# Patient Record
Sex: Female | Born: 1985 | Race: Black or African American | Hispanic: No | Marital: Married
Health system: Midwestern US, Academic
[De-identification: ages and names within clinical notes are randomized; demographics above are authoritative.]

## PROBLEM LIST (undated history)

## (undated) DIAGNOSIS — E119 Type 2 diabetes mellitus without complications: Secondary | ICD-10-CM

## (undated) DIAGNOSIS — Z9289 Personal history of other medical treatment: Secondary | ICD-10-CM

## (undated) DIAGNOSIS — F329 Major depressive disorder, single episode, unspecified: Secondary | ICD-10-CM

## (undated) DIAGNOSIS — D649 Anemia, unspecified: Secondary | ICD-10-CM

## (undated) DIAGNOSIS — Z8719 Personal history of other diseases of the digestive system: Secondary | ICD-10-CM

## (undated) DIAGNOSIS — J45909 Unspecified asthma, uncomplicated: Secondary | ICD-10-CM

## (undated) DIAGNOSIS — F32A Depression, unspecified: Secondary | ICD-10-CM

## (undated) DIAGNOSIS — F3111 Bipolar disorder, current episode manic without psychotic features, mild: Secondary | ICD-10-CM

## (undated) DIAGNOSIS — K219 Gastro-esophageal reflux disease without esophagitis: Secondary | ICD-10-CM

## (undated) DIAGNOSIS — F419 Anxiety disorder, unspecified: Secondary | ICD-10-CM

## (undated) LAB — HM PAP SMEAR: HM Pap smear: NORMAL

---

## 2010-07-30 HISTORY — PX: TUBAL LIGATION: SHX77

## 2012-02-04 ENCOUNTER — Inpatient Hospital Stay
Admit: 2012-02-04 | Discharge: 2012-02-04 | Disposition: A | Discharge status: Home / Self Care | Payer: PRIVATE HEALTH INSURANCE

## 2012-02-04 DIAGNOSIS — L509 Urticaria, unspecified: Secondary | ICD-10-CM

## 2012-02-04 MED ORDER — hydrOXYzine (ATARAX) 25 MG tablet
25 | ORAL_TABLET | ORAL | Status: AC | PRN
Start: 2012-02-04 — End: 2012-04-24

## 2012-02-04 MED ORDER — methylPREDNISolone (MEDROL, PAK,) 4 mg tablet
4 | PACK | ORAL | Status: AC
Start: 2012-02-04 — End: 2012-04-24

## 2012-02-04 MED ORDER — albuterol (PROVENTIL HFA) 90 mcg/actuation inhaler
90 | RESPIRATORY_TRACT | Status: AC | PRN
Start: 2012-02-04 — End: 2012-06-11

## 2012-02-04 NOTE — Unmapped (Signed)
Plan  Follow up with your PCP for re-evaluation as arranged. Take the medications as prescribed until gone. Read the handout provided. Return for any worsening symptoms.     Hives  (Urticaria)  Hives (urticaria) are itchy, red, swollen patches on the skin. They may change size, shape, and location quickly and repeatedly. Hives that occur deeper in the skin can cause swelling of the hands, feet, and face. Hives may be an allergic reaction to something you or your child ate, touched, or put on the skin. Hives can also be a reaction to cold, heat, viral infections, medication, insect bites, or emotional stress. Often the cause is hard to find. Hives can come and go for several days to several weeks. Hives are not contagious.  HOME CARE INSTRUCTIONS:  ?? If the cause of the hives is known, avoid exposure to that source.   ?? To relieve itching and rash:   ?? Apply cold compresses to the skin or take cool water baths. Do not take or give your child hot baths or showers because the warmth will make the itching worse.   ?? The best medicine for hives is an antihistamine. An antihistamine will not cure hives, but it will reduce their severity. You can use an antihistamine available over the counter, such as Benadryl??. This medicine may make your child sleepy. Teenagers should not drive while using this medicine.   ?? Take or give Benadryl?? every 6 hours until the hives are completely gone for 24 hours or as directed.   ?? Your child may have other medications prescribed for itching. Give these as directed by your child's physician.   ?? You or your child should wear loose fitting clothing, including undergarments. Skin irritations may make hives worse.   ?? Follow-up as provided by your caregiver.   SEEK MEDICAL CARE IF:  ?? You or your child still have considerable itching after taking the medication (prescribed or purchased over the counter).   ?? An oral temperature above 102 develops.   ?? Joint swelling or pain occurs.   SEEK  IMMEDIATE MEDICAL CARE IF:  ?? Swollen lips or tongue are noticed.   ?? There is difficulty with breathing, swallowing, or tightness in the throat or chest.   ?? Abdominal (belly) pain develops.   ?? Your child starts acting very sick.   These may be the first signs of a life-threatening allergic reaction. THIS IS AN EMERGENCY. Call 911 for medical help.  MAKE SURE YOU:   ?? Understand these instructions.   ?? Will watch your condition.   ?? Will get help right away if you are not doing well or get worse.   Document Released: 07/16/2005 Document Re-Released: 06/28/2008  ExitCare?? Patient Information ??2012 [place], [place].

## 2012-02-04 NOTE — Unmapped (Signed)
ED Attending Attestation Note    This patient was seen by the mid-level provider.  I have seen and examined the patient, agree with the workup, evaluation, management and diagnosis.  The care plan has been discussed and I concur.     Assessment reveals  Nodular areas of tenderness and redness, minimal warmth, excoriated.  Clear lungs bilaterally.

## 2012-02-04 NOTE — Unmapped (Signed)
Catherine Dominguez  eMERGENCY dEPARTMENT eNCOUnter        Chief Complaint   Shortness of Breath and Insect Bite      Nursing Notes, Past Medical Hx, Past Surgical Hx, Social Hx, Allergies, and Family Hx were all reviewed and agreed with, or any disagreements were addressed in the HPI.    History of Present Illness       Catherine Dominguez is a 26 y.o. female  She presents presents here today with a chief complaint of spider bite.  Patient stated she was on the floor last evening watching TV when she felt as though she got bit on multiple spots by some sort of insect which she believes was a spider.  Patient stated she woke up this morning and had a significant amount of pruritus on the right triceps, right wrist, and right lower extremity.  She has been scratching the area throughout the day.  She has had significant pruritus as stated above.  She has noticed some spread of one of the areas.  Otherwise she does not endorse run a fever.  She has never had a reaction like this.  She denies any new change in detergents, or environmental exposures.  She also has had a little bit of increase in shortness of breath.  She does have a history of asthma and has recently ran out of her albuterol inhaler.  She stated that minute changes in the environment will trigger asthma spell.  She does have followup with her PCP 1 week from today.    Past Medical History   Diagnosis Date   ??? Anxiety    ??? Asthma        No family history on file.    Past Surgical History   Procedure Date   ??? Tubal ligation        Review of Systems     Review of Systems   Constitutional: Negative for fever and chills.   HENT: Negative for neck pain and neck stiffness.    Eyes: Negative for visual disturbance.   Respiratory: Negative for cough and shortness of breath.    Cardiovascular: Negative for chest pain.   Gastrointestinal: Negative for nausea, vomiting, abdominal pain and diarrhea.   Genitourinary: Negative for flank pain.   Skin: Positive for rash.      Neurological: Negative for dizziness and headaches.   All other systems reviewed and are negative.          Medications     Previous Medications    ALBUTEROL (PROVENTIL,VENTOLIN) 90 MCG/ACTUATION INHALER    Inhale 2 puffs into the lungs every 4 hours as needed.    ESCITALOPRAM (LEXAPRO) 20 MG TABLET    Take 20 mg by mouth daily.       Allergies     She  has no known allergies.    Physical Exam      INITIAL VITALS: BP 111/72   Pulse 80   Temp 97.8 ??F (36.6 ??C) (Oral)   Resp 14   Ht 4' 11 (1.499 m)   Wt 194 lb (87.998 kg)   BMI 39.18 kg/m2   SpO2 96%   LMP 01/24/2012   Physical Exam   Constitutional: She is oriented to person, place, and time. She appears well-developed and well-nourished.   HENT:   Head: Normocephalic and atraumatic.   Eyes: Conjunctivae normal are normal.   Neck: Normal range of motion.   Neurological: She is alert and oriented to person, place, and time.  Skin: Skin is warm and dry.        Patient has an area of urticaria on the right triceps that has an erythema of about 5 cm in an oblong oval-shaped pattern.  There was minimal induration but no fluctuance.  Minimal warmth but I do believe secondary to coordination.  Area of the last-like lesions on the right forearm.  No surrounding erythema.  She also had an area very similar with hive-like: Since on the right lower extremity on the anterior shin.  No surrounding erythema.   Psychiatric: She has a normal mood and affect.         Diagnostic Results     EKG   None.    RADIOLOGY:  None    LABS:   Labs Reviewed - No data to display    RECENT VITALS:  BP: 111/72 mmHg, Temp: 97.8 ??F (36.6 ??C), Heart Rate: 80 , Resp: 14      ED Course       The patient was given the following medications:  No orders of the defined types were placed in this encounter.         Medical Decision Making      Catherine Dominguez is a 26 y.o. female presents today for evaluation of the chief complaint above.  At this time the patient has evidence of urticaria present on  the areas listed above.  There was some surrounding redness but did not appear to be cellulitic.  With the distribution being as stated above, I do not believe the patient has multiple sites of cellulitis.  There is also a pruritic component to this.  I do believe she has been exposed to some sort of specimen that has caused this urticaria.  She has a followup with her PCP next week.  I will highly encourage reevaluation if symptoms to consider she may need to see dermatology.  Otherwise I will treat her with a course of steroids.  If she develops any worsening symptoms she has been instructed to return.  At this time she will be discharged home in good condition.  Her vital signs have remained stable.  There was no involvement of the intraoral cavity and there has been no airway compromise.     The patient was seen and examined by myself and the attending physician who agrees with the assessment and plan.  Patient was given adequate time to ask questions and does verbalize agreement with the treatment plan.  Disposition     CLINICAL IMPRESSION:  1. Urticaria        PATIENT REFERRED TO:            DISCHARGE MEDICATIONS:  New Prescriptions    HYDROXYZINE (ATARAX) 25 MG TABLET    Take 1 tablet (25 mg total) by mouth every 3 hours as needed for Itching.    METHYLPREDNISOLONE (MEDROL, PAK,) 4 MG TABLET    follow package directions       PLAN:  Follow up with your PCP for re-evaluation as arranged. Take the medications as prescribed until gone. Read the handout provided. Return for any worsening symptoms.     DISPOSITION:  Discharged in good condition.      Catherine Dominguez  [facility]      Catherine Dominguez, [place]  02/04/12 [phone]

## 2012-02-04 NOTE — Unmapped (Signed)
PA at bedside.

## 2012-02-04 NOTE — Unmapped (Signed)
Pt with worsen sob x 1 week.  ? Insect bite to right arm and led last night. +redness, +drainage

## 2012-04-24 ENCOUNTER — Ambulatory Visit: Admit: 2012-04-24 | Discharge: 2012-04-24 | Payer: PRIVATE HEALTH INSURANCE | Attending: Adult Health

## 2012-04-24 DIAGNOSIS — R7309 Other abnormal glucose: Secondary | ICD-10-CM

## 2012-04-24 DIAGNOSIS — J45909 Unspecified asthma, uncomplicated: Secondary | ICD-10-CM | POA: Insufficient documentation

## 2012-04-24 LAB — COMPREHENSIVE METABOLIC PANEL, SERUM
ALT: 33 U/L (ref 6–40)
AST (SGOT): 26 U/L (ref 14–36)
Albumin: 4 g/dL (ref 3.6–5.1)
Alkaline Phosphatase: 71 U/L (ref 33–115)
Anion Gap: 11 mmol/L (ref 3–16)
BUN: 11 mg/dL (ref 7–25)
CO2: 27 mmol/L (ref 21–33)
Calcium: 9.3 mg/dL (ref 8.6–10.2)
Chloride: 103 mmol/L (ref 98–110)
Creatinine: 0.78 mg/dL (ref 0.50–1.20)
GFR MDRD Af Amer: 108 See note.
GFR MDRD Non Af Amer: 89 See note.
Glucose: 101 mg/dL (ref 65–99)
Osmolality, Calculated: 292 mOsm/kg (ref 278–305)
Potassium: 4 mmol/L (ref 3.5–5.3)
Sodium: 141 mmol/L (ref 135–146)
Total Bilirubin: 0.2 mg/dL (ref 0.2–1.2)
Total Protein: 7 g/dL (ref 6.2–8.3)

## 2012-04-24 LAB — DIFFERENTIAL
Basophils Absolute: 30 /uL (ref 0–200)
Basophils Relative: 0.3 % (ref 0.0–1.0)
Eosinophils Absolute: 40 /uL (ref 15–500)
Eosinophils Relative: 0.4 % (ref 0.0–8.0)
Lymphocytes Absolute: 1757 /uL (ref 850–3900)
Lymphocytes Relative: 17.4 % (ref 15.0–45.0)
Monocytes Absolute: 323 /uL (ref 200–950)
Monocytes Relative: 3.2 % (ref 0.0–12.0)
Neutrophils Absolute: 7949 /uL (ref 1500–7800)
Neutrophils Relative: 78.7 % (ref 40.0–80.0)

## 2012-04-24 LAB — CBC
Hematocrit: 32.4 % (ref 35.0–45.0)
Hemoglobin: 9.8 g/dL (ref 11.7–15.5)
MCH: 21 pg (ref 27.0–33.0)
MCHC: 30.3 g/dL (ref 32.0–36.0)
MCV: 69.3 fL (ref 80.0–100.0)
MPV: 8.7 fL (ref 7.5–11.5)
Platelets: 530 10*3/uL (ref 140–400)
RBC: 4.68 10*6/uL (ref 3.80–5.10)
RDW: 21 % (ref 11.0–15.0)
WBC: 10.1 10*3/uL (ref 3.8–10.8)

## 2012-04-24 LAB — LIPID PANEL
Cholesterol, Total: 202 mg/dL (ref 0–200)
HDL: 43 mg/dL (ref 30–60)
LDL Cholesterol: 128 mg/dL (ref 0–160)
Triglycerides: 153 mg/dL (ref 10–150)

## 2012-04-24 LAB — VITAMIN D 25 HYDROXY: Vit D, 25-Hydroxy: 7.3 ng/mL — ABNORMAL LOW (ref 30.0–100)

## 2012-04-24 LAB — HEMOGLOBIN A1C: Hemoglobin A1C: 5.6 % (ref 4.8–6.4)

## 2012-04-24 MED ORDER — escitalopram (LEXAPRO) 20 MG tablet
20 | ORAL_TABLET | Freq: Every day | ORAL | 1.00 refills | 30.00000 days | Status: AC
Start: 2012-04-24 — End: 2012-11-17

## 2012-04-24 MED ORDER — parab-cety&stea alc-pro gl-sls (CETAPHIL) Clsr top cleanser
TOPICAL | 1.00 refills | 30.00000 days | Status: AC
Start: 2012-04-24 — End: 2013-05-04

## 2012-04-24 NOTE — Unmapped (Addendum)
Try Cetaphil lotion for dry skin      Continue current medication regime until lab results have been reviewed.  We will call you with the results and any changes in your medications if needed.

## 2012-04-24 NOTE — Unmapped (Signed)
Catherine Dominguez is a 26 y.o. female here for a physical exam and to establish care.  Recently relocated from the [place].  Reports family sleeps on the floor, no beds or other furniture.   CURRENT COMPLAINTS:     Eczema, asthma, anxiety and concern about blood glucose levels  CHRONIC MEDICAL PROBLEMS:  Patient Active Problem List   Diagnosis   ??? Asthma   ??? Eczema     HEALTH MAINTENANCE:  Reports up to date with gyn    Other Providers:   none  Past medical history, social history and family history reviewed and updated.    Current Outpatient Prescriptions   Medication Sig   ??? albuterol Inhale 2 puffs into the lungs every 4 hours as needed for Wheezing or Shortness of Breath.   ??? albuterol Inhale 2 puffs into the lungs every 4 hours as needed.   ??? fluticasone-salmeterol Inhale 1 puff into the lungs every 12 (twelve) hours.   ??? lansoprazole Take 15 mg by mouth every morning before breakfast.   ??? DISCONTD: escitalopram Take 20 mg by mouth daily.   ??? escitalopram Take 1 tablet (20 mg total) by mouth daily.   ??? parab-cety&stea alc-pro gl-sls Apply thin coat to affected areas three times a day   ??? DISCONTD: hydrOXYzine Take 1 tablet (25 mg total) by mouth every 3 hours as needed for Itching.   ??? DISCONTD: methylPREDNISolone follow package directions         History   Substance Use Topics   ??? Smoking status: Never Smoker    ??? Smokeless tobacco: Never Used   ??? Alcohol Use: No       ROS: No unusual headaches, visual disturbances or allergy symptoms.  No lymphadenopathy, fever or chills.  No dysphagia.  No prolonged cough, hemoptysis, wheezing or SOB. Reports asthma control with Adviar and albuterol.   No dyspnea, palpitations .or angina-like chest pain.  No orthopnea or PND.  No abdominal pain, changes in bowel habits, black or bloody stools.  No urinary tract symptoms.  Denies TIA-like symptoms  No new or unusual musculoskeletal  symptoms. Denies mastalgia, nipple discharge, dimpling or masses.  No asymmetry.  Denies  changes in menstrual pattern, flow, skipped periods, vaginal discharge or painful intercourse. Chronic eczema.  No skin rashes, mouth sores or troubling skin lesions.  No suicidal or homicidal ideations.  Current medications are listed above.  She denies any side effects from the medication.      Physical Exam:  Blood pressure 129/85, pulse 77, weight 202 lb (91.627 kg).    Weight: 202 lb (91.627 kg)   There is no height on file to calculate BMI.    Constitutional: Oriented to person, place and time.  Appears well-developed and well-nourished in no acute distress.  HENT:  Head:normocephalic and atraumatic  Ears:  Hearing, tympanic membranes, external ear and ear canal normal and intact.  Nose:  Normal  Mouth/Throat:  Uvula is midline, oropharynx is clear and moist  Mucous membranes normal.  Eyes:  Conjunctivae non-injected and EOMs intact.  PERRL.  No scleral icterus.  Neck: Normal range of motion.  Neck supple.  Normal carotid pulses and no JVD present.  No carotid bruits appreciated.  No tracheal deviation.  No mass and no thyromegaly.  Cardiovascular:  Normal rate, regular rhythm, S1:S2, No MGR.  Intact distal pulses.  No peripheral edema noted.  Pulmonary/Chest:  Effort normal.  No stridor.  No respiratory distress.  No wheezes, rhonchi or crackles.  No chest wall tenderness to light palpation.  Abdominal:  Soft, nontender, negative organomegaly.  No distension, rebound tenderness or guarding.  No masses or abdominal bruits.  No ventral or umbilical hernia appreciated.  Musculoskeletal:  Normal posture and balance.  Full ROM without soft tissue swelling, pain or crepitus.  Lymphadenopathy:  No cervical, axillary, supraclavicular or inguinal lymphadenopathy.  Neurological:  Alert and oriented to person, place and time.  Normal reflexes.  No tremor.  CN II-XII grossly intact.  Normal muscle tone.  Coordination and gait normal. Cerebellar function is intact, finger to nose is bilaterally symmetric.  Romberg is  negative.  Skin:  Skin is warm and dry.  No rash noted.  No cyanosis or pallor.  No worrisome skin lesions. Nails show no clubbing.  Psychiatric:  Normal mood and affect.  Speech is normal and behavior is normal.  Cognition and memory are normal.    Catherine Dominguez was seen today for new patient visit/ consultation.    Diagnoses and associated orders for this visit:    Elevated random blood glucose level  - Hemoglobin A1c  - Comprehensive Metabolic Panel, Serum  - CBC  - Differential    Asthma    Eczema  - parab-cety&stea alc-pro gl-sls (CETAPHIL) Clsr top cleanser; Apply thin coat to affected areas three times a day    Osteomalacia  - Vitamin D 25 hydroxy    Hyperlipidemia  - Lipid Profile    Anxiety  - escitalopram (LEXAPRO) 20 MG tablet; Take 1 tablet (20 mg total) by mouth daily.    Counseling nos  - AMB REFERRAL TO SOCIAL WORK    Other Orders  - fluticasone-salmeterol (ADVAIR) 250-50 mcg/dose diskus inhaler; Inhale 1 puff into the lungs every 12 (twelve) hours.  - lansoprazole (PREVACID) 15 MG capsule; Take 15 mg by mouth every morning before breakfast.      Have placed a social work referral to assist with her needs such as beds and other furniture.  See orders, patient instructions and/or after visit summary.      Age-appropriate preventative issues discussed and handout given.  See orders, patient instructions and/or after visit summary.

## 2012-04-25 MED ORDER — cholecalciferol, vitamin D3, 50,000 unit capsule
1250 | ORAL_CAPSULE | ORAL | Status: AC
Start: 2012-04-25 — End: 2012-11-17

## 2012-04-28 NOTE — Unmapped (Signed)
Patient returning call to Rosann Auerbach

## 2012-06-11 MED ORDER — albuterol (PROVENTIL HFA) 90 mcg/actuation inhaler
90 | RESPIRATORY_TRACT | Status: AC | PRN
Start: 2012-06-11 — End: 2012-12-23

## 2012-06-11 NOTE — Unmapped (Signed)
Pt called and stated that she had run out of the pro air, can this please be refilled?    [employer]  [phone]

## 2012-06-11 NOTE — Unmapped (Signed)
Phoned to pharmacy voicemail.

## 2012-06-17 ENCOUNTER — Encounter: Payer: PRIVATE HEALTH INSURANCE | Attending: Adult Health

## 2012-06-19 ENCOUNTER — Ambulatory Visit: Admit: 2012-06-19 | Discharge: 2012-06-19 | Payer: PRIVATE HEALTH INSURANCE | Attending: Adult Health

## 2012-06-19 DIAGNOSIS — R591 Generalized enlarged lymph nodes: Secondary | ICD-10-CM

## 2012-06-19 MED ORDER — cephALEXin (KEFLEX) 500 MG capsule
500 | ORAL_CAPSULE | Freq: Four times a day (QID) | ORAL | Status: AC
Start: 2012-06-19 — End: 2012-11-17

## 2012-06-19 NOTE — Unmapped (Signed)
We will call you with the results of the testing.

## 2012-06-19 NOTE — Unmapped (Signed)
Subjective  HPI:   Patient ID: Catherine Dominguez is a 26 y.o. female.    Chief Complaint:  HPI       She presents today for her annual gyn exam as well as with a several year history of axillary lymphadenopathy she attributes to using deodorant.  Reports a PAP smear about 18 months ago.  Says she has a PH of abnormal PAPs but the last one was fine    Medications:  Current Outpatient Prescriptions   Medication Sig Dispense Refill   ??? albuterol (PROVENTIL HFA) 90 mcg/actuation inhaler Inhale 2 puffs into the lungs every 4 hours as needed for Wheezing or Shortness of Breath.  1 Inhaler  0   ??? cholecalciferol, vitamin D3, 50,000 unit capsule Take 1 capsule (50,000 Units total) by mouth once a week. Take one capsule a week for 3 months.  12 capsule  0   ??? escitalopram (LEXAPRO) 20 MG tablet Take 1 tablet (20 mg total) by mouth daily.  90 tablet  1   ??? fluticasone-salmeterol (ADVAIR) 250-50 mcg/dose diskus inhaler Inhale 1 puff into the lungs every 12 (twelve) hours.       ??? lansoprazole (PREVACID) 15 MG capsule Take 15 mg by mouth every morning before breakfast.       ??? parab-cety&stea alc-pro gl-sls (CETAPHIL) Clsr top cleanser Apply thin coat to affected areas three times a day  480 mL  5   ??? cephALEXin (KEFLEX) 500 MG capsule Take 1 capsule (500 mg total) by mouth 4 times daily before meals and at bedtime.  40 capsule  0   ??? [DISCONTINUED] albuterol (PROVENTIL,VENTOLIN) 90 mcg/actuation inhaler Inhale 2 puffs into the lungs every 4 hours as needed.         No current facility-administered medications for this visit.        ROS:   Review of Systems   Constitutional: Negative for activity change and appetite change.   Respiratory: Negative for cough, shortness of breath and wheezing.    Cardiovascular: Negative for chest pain, palpitations and leg swelling.   Genitourinary: Negative for vaginal bleeding, vaginal discharge, difficulty urinating, genital sores, vaginal pain and pelvic pain.   Hematological: Positive for  adenopathy.          Objective:   Physical Exam   Nursing note and vitals reviewed.  Constitutional: She appears well-developed and well-nourished.   Neck: No thyromegaly present.   Cardiovascular: Normal rate and regular rhythm.    Pulmonary/Chest: Effort normal and breath sounds normal.   Abdominal: Soft. Bowel sounds are normal. She exhibits no distension and no mass. There is no tenderness. There is no rebound and no guarding.   Genitourinary: Vagina normal and uterus normal. No vaginal discharge found.   Genitourinary:  Vaginal normal and uterus present  There are no labial lesions.  Cervix exhibits no motion tenderness, no discharge or friability.  Rectum without masses in rectal vault.  No hemorrhoids or anal fissures. Normal sphincter tone    Breasts:  No tenderness, skin changes, nipple discharge or dominant mass bilaterally.       Lymphadenopathy:   Two 3 mm easily mobile tender areas in left axillary region.  No other nodules/lymphadenopathy noted.  Reports occur after shaves and uses deodorant.             Filed Vitals:    06/19/12 0939   BP: 110/80   Pulse: 81   Height: 4' 11 (1.499 m)   Weight: 194 lb (87.998 kg)  Body mass index is 39.16 kg/(m^2).  Body surface area is 1.91 meters squared.                Assessment/Plan:     Patient Active Problem List   Diagnosis   ??? Asthma   ??? Eczema        Catherine Dominguez was seen today for gynecologic exam and lymphadenopathy.    Diagnoses and associated orders for this visit:    Lymphadenopathy  - cephALEXin (KEFLEX) 500 MG capsule; Take 1 capsule (500 mg total) by mouth 4 times daily before meals and at bedtime.    Screening for cervical cancer  - Cytology - PAP Test, Routine Scrn        .

## 2012-06-30 NOTE — Unmapped (Signed)
Message copied by Lorene Dy on Mon Jun 30, 2012 11:39 AM  ------       Message from: [place], ANN       Created: Mon Jun 30, 2012 11:05 AM         Normal pap  ------

## 2012-06-30 NOTE — Unmapped (Signed)
Patient notified of normal pap.

## 2012-07-21 NOTE — Unmapped (Signed)
Please send to [facility] on [address], she needs something for a yeast infection and her pro air inhaler.

## 2012-07-22 NOTE — Unmapped (Signed)
Per 07/21/12 phone call - Pt needs a refill for Pro Air - No Med Rec Hx of specifically Pro Air. Advair and Proventil HFA in hx- ASB

## 2012-07-22 NOTE — Unmapped (Signed)
Phoned in to the pharmacy RX x2. - ASB

## 2012-07-22 NOTE — Unmapped (Signed)
Ok to fill ProAir  #1 NR,  Sig:  2 puffs qid prn for wheezing or SOB    Diflucan 150 mg x1  May refill x1 if symptomatic after 2 days.

## 2012-07-22 NOTE — Unmapped (Signed)
Ann:  Please advise.  As well, a separate note was sent regarding the [employer] - ASB

## 2012-09-19 ENCOUNTER — Ambulatory Visit: Payer: PRIVATE HEALTH INSURANCE | Attending: Adult Health

## 2012-09-26 ENCOUNTER — Ambulatory Visit: Admit: 2012-09-26 | Discharge: 2012-09-26 | Payer: PRIVATE HEALTH INSURANCE | Attending: Adult Health

## 2012-09-26 DIAGNOSIS — J45909 Unspecified asthma, uncomplicated: Secondary | ICD-10-CM

## 2012-09-26 MED ORDER — fluticasone-salmeterol (ADVAIR) 250-50 mcg/dose diskus inhaler
250-50 | Freq: Two times a day (BID) | RESPIRATORY_TRACT | 2.00 refills | 30.00000 days | Status: AC
Start: 2012-09-26 — End: 2013-05-04

## 2012-09-26 NOTE — Unmapped (Signed)
Subjective  HPI:   Patient ID: Catherine Dominguez is a 27 y.o. female. Here for follow up of left axillary nodules, asthma and gerd.  Chief Complaint:  HPI       Reports GERD is worse at night despite compliance with Prevacid.  Also notes that her asthma is worse at night.  Needing rescue inhaler 3-4 times a day now.    A course of cephalexin did not improve the painful nodules left axillary region.  Says that the area rarely drains.    Medications:  Current Outpatient Prescriptions   Medication Sig Dispense Refill   ??? albuterol (PROVENTIL HFA) 90 mcg/actuation inhaler Inhale 2 puffs into the lungs every 4 hours as needed for Wheezing or Shortness of Breath.  1 Inhaler  0   ??? cholecalciferol, vitamin D3, 50,000 unit capsule Take 1 capsule (50,000 Units total) by mouth once a week. Take one capsule a week for 3 months.  12 capsule  0   ??? escitalopram (LEXAPRO) 20 MG tablet Take 1 tablet (20 mg total) by mouth daily.  90 tablet  1   ??? fluticasone-salmeterol (ADVAIR) 250-50 mcg/dose diskus inhaler Inhale 1 puff into the lungs 2 times a day.  1 Inhaler  5   ??? lansoprazole (PREVACID) 15 MG capsule Take 2 capsules (30 mg total) by mouth 2 times a day.  60 capsule  5   ??? parab-cety&stea alc-pro gl-sls (CETAPHIL) Clsr top cleanser Apply thin coat to affected areas three times a day  480 mL  5   ??? [DISCONTINUED] fluticasone-salmeterol (ADVAIR) 250-50 mcg/dose diskus inhaler Inhale 1 puff into the lungs every 12 (twelve) hours.       ??? [DISCONTINUED] lansoprazole (PREVACID) 15 MG capsule Take 15 mg by mouth every morning before breakfast.       ??? cephALEXin (KEFLEX) 500 MG capsule Take 1 capsule (500 mg total) by mouth 4 times daily before meals and at bedtime.  40 capsule  0     No current facility-administered medications for this visit.        ROS:   Review of Systems   Constitutional: Negative for fever, chills, activity change and appetite change.   Respiratory: Positive for cough, shortness of breath and wheezing.       Cardiovascular: Negative for chest pain, palpitations and leg swelling.   Gastrointestinal: Negative for nausea and vomiting.   Hematological: Positive for adenopathy.     See HPI       Objective:   Physical Exam   Nursing note and vitals reviewed.  Constitutional: She appears well-developed and well-nourished.   HENT:   Head: Normocephalic and atraumatic.   Cardiovascular: Normal rate, regular rhythm, normal heart sounds and intact distal pulses.    Pulmonary/Chest: Effort normal and breath sounds normal. She has no wheezes. She has no rales.   Abdominal: Soft. Bowel sounds are normal.   Skin: Skin is warm and dry. No rash noted.   Two freely movable nodules left axillary region.  Tender to light palpation.              Filed Vitals:    09/26/12 0841   BP: 118/76   Pulse: 80   Height: 4' 11 (1.499 m)   Weight: 196 lb 3.2 oz (88.996 kg)     Body mass index is 39.61 kg/(m^2).  Body surface area is 1.93 meters squared.                Assessment/Plan:  Patient Active Problem List   Diagnosis   ??? Asthma   ??? Eczema   ??? GERD (gastroesophageal reflux disease)        Catherine Dominguez was seen today for follow-up.    Diagnoses and associated orders for this visit:    Asthma  - fluticasone-salmeterol (ADVAIR) 250-50 mcg/dose diskus inhaler; Inhale 1 puff into the lungs 2 times a day.    Mass  - Amb referral to Dermatology    GERD (gastroesophageal reflux disease)    Other Orders  - lansoprazole (PREVACID) 15 MG capsule; Take 2 capsules (30 mg total) by mouth 2 times a day.      Have increased her Prevacid to bid. GERD may be aggravating her asthma.  Added Advair to her current regime.  She will FU in 1 month to discuss weight loss

## 2012-09-26 NOTE — Unmapped (Addendum)
We will schedule with dermatology    After you use the Advair inhaler, rinse mouth with warm water and spit    Have increased Prevacid to one in the morning and one at bedtime

## 2012-10-09 MED ORDER — fluconazole (DIFLUCAN) 150 MG tablet
150 | ORAL_TABLET | ORAL | Status: AC
Start: 2012-10-09 — End: 2012-11-17

## 2012-10-09 NOTE — Unmapped (Signed)
Patient is wanting to know if she should to to urgent care or could the office call something in for a yeast infection. Itching, discharge is white like cottage. No fever. Started itching today, couple days with discharge. [facility] [phone].

## 2012-10-09 NOTE — Unmapped (Signed)
Diflucan ordered

## 2012-10-27 ENCOUNTER — Ambulatory Visit: Admit: 2012-10-27 | Discharge: 2012-10-27 | Payer: PRIVATE HEALTH INSURANCE | Attending: Family

## 2012-10-27 DIAGNOSIS — L209 Atopic dermatitis, unspecified: Secondary | ICD-10-CM

## 2012-10-27 MED ORDER — triamcinolone (KENALOG) 0.1 % ointment
0.1 | Freq: Two times a day (BID) | TOPICAL | 0.00 refills | 1.00000 days | Status: AC
Start: 2012-10-27 — End: 2013-10-22

## 2012-10-27 NOTE — Unmapped (Signed)
Chief Complaint   Patient presents with   ??? Rash     rash on arms,chest and shoulders, PCP treated as eczema   ??? Skin Lesion     lesions lt axilla x 4mos       HPI: Pt is 27 y.o.AA female here for:    1. Lifelong history of waxing and waning moderately pruritic eczema. Has used cetaphil in the past with some improvement. Flares more in winter months. Has rash on arms today.     2. Lt axilla with waxing and waning tender draining lesions x 4 months. Was treated with Keflex in the past with minimal improvement. No known triggers.     ROS: Denies fever and chills. Denies seasonal allergies.     Derm History: NPV (referral from Rogene Houston CNP- letter sent)  (- ) Family history of melanoma  (- ) Personal history of NMSC or MM  (- ) sunburns easily  (- ) uses sunscreen  (- ) history of tanning bed use    Physical Examination: Limited     Examination was performed of the following were unremarkable: head/face, conjunctivae/eyelids, gums/teeth/lips and neck    Abnormalities noted include: breast/axilla/chest, RUE and LUE    1. Lt AC and rt dorsal shoulder with ill defined brown scaly patches  2. Lt axilla with skin colored tunnelled plaques, no erythema and no drainage    A & P:    1. Atopic dermatitis  Plan;  -educated on diagnosis  -gentle skin care education provided  -pamphlet on diagnosis provided  -TAC 01.% oint bid prn. Limit use to 2-3 weeks at a time to avoid lightening/thinning of skin, atrophy, tachyphylaxis.     2. 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

## 2012-11-17 ENCOUNTER — Inpatient Hospital Stay: Admit: 2012-11-17 | Payer: PRIVATE HEALTH INSURANCE | Attending: Adult Health

## 2012-11-17 ENCOUNTER — Ambulatory Visit: Admit: 2012-11-17 | Discharge: 2012-11-17 | Payer: PRIVATE HEALTH INSURANCE | Attending: Adult Health

## 2012-11-17 DIAGNOSIS — D649 Anemia, unspecified: Secondary | ICD-10-CM

## 2012-11-17 LAB — CBC
Hematocrit: 33 % — ABNORMAL LOW (ref 35.0–45.0)
Hemoglobin: 9.8 g/dL — ABNORMAL LOW (ref 11.7–15.5)
MCH: 20.2 pg — ABNORMAL LOW (ref 27.0–33.0)
MCHC: 29.8 g/dL — ABNORMAL LOW (ref 32.0–36.0)
MCV: 67.8 fL — ABNORMAL LOW (ref 80.0–100.0)
MPV: 8.2 fL (ref 7.5–11.5)
Platelets: 610 10E3/uL — ABNORMAL HIGH (ref 140–400)
RBC: 4.86 10E6/uL (ref 3.80–5.10)
RDW: 20 % — ABNORMAL HIGH (ref 11.0–15.0)
WBC: 7.6 10E3/uL (ref 3.8–10.8)

## 2012-11-17 LAB — FERRITIN: Ferritin: 5 ng/mL — ABNORMAL LOW (ref 6.0–137.0)

## 2012-11-17 LAB — IRON STUDIES
% Iron Saturation: 8 % — ABNORMAL LOW (ref 15.0–55.0)
Iron: 34 ug/dL — ABNORMAL LOW (ref 40–175)
TIBC: 425 ug/dL (ref 265–497)

## 2012-11-17 LAB — VITAMIN D 25 HYDROXY: Vit D, 25-Hydroxy: 21.2 ng/mL (ref 30.0–100)

## 2012-11-17 MED ORDER — escitalopram oxalate (LEXAPRO) 20 MG tablet
20 | ORAL_TABLET | Freq: Every day | ORAL | 1.00 refills | 30.00000 days | Status: AC
Start: 2012-11-17 — End: 2013-04-30

## 2012-11-17 MED ORDER — topiramate (TOPAMAX) 25 MG tablet
25 | ORAL_TABLET | Freq: Two times a day (BID) | ORAL | Status: AC
Start: 2012-11-17 — End: 2013-10-22

## 2012-11-17 NOTE — Unmapped (Signed)
Begin taking the Topamax at 1 tablet at bedtime for one week then increase to one twice a day.      Continue current medication regime until lab results have been reviewed.  We will call you with the results and any changes in your medications if needed.

## 2012-11-17 NOTE — Unmapped (Signed)
Subjective  HPI:   Patient ID: Catherine Dominguez is a 27 y.o. female here for followup of her multiple medical problems.    Chief Complaint:  HPI       Anemia:  She has not had her additional labs done yet.  Continues to complain of fatigue  Headache:  Experiences migraines about twice a month.  Difficulty dealing with her children when she has a headache.  Anxiety-Lexapro helpful.  Requesting a refill  Vitamin D deficiency-finished the 3 month course of Vitamin D 50,000 IU weekly.  Due follow up blood work    Medications:  Current Outpatient Prescriptions   Medication Sig Dispense Refill   ??? albuterol (PROVENTIL HFA) 90 mcg/actuation inhaler Inhale 2 puffs into the lungs every 4 hours as needed for Wheezing or Shortness of Breath.  1 Inhaler  0   ??? escitalopram (LEXAPRO) 20 MG tablet Take 1 tablet (20 mg total) by mouth daily.  90 tablet  1   ??? fluticasone-salmeterol (ADVAIR) 250-50 mcg/dose diskus inhaler Inhale 1 puff into the lungs 2 times a day.  1 Inhaler  5   ??? lansoprazole (PREVACID) 15 MG capsule Take 2 capsules (30 mg total) by mouth 2 times a day.  60 capsule  5   ??? parab-cety&stea alc-pro gl-sls (CETAPHIL) Clsr top cleanser Apply thin coat to affected areas three times a day  480 mL  5   ??? triamcinolone (KENALOG) 0.1 % ointment Apply topically 2 times a day.  80 g  0   ??? [DISCONTINUED] cephALEXin (KEFLEX) 500 MG capsule Take 1 capsule (500 mg total) by mouth 4 times daily before meals and at bedtime.  40 capsule  0   ??? [DISCONTINUED] cholecalciferol, vitamin D3, 50,000 unit capsule Take 1 capsule (50,000 Units total) by mouth once a week. Take one capsule a week for 3 months.  12 capsule  0   ??? [DISCONTINUED] fluconazole (DIFLUCAN) 150 MG tablet Take one tab by mouth today and repeat dose in 3 days for total of 2 doses.  2 tablet  0   ??? [DISCONTINUED] metoclopramide HCl (REGLAN) 5 MG tablet          No current facility-administered medications for this visit.        ROS:   Review of Systems      Constitutional: Positive for fatigue. Negative for activity change and appetite change.   Respiratory: Negative for shortness of breath.         Asthma stable at this time   Cardiovascular: Negative for chest pain, palpitations and leg swelling.   Gastrointestinal: Negative for vomiting, constipation and abdominal distention.   Genitourinary: Negative for difficulty urinating.   Neurological: Positive for headaches.     See HPI       Objective:   Physical Exam   Nursing note and vitals reviewed.  Constitutional: She appears well-developed and well-nourished.   HENT:   Head: Normocephalic and atraumatic.   Eyes: Conjunctivae are normal. Pupils are equal, round, and reactive to light.   Neck: No thyromegaly present.   Cardiovascular: Normal rate, regular rhythm, normal heart sounds and intact distal pulses.    Pulmonary/Chest: Effort normal and breath sounds normal.   Abdominal: Soft. Bowel sounds are normal.   Neurological: She is alert. She has normal reflexes.   Skin: Skin is warm and dry.                         Wt  Readings from Last 3 Encounters:   11/17/12 196 lb (88.905 kg)   09/26/12 196 lb 3.2 oz (88.996 kg)   06/19/12 194 lb (87.998 kg)       Filed Vitals:    11/17/12 0744   BP: 112/74   Pulse: 90   Weight: 196 lb (88.905 kg)     Body mass index is 39.57 kg/(m^2).  There is no height on file to calculate BSA.          Assessment/Plan:     Patient Active Problem List   Diagnosis   ??? Asthma   ??? Eczema   ??? GERD (gastroesophageal reflux disease)   ??? Atopic dermatitis   ??? Scar of skin        Catherine Dominguez was seen today for follow-up.    Diagnoses and associated orders for this visit:    Anemia  - CBC  - Fe+TIBC+Fer; Future  - Fe+TIBC+Fer    Vitamin D insufficiency  - Vitamin D 25 hydroxy    Headache  - topiramate (TOPAMAX) 25 MG tablet; Take 1 tablet (25 mg total) by mouth 2 times a day.    Anxiety  - escitalopram oxalate (LEXAPRO) 20 MG tablet; Take 1 tablet (20 mg total) by mouth daily.

## 2012-11-18 NOTE — Unmapped (Signed)
Called both phone numbers and they are non working numbers. Mailed letter

## 2012-11-18 NOTE — Unmapped (Signed)
Message copied by Britta Mccreedy on Tue Nov 18, 2012 10:50 AM  ------       Message from: Serena, [place]       Created: Tue Nov 18, 2012 10:35 AM         Let her know that she has an iron deficiency anemia and that is why she is tired.  Recommend iron supplements 3 times a day with meals.  ------

## 2012-11-24 ENCOUNTER — Encounter: Payer: PRIVATE HEALTH INSURANCE | Attending: Adult Health

## 2012-11-24 NOTE — Unmapped (Signed)
Call pt back

## 2012-11-24 NOTE — Unmapped (Signed)
Left general message for pt to call

## 2012-11-24 NOTE — Unmapped (Signed)
Per pt she needs a refill on her  and the VITAMIN D, she also need iron pills. Please advise [facility] on [address] [phone]

## 2012-11-24 NOTE — Unmapped (Signed)
Let her know that both the Vitamin D and iron recommended are over the counter.

## 2012-11-25 NOTE — Unmapped (Signed)
Pt calling back concerning the previous message.

## 2012-11-25 NOTE — Unmapped (Signed)
Pt was wanting to know if there is any way she could get the Vitamin D and Iron by prescription so that it would be less expensive for her. She states that she has had the Vitamin D prescribed before and Iron prescribe by a different provider at one point. Please let her know if this can be done.

## 2012-11-25 NOTE — Unmapped (Signed)
Spoke to the patient   Expressed verbal understanding

## 2012-11-25 NOTE — Unmapped (Signed)
Spoke to the pt   Nothing further needed at this time

## 2012-12-15 ENCOUNTER — Ambulatory Visit: Admit: 2012-12-15 | Discharge: 2012-12-15 | Payer: PRIVATE HEALTH INSURANCE | Attending: Adult Health

## 2012-12-15 DIAGNOSIS — K219 Gastro-esophageal reflux disease without esophagitis: Secondary | ICD-10-CM

## 2012-12-15 MED ORDER — esomeprazole (NEXIUM) 40 MG capsule
40 | ORAL_CAPSULE | Freq: Every morning | ORAL | Status: AC
Start: 2012-12-15 — End: 2012-12-23

## 2012-12-15 NOTE — Unmapped (Signed)
Subjective  HPI:   Patient ID: Catherine Dominguez is a 27 y.o. female.here for routine follow up of her multiple medical problems.    Chief Complaint:  HPI       Reports feeling better since starting iron supplements, Vitamin D3 and Topamax.  Denies associated symptoms from the medications.  Her asthma has been stable on current regime.  Prevacid no longer controls her GERD.  Says she has failed Omeprazole as well.  Medications:  Current Outpatient Prescriptions   Medication Sig Dispense Refill   ??? albuterol (PROVENTIL HFA) 90 mcg/actuation inhaler Inhale 2 puffs into the lungs every 4 hours as needed for Wheezing or Shortness of Breath.  1 Inhaler  0   ??? escitalopram oxalate (LEXAPRO) 20 MG tablet Take 1 tablet (20 mg total) by mouth daily.  90 tablet  1   ??? esomeprazole (NEXIUM) 40 MG capsule Take 1 capsule (40 mg total) by mouth every morning before breakfast.  90 capsule  3   ??? fluticasone-salmeterol (ADVAIR) 250-50 mcg/dose diskus inhaler Inhale 1 puff into the lungs 2 times a day.  1 Inhaler  5   ??? parab-cety&stea alc-pro gl-sls (CETAPHIL) Clsr top cleanser Apply thin coat to affected areas three times a day  480 mL  5   ??? topiramate (TOPAMAX) 25 MG tablet Take 1 tablet (25 mg total) by mouth 2 times a day.  60 tablet  3   ??? triamcinolone (KENALOG) 0.1 % ointment Apply topically 2 times a day.  80 g  0   ??? [DISCONTINUED] lansoprazole (PREVACID) 15 MG capsule Take 2 capsules (30 mg total) by mouth 2 times a day.  60 capsule  5     No current facility-administered medications for this visit.        ROS:   Review of Systems   Constitutional: Negative for activity change and appetite change.   Respiratory: Negative for chest tightness, shortness of breath and wheezing.    Cardiovascular: Negative for chest pain, palpitations and leg swelling.   Gastrointestinal:        GERD symptoms   Neurological:        Headaches less frequent since started on Topamax          Objective:   Physical Exam   Nursing note and  vitals reviewed.  Constitutional: She is oriented to person, place, and time. She appears well-developed.   HENT:   Head: Normocephalic and atraumatic.   Neck: Normal range of motion. Neck supple.   Cardiovascular: Normal rate and regular rhythm.    Pulmonary/Chest: Effort normal and breath sounds normal.   Abdominal: Soft. Bowel sounds are normal.   Musculoskeletal: Normal range of motion.   Neurological: She is alert and oriented to person, place, and time. She has normal reflexes.   Skin: Skin is dry.             Filed Vitals:    12/15/12 0758   BP: 112/64   Pulse: 95   Weight: 196 lb (88.905 kg)     Body mass index is 39.57 kg/(m^2).  There is no height on file to calculate BSA.        Assessment/Plan:     Patient Active Problem List   Diagnosis   ??? Asthma   ??? Eczema   ??? GERD (gastroesophageal reflux disease)   ??? Atopic dermatitis   ??? Scar of skin   ??? Anemia        1.  GERD-stop Prevacid.  Add Nexium 40 mg qam  2.  Headaches-continue Topamax at current dose  3.  Anemia-continue current iron supplementation  At next visit will repeat CBC and Vitamin D levels

## 2012-12-15 NOTE — Unmapped (Signed)
Stop Prevacid and start Nexium.    Continue other medications at current doses.    FU in 3 months.

## 2012-12-23 DIAGNOSIS — J45909 Unspecified asthma, uncomplicated: Secondary | ICD-10-CM

## 2012-12-23 MED ORDER — albuterol-ipratropium (DUONEB) (TI) 3 ml inhalation
0.02 | Freq: Once | RESPIRATORY_TRACT | Status: AC
Start: 2012-12-23 — End: 2012-12-23
  Administered 2012-12-24: 04:00:00 via RESPIRATORY_TRACT

## 2012-12-23 MED ORDER — albuterol (PROAIR HFA) 90 mcg/actuation inhaler
90 | Freq: Four times a day (QID) | RESPIRATORY_TRACT | Status: AC | PRN
Start: 2012-12-23 — End: 2013-01-08

## 2012-12-23 MED ORDER — esomeprazole (NEXIUM) 40 MG capsule
40 | ORAL_CAPSULE | Freq: Every morning | ORAL | Status: AC
Start: 2012-12-23 — End: 2012-12-23

## 2012-12-23 MED FILL — ALBUTEROL SULFATE CONCENTRATE 2.5 MG/0.5 ML SOLUTION FOR NEBULIZATION: 2.5 2.5 mg/0.5 mL | RESPIRATORY_TRACT | Qty: 0.5

## 2012-12-23 NOTE — Unmapped (Signed)
Asthma Attack Prevention  HOW CAN ASTHMA BE PREVENTED?  Currently, there is no way to prevent asthma from starting. However, you can take steps to control the disease and prevent its symptoms after you have been diagnosed. Learn about your asthma and how to control it. Take an active role to control your asthma by working with your caregiver to create and follow an asthma action plan. An asthma action plan guides you in taking your medicines properly, avoiding factors that make your asthma worse, tracking your level of asthma control, responding to worsening asthma, and seeking emergency care when needed. To track your asthma, keep records of your symptoms, check your peak flow number using a peak flow meter (handheld device that shows how well air moves out of your lungs), and get regular asthma checkups.   Other ways to prevent asthma attacks include:  ?? Use medicines as your caregiver directs.  ?? Identify and avoid things that make your asthma worse (as much as you can).  ?? Keep track of your asthma symptoms and level of control.  ?? Get regular checkups for your asthma.  ?? With your caregiver, write a detailed plan for taking medicines and managing an asthma attack. Then be sure to follow your action plan. Asthma is an ongoing condition that needs regular monitoring and treatment.  ?? Identify and avoid asthma triggers. A number of outdoor allergens and irritants (pollen, mold, cold air, air pollution) can trigger asthma attacks. Find out what causes or makes your asthma worse, and take steps to avoid those triggers.  ?? Monitor your breathing. Learn to recognize warning signs of an attack, such as slight coughing, wheezing or shortness of breath. However, your lung function may already decrease before you notice any signs or symptoms, so regularly measure and record your peak airflow with a home peak flow meter.  ?? Identify and treat attacks early. If you act quickly, you're less likely to have a severe attack.  You will also need less medicine to control your symptoms. When your peak flow measurements decrease and alert you to an upcoming attack, take your medicine as instructed, and immediately stop any activity that may have triggered the attack. If your symptoms do not improve, get medical help.  ?? Pay attention to increasing quick-relief inhaler use. If you find yourself relying on your quick-relief inhaler (such as albuterol), your asthma is not under control. See your caregiver about adjusting your treatment.  IDENTIFY AND CONTROL FACTORS THAT MAKE YOUR ASTHMA WORSE  A number of common things can set off or make your asthma symptoms worse (asthma triggers). Keep track of your asthma symptoms for several weeks, detailing all the environmental and emotional factors that are linked with your asthma. When you have an asthma attack, go back to your asthma diary to see which factor, or combination of factors, might have contributed to it. Once you know what these factors are, you can take steps to control many of them.   Allergies:  If you have allergies and asthma, it is important to take asthma prevention steps at home. Asthma attacks (worsening of asthma symptoms) can be triggered by allergies, which can cause temporary increased inflammation of your airways. Minimizing contact with the substance to which you are allergic will help prevent an asthma attack.  Animal Dander:   ?? Some people are allergic to the flakes of skin or dried saliva from animals with fur or feathers. Keep these pets out of your home.  ?? If you   can't keep a pet outdoors, keep the pet out of your bedroom and other sleeping areas at all times, and keep the door closed.  ?? Remove carpets and furniture covered with cloth from your home. If that is not possible, keep the pet away from fabric-covered furniture and carpets.  Dust Mites:  ?? Many people with asthma are allergic to dust mites. Dust mites are tiny bugs that are found in every home, in  mattresses, pillows, carpets, fabric-covered furniture, bedcovers, clothes, stuffed toys, fabric, and other fabric-covered items.  ?? Cover your mattress in a special dust-proof cover.  ?? Cover your pillow in a special dust-proof cover, or wash the pillow each week in hot water. Water must be hotter than 130?? F to kill dust mites. Cold or warm water used with detergent and bleach can also be effective.  ?? Wash the sheets and blankets on your bed each week in hot water.  ?? Try not to sleep or lie on cloth-covered cushions.  ?? Call ahead when traveling and ask for a smoke-free hotel room. Bring your own bedding and pillows, in case the hotel only supplies feather pillows and down comforters, which may contain dust mites and cause asthma symptoms.  ?? Remove carpets from your bedroom and those laid on concrete, if you can.  ?? Keep stuffed toys out of the bed, or wash the toys weekly in hot water or cooler water with detergent and bleach.  Cockroaches:  ?? Many people with asthma are allergic to the droppings and remains of cockroaches.  ?? Keep food and garbage in closed containers. Never leave food out.  ?? Use poison baits, traps, powders, gels, or paste (for example, boric acid).  ?? If a spray is used to kill cockroaches, stay out of the room until the odor goes away.  Indoor Mold:  ?? Fix leaky faucets, pipes, or other sources of water that have mold around them.  ?? Clean floors and moldy surfaces with a fungicide or diluted bleach.  ?? Avoid using humidifiers, vaporizers, or swamp coolers. These can spread molds through the air.  Pollen and Outdoor Mold:  ?? When pollen or mold spore counts are high, try to keep your windows closed.  ?? Stay indoors with windows closed from late morning to afternoon, if you can. Pollen and some mold spore counts are highest at that time.  ?? Ask your caregiver whether you need to take or increase anti-inflammatory medicine before your allergy season starts.  Irritants:   ?? Tobacco smoke is  an irritant. If you smoke, ask your caregiver how you can quit. Ask family members to quit smoking, too. Do not allow smoking in your home or car.  ?? If possible, do not use a wood-burning stove, kerosene heater, or fireplace. Minimize exposure to all sources of smoke, including incense, candles, fires, and fireworks.  ?? Try to stay away from strong odors and sprays, such as perfume, talcum powder, hair spray, and paints.  ?? Decrease humidity in your home and use an indoor air cleaning device. Reduce indoor humidity to below 60 percent. Dehumidifiers or central air conditioners can do this.  ?? Decrease house dust exposure by changing furnace and air cooler filters frequently.  ?? Try to have someone else vacuum for you once or twice a week, if you can. Stay out of rooms while they are being vacuumed and for a short while afterward.  ?? If you vacuum, use a dust mask from a hardware store, a   double-layered or microfilter vacuum cleaner bag, or a vacuum cleaner with a HEPA filter.  ?? Sulfites in foods and beverages can be irritants. Do not drink beer or wine, or eat dried fruit, processed potatoes, or shrimp if they cause asthma symptoms.  ?? Cold air can trigger an asthma attack. Cover your nose and mouth with a scarf on cold or windy days.  ?? Several health conditions can make asthma more difficult to manage, including runny nose, sinus infections, reflux disease, psychological stress, and sleep apnea. Your caregiver will treat these conditions, as well.  ?? Avoid close contact with people who have a cold or the flu, since your asthma symptoms may get worse if you catch the infection from them. Wash your hands thoroughly after touching items that may have been handled by people with a respiratory infection.  ?? Get a flu shot every year to protect against the flu virus, which often makes asthma worse for days or weeks. Also get a pneumonia shot once every 5 10 years.  Medicines:  ?? Aspirin and other pain relievers can  cause asthma attacks. Ten percent to 20% of people with asthma have sensitivity to aspirin or a group of pain relievers called non-steroidal anti-inflammatory medicines (NSAIDS), such as ibuprofen and naproxen. These medicines are used to treat pain and reduce fevers. Asthma attacks caused by any of these medicines can be severe and even fatal. These medicines must be avoided in people who have known aspirin sensitive asthma. Products with acetaminophen are considered safe for people who have asthma. It is important that people with aspirin sensitivity read labels of all over-the-counter medicines used to treat pain, colds, coughs, and fever.  ?? Beta blockers and ACE inhibitors are other medicines which you should discuss with your caregiver, in relation to your asthma.  ALLERGY SKIN TESTING   Ask your asthma caregiver about allergy skin testing or blood testing (RAST test) to identify the allergens to which you are sensitive. If you are found to have allergies, allergy shots (immunotherapy) for asthma may help prevent future allergies and asthma. With allergy shots, small doses of allergens (substances to which you are allergic) are injected under your skin on a regular schedule. Over a period of time, your body may become used to the allergen and less responsive with asthma symptoms. You can also take measures to minimize your exposure to those allergens.  EXERCISE   If you have exercise-induced asthma, or are planning vigorous exercise, or exercise in cold, humid, or dry environments, prevent exercise-induced asthma by following your caregiver's advice regarding asthma treatment before exercising.  Document Released: 07/04/2009 Document Revised: 07/02/2012 Document Reviewed: 07/04/2009  Exit[employer]?? Patient Information ??2014 [employer].

## 2012-12-23 NOTE — Unmapped (Signed)
Patient updated on plan of care and informed of any delays.  Call light is in reach and bed rails up for safety.  No concerns voiced at this time. The patient was advised to use call light if any concerns or questions.

## 2012-12-23 NOTE — Unmapped (Signed)
[facility] contacting office  States that pt's insurance will not cover Nexium  Would like to know if it can be changed to generic medication   Prevacid has been tried in the past but did not help the pt's sx    Please advise  Pharmacy verified

## 2012-12-23 NOTE — Unmapped (Signed)
Pt states that she got hot over the last three days due to her a/c unit went out.Catherine Kitchenand now she is here for asthma. Breath sounds are clear by lat. Pt states that her breath is better now but would like a breathing treatment.

## 2012-12-23 NOTE — Unmapped (Signed)
RT with patient for breathing treatment.  No distress, no concerns

## 2012-12-23 NOTE — Unmapped (Signed)
Catherine Dominguez  I changed the script and sent to [facility].

## 2012-12-23 NOTE — Unmapped (Signed)
Bed:B13W<BR> Expected date:12/23/12<BR> Expected time:10:39 PM<BR> Means of arrival:[place]BR> Comments:<BR> Asthma

## 2012-12-23 NOTE — Unmapped (Signed)
[place] ED Note    12/23/2012    Patient History     HPI: Catherine Dominguez is a 27 y.o. female who presents with No chief complaint on file.  .  Patient presents by EMS.  She describes feeling her asthma and anxiety coming on due to it being very hot in her apartment.  She said the air conditioning has been off.  Patient denies shortness of breath on arrival by EMS.  She denies current wheezing or anxiety on arrival.  Says she is significantly improved after oxygen supplied by EMS.  She did not receive an albuterol in route by EMS.  They say that she has been clear since arrival no hypoxia.  Patient does have a history of asthma without recent severe exacerbation hospitalization.  No current or recent by mouth steroid use.       Past Medical History   Diagnosis Date   ??? Anxiety    ??? Asthma    ??? Eczema        Past Surgical History   Procedure Laterality Date   ??? Tubal ligation     ??? Childbirth     ??? Tubal ligation          reports that she has never smoked. She has never used smokeless tobacco. She reports that she does not drink alcohol or use illicit drugs.    Previous Medications    ESCITALOPRAM OXALATE (LEXAPRO) 20 MG TABLET    Take 1 tablet (20 mg total) by mouth daily.    FLUTICASONE-SALMETEROL (ADVAIR) 250-50 MCG/DOSE DISKUS INHALER    Inhale 1 puff into the lungs 2 times a day.    PARAB-CETY&STEA ALC-PRO GL-SLS (CETAPHIL) CLSR TOP CLEANSER    Apply thin coat to affected areas three times a day    TOPIRAMATE (TOPAMAX) 25 MG TABLET    Take 1 tablet (25 mg total) by mouth 2 times a day.    TRIAMCINOLONE (KENALOG) 0.1 % OINTMENT    Apply topically 2 times a day.       Allergies:   Allergies as of 12/23/2012 - Fully Reviewed 12/23/2012   Allergen Reaction Noted   ??? Mucus relief d (phenylephrine) (phenylephrine-guaifenesin)  06/19/2012       Review of Systems     ROS: See HPI for pertinent positives  All other ROS were negative.    Physical Exam     ED Triage  Vitals   Vital Signs Group      Temp 12/23/12 2249 98.2 ??F (36.8 ??C)      Temp Source 12/23/12 2249 Oral      Heart Rate 12/23/12 2249 70      Heart Rate Source 12/23/12 2249 Automatic      Resp 12/23/12 2249 16      BP 12/23/12 2249 121/81 mmHg      BP Location 12/23/12 2249 Right arm      BP Method 12/23/12 2249 Automatic      Patient Position 12/23/12 2249 Sitting   SpO2 12/23/12 2249 99 %   O2 Device 12/23/12 2249 None (Room air)     Filed Vitals:    12/23/12 2249   BP: 121/81   Pulse: 70   Temp: 98.2 ??F (36.8 ??C)   TempSrc: Oral   Resp: 16   Height: 4' 11 (1.499 m)   Weight: 191 lb 12.8 oz (87 kg)   SpO2: 99%      Temp Readings from Last 2 Encounters:   12/23/12  98.2 ??F (36.8 ??C) Oral   02/04/12 97.8 ??F (36.6 ??C) Oral     BP Readings from Last 2 Encounters:   12/23/12 121/81   12/15/12 112/64     Pulse Readings from Last 2 Encounters:   12/23/12 70   12/15/12 95        Constitutional: Well-appearing, no acute distress, non-toxic appearance   Eyes:  Sclera anicteric, conjunctiva normal.  HEENT:  Atraumatic, external ears normal, oropharynx moist.   Neck: normal range of motion, supple.  Respiratory:  No respiratory distress, No excessory muscle use, clear bilaterally without wheeze  Cardiovascular:  Regular rate, 2+ pulse = BL radial.  GI:  Soft, nondistended, no rebound, no guarding.   Musculoskeletal:  No edema, no deformities.   Skin:  No rash or nodules noted.   Neurologic:  Awake and alert.  Moves all four extremities.  Light touch intact.    Psychiatric:  Normal mood.  Behavior appropriate.      Diagnostic Studies     Labs:      Labs Reviewed - No data to display    Radiology:  None      EKG interpretation: None performed    Emergency Department Procedures         ED Course and MDM     Catherine Dominguez is a 27 y.o. female who presented to the emergency department with likely anxiety reaction improved after removal from the home environment.  Single nebulizer treatment given in the emergency Department.   Given the absence of wheezing or findings concerning for acute asthma exacerbation risks of by mouth steroid administration likely outweigh the benefits.  Patient will be discharged home, followup with primary care in the next week as needed    Meds given in ED or prescribed for discharge:  Medications   albuterol-ipratropium (DUONEB) (TI) 3 ml inhalation (not administered)       Clinical Impression:  1. Asthma        Patient Referred to:    Rogene Houston, CNP  [address]  [place] [address]  [phone]    Call  As needed      Discharge Medications:  New Prescriptions    ALBUTEROL (PROAIR HFA) 90 MCG/ACTUATION INHALER    Inhale 2 puffs into the lungs every 6 hours as needed for Wheezing.             Critical Care Time (Attendings)         Melia Hopes, MD  12/23/12 2317

## 2012-12-24 ENCOUNTER — Inpatient Hospital Stay
Admit: 2012-12-24 | Discharge: 2012-12-24 | Disposition: A | Discharge status: Home / Self Care | Payer: PRIVATE HEALTH INSURANCE

## 2012-12-30 NOTE — Unmapped (Signed)
Submitted PA to [employer] for approval on Nexium

## 2012-12-31 NOTE — Unmapped (Signed)
Called [facility] and addressed their questions  They will submit request and notify the office once approved/denied

## 2012-12-31 NOTE — Unmapped (Signed)
[facility] calling to follow up on the dates that the patient tried the 2 other medications before being written Nexium. Please advise at [phone]. Pharmacy option

## 2013-01-01 ENCOUNTER — Encounter: Payer: PRIVATE HEALTH INSURANCE | Attending: Adult Health

## 2013-01-05 ENCOUNTER — Encounter: Payer: PRIVATE HEALTH INSURANCE | Attending: Adult Health

## 2013-01-07 NOTE — Unmapped (Signed)
Received reply from [facility]  PA was denied   Appeal has been denied     Pt will need to try different RX or OTC   Please advise

## 2013-01-08 ENCOUNTER — Encounter: Payer: PRIVATE HEALTH INSURANCE | Attending: Adult Health

## 2013-01-08 MED ORDER — albuterol (PROAIR HFA) 90 mcg/actuation inhaler
90 | Freq: Four times a day (QID) | RESPIRATORY_TRACT | Status: AC | PRN
Start: 2013-01-08 — End: 2013-05-04

## 2013-01-08 NOTE — Unmapped (Signed)
Please send in inhaler and new rx for protonix if approved  Thank you

## 2013-01-08 NOTE — Unmapped (Signed)
Called in RX

## 2013-01-08 NOTE — Unmapped (Signed)
Refill request for albuterol (PROAIR HFA) 90 mcg/actuation inhaler. Call to [facility] on file [address]    Also the insurance will cover Protonix not the Nexium.

## 2013-01-08 NOTE — Unmapped (Signed)
What medication are we talking about?

## 2013-01-09 MED ORDER — omeprazole (PRILOSEC) 40 MG capsule
40 | ORAL_CAPSULE | Freq: Every morning | ORAL | Status: AC
Start: 2013-01-09 — End: 2013-07-15

## 2013-01-09 NOTE — Unmapped (Signed)
Medication is Nexium

## 2013-01-09 NOTE — Unmapped (Signed)
Message addressed   Thank you

## 2013-01-09 NOTE — Unmapped (Signed)
Let her know I switched her to the prescription strength Prilosec which is basically the same as Nexium.  Sent order to her pharmacy on file

## 2013-02-06 ENCOUNTER — Encounter: Payer: PRIVATE HEALTH INSURANCE | Attending: Adult Health

## 2013-02-09 ENCOUNTER — Ambulatory Visit: Admit: 2013-02-09 | Discharge: 2013-02-09 | Payer: PRIVATE HEALTH INSURANCE | Attending: Adult Health

## 2013-02-09 ENCOUNTER — Inpatient Hospital Stay: Admit: 2013-02-09 | Payer: PRIVATE HEALTH INSURANCE | Attending: Adult Health

## 2013-02-09 DIAGNOSIS — M25561 Pain in right knee: Secondary | ICD-10-CM

## 2013-02-09 DIAGNOSIS — M25569 Pain in unspecified knee: Secondary | ICD-10-CM
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
## 2013-02-09 NOTE — Unmapped (Signed)
Try tylenol arthritis or Ibuprofen as directed on package for knee pain

## 2013-02-09 NOTE — Unmapped (Signed)
Informed pt that Xray is normal  Pt asked if office knew of any organizations to help with furnishings, MA replied no but will make further inquiries.   Gregary Cromer, RMA

## 2013-02-09 NOTE — Unmapped (Signed)
Message copied by Gregary Cromer on Mon Feb 09, 2013  3:50 PM  ------       Message from: [place], [place]       Created: Mon Feb 09, 2013  3:00 PM         Normal rt. Knee xray  ------

## 2013-02-24 NOTE — Unmapped (Signed)
Make appointment to see Catherine Dominguez ASAP

## 2013-02-24 NOTE — Unmapped (Signed)
Per pt she was told to call when the knots appeared under her arms again. They are painful, she has 6 of them. Please advise

## 2013-02-25 NOTE — Unmapped (Signed)
I most definitely offered her a appointment on yesterday , she talked as if she couldn't come in. She even mentioned going to urgent care and I told her that we can accommodate her here just as a urgent care could.  She just wanted me to convey she had the knots under her arm.

## 2013-02-25 NOTE — Unmapped (Signed)
Noted   Will route this to PCP for review when she is back in the office   Thank you

## 2013-02-26 NOTE — Unmapped (Signed)
Attempted to call patient. Phone just rang, no machine. Will call a bit later.

## 2013-02-26 NOTE — Unmapped (Signed)
She needs to schedule

## 2013-03-05 ENCOUNTER — Ambulatory Visit: Admit: 2013-03-05 | Discharge: 2013-03-05 | Payer: PRIVATE HEALTH INSURANCE | Attending: Adult Health

## 2013-03-05 DIAGNOSIS — G8929 Other chronic pain: Secondary | ICD-10-CM

## 2013-03-05 MED ORDER — cyclobenzaprine (FLEXERIL) 10 MG tablet
10 | ORAL_TABLET | Freq: Every evening | ORAL | Status: AC
Start: 2013-03-05 — End: 2013-10-22

## 2013-03-05 NOTE — Unmapped (Signed)
Subjective  HPI:   Patient ID: Catherine Dominguez is a 27 y.o. female here for followup    Chief Complaint:  HPI       Complains of upper back and shoulder pain, chronic, as a result of large breasts.  Also reports frequent yeast rashes under her breasts.  Says bra straps dig into her shoulders.     Medications:  Current Outpatient Prescriptions   Medication Sig Dispense Refill   ??? albuterol (PROAIR HFA) 90 mcg/actuation inhaler Inhale 2 puffs into the lungs every 6 hours as needed for Wheezing.  1 Inhaler  0   ??? escitalopram oxalate (LEXAPRO) 20 MG tablet Take 1 tablet (20 mg total) by mouth daily.  90 tablet  1   ??? fluticasone-salmeterol (ADVAIR) 250-50 mcg/dose diskus inhaler Inhale 1 puff into the lungs 2 times a day.  1 Inhaler  5   ??? omeprazole (PRILOSEC) 40 MG capsule Take 1 capsule (40 mg total) by mouth every morning before breakfast.  90 capsule  1   ??? parab-cety&stea alc-pro gl-sls (CETAPHIL) Clsr top cleanser Apply thin coat to affected areas three times a day  480 mL  5   ??? topiramate (TOPAMAX) 25 MG tablet Take 1 tablet (25 mg total) by mouth 2 times a day.  60 tablet  3   ??? triamcinolone (KENALOG) 0.1 % ointment Apply topically 2 times a day.  80 g  0   ??? cyclobenzaprine (FLEXERIL) 10 MG tablet Take 1 tablet (10 mg total) by mouth At bedtime.  30 tablet  1     No current facility-administered medications for this visit.        ROS:   Review of Systems  See HPI       Objective:   Physical Exam   Nursing note and vitals reviewed.  Constitutional: She is oriented to person, place, and time. She appears well-developed and well-nourished.   Cardiovascular: Normal rate and regular rhythm.    Pulmonary/Chest: Effort normal and breath sounds normal.   Macromastia   Musculoskeletal:   Tenderness to light palpation along the supraspinatus muscles bilaterally.  Deep shoulder groves secondary to large breasts.   Neurological: She is alert and oriented to person, place, and time.   Skin: Skin is warm and dry.          Filed Vitals:    03/05/13 0755   BP: 126/84   Pulse: 98   Height: 5' (1.524 m)   Weight: 189 lb (85.73 kg)     Body mass index is 36.91 kg/(m^2).  Body surface area is 1.90 meters squared.                Assessment/Plan:     Patient Active Problem List   Diagnosis   ??? Asthma   ??? GERD (gastroesophageal reflux disease)   ??? Atopic dermatitis   ??? Anemia        Catherine Dominguez was seen today for follow-up.    Diagnoses and associated orders for this visit:    Chronic back pain greater than 3 months duration  - cyclobenzaprine (FLEXERIL) 10 MG tablet; Take 1 tablet (10 mg total) by mouth At bedtime.    Macromastia      She will check her [facility] provider book to determine breast/plastic surgeons on her plan.  FU in 2 months

## 2013-03-05 NOTE — Unmapped (Signed)
Check provider book for name of breast or plastic surgeon and let me know.  I will refer you    Continue your dieting.  It is working

## 2013-03-09 MED ORDER — aluminum chloride (HYPERCARE) 20 % external solution
20 | Freq: Every evening | TOPICAL | Status: AC
Start: 2013-03-09 — End: 2013-05-04

## 2013-03-16 ENCOUNTER — Ambulatory Visit: Payer: PRIVATE HEALTH INSURANCE | Attending: Adult Health

## 2013-03-19 MED ORDER — loratadine (CLARITIN) 10 mg tablet
10 | ORAL_TABLET | Freq: Every day | ORAL | 1.00 refills | 30.00000 days | Status: AC
Start: 2013-03-19 — End: 2013-10-23

## 2013-03-27 MED ORDER — fluconazole (DIFLUCAN) 150 MG tablet
150 | ORAL_TABLET | Freq: Once | ORAL | Status: AC
Start: 2013-03-27 — End: 2013-03-27

## 2013-04-02 ENCOUNTER — Encounter: Payer: PRIVATE HEALTH INSURANCE | Attending: Adult Health

## 2013-04-03 ENCOUNTER — Ambulatory Visit: Admit: 2013-04-03 | Discharge: 2013-04-03 | Payer: PRIVATE HEALTH INSURANCE | Attending: Adult Health

## 2013-04-03 DIAGNOSIS — N946 Dysmenorrhea, unspecified: Secondary | ICD-10-CM

## 2013-04-03 MED ORDER — ibuprofen (MOTRIN) 600 MG tablet
600 | ORAL_TABLET | ORAL | Status: AC
Start: 2013-04-03 — End: 2013-10-22

## 2013-04-03 MED ORDER — ferrous sulfate (IRON, FERROUS SULFATE,) 325 (65 FE) MG tablet
325 | ORAL_TABLET | Freq: Three times a day (TID) | ORAL | Status: AC
Start: 2013-04-03 — End: 2013-10-22

## 2013-04-03 NOTE — Unmapped (Signed)
Pharmacy calling re RX for     ibuprofen (MOTRIN) 600 MG tablet 04/03/13 -- Rogene Houston, CNP Begin taking the ibuprofen about 48 hours prior to your period.    DIRECTIONS are incomplete. Pls call pharmacy.

## 2013-04-03 NOTE — Unmapped (Signed)
1 q6 hrs prn for cramps

## 2013-04-03 NOTE — Unmapped (Signed)
Subjective  HPI:   Patient ID: Catherine Dominguez is a 27 y.o. female here for an acute visit.    Chief Complaint:  HPI       1.  Complains of dysmenorrhea not relieved by Midol.  Periods are heavy but regular.  2.  Anemia-requesting a script for Ferrous sulfate.  Unable to afford it OTC  3.  Weight loss-voluntary through exercise and decrease in caloric containing beverages  Medications:  Current Outpatient Prescriptions   Medication Sig Dispense Refill   ??? albuterol (PROAIR HFA) 90 mcg/actuation inhaler Inhale 2 puffs into the lungs every 6 hours as needed for Wheezing.  1 Inhaler  0   ??? aluminum chloride (HYPERCARE) 20 % external solution Apply topically at bedtime. Wash off in the morning  35 mL  0   ??? cyclobenzaprine (FLEXERIL) 10 MG tablet Take 1 tablet (10 mg total) by mouth At bedtime.  30 tablet  1   ??? escitalopram oxalate (LEXAPRO) 20 MG tablet Take 1 tablet (20 mg total) by mouth daily.  90 tablet  1   ??? fluticasone-salmeterol (ADVAIR) 250-50 mcg/dose diskus inhaler Inhale 1 puff into the lungs 2 times a day.  1 Inhaler  5   ??? loratadine (CLARITIN) 10 mg tablet Take 1 tablet (10 mg total) by mouth daily.  30 tablet  3   ??? omeprazole (PRILOSEC) 40 MG capsule Take 1 capsule (40 mg total) by mouth every morning before breakfast.  90 capsule  1   ??? parab-cety&stea alc-pro gl-sls (CETAPHIL) Clsr top cleanser Apply thin coat to affected areas three times a day  480 mL  5   ??? topiramate (TOPAMAX) 25 MG tablet Take 1 tablet (25 mg total) by mouth 2 times a day.  60 tablet  3   ??? triamcinolone (KENALOG) 0.1 % ointment Apply topically 2 times a day.  80 g  0     No current facility-administered medications for this visit.        ROS:   Review of Systems  See HPI       Objective:   Physical Exam   Nursing note and vitals reviewed.  Constitutional: She appears well-developed and well-nourished.   HENT:   Head: Normocephalic and atraumatic.   Neck: Normal range of motion. Neck supple. No thyromegaly present.      Cardiovascular: Normal rate and regular rhythm.    Pulmonary/Chest: Effort normal and breath sounds normal.   Abdominal: Soft. Bowel sounds are normal. She exhibits no distension and no mass. There is no tenderness. There is no rebound and no guarding.   Skin: No pallor.             Filed Vitals:    04/03/13 1021   BP: 102/80   Pulse: 71   Resp: 20   Weight: 179 lb 9.6 oz (81.466 kg)   SpO2: 99%     Body mass index is 35.08 kg/(m^2).  Wt Readings from Last 3 Encounters:   04/03/13 179 lb 9.6 oz (81.466 kg)   03/05/13 189 lb (85.73 kg)   02/09/13 190 lb (86.183 kg)                       Assessment/Plan:     Patient Active Problem List   Diagnosis   ??? Asthma   ??? GERD (gastroesophageal reflux disease)   ??? Atopic dermatitis   ??? Anemia        Catherine Dominguez was seen today for  abdominal pain.    Diagnoses and associated orders for this visit:    Dysmenorrhea  - ibuprofen (MOTRIN) 600 MG tablet; Begin taking the ibuprofen about 48 hours prior to your period.    Anemia  - ferrous sulfate (IRON, FERROUS SULFATE,) 325 (65 FE) MG tablet; Take 1 tablet (325 mg total) by mouth 3 times a day with meals.      See orders, patient instructions and/or after visit summary.

## 2013-04-03 NOTE — Unmapped (Signed)
While on the prescription NSAID do not take other over the counter pain medications with the exception of Tylenol.

## 2013-04-03 NOTE — Unmapped (Signed)
Notified the pharmacy.

## 2013-04-08 ENCOUNTER — Ambulatory Visit: Admit: 2013-04-08 | Payer: PRIVATE HEALTH INSURANCE

## 2013-04-08 DIAGNOSIS — N62 Hypertrophy of breast: Secondary | ICD-10-CM

## 2013-04-08 NOTE — Unmapped (Signed)
Chaperone provided for breast exam.  Photos taken per Dr. Renold Don.  Breast reduction worksheet fill out and given to [place].  I have asked patient to fax me medical document from PCP as it relates to macromastia symptoms.  Patient verbalizes understanding of discharge instructions.  RTC after insurance approval.

## 2013-04-08 NOTE — Unmapped (Signed)
Per pt she needs a letter stating how long she has back pain. Send to clinical service coordinator at [facility].Concerning her breast reduction. [phone]

## 2013-04-08 NOTE — Unmapped (Signed)
Pain Note    Current Pain: 7 /10  Description: sharp/stabbing  Injury Date: chronic  Surgery Date: n/a    Pt received Acute Pain Management education and instruction page.    Pain Plan: OTCs

## 2013-04-08 NOTE — Unmapped (Signed)
[facility]  Division of Plastic, Reconstructive, and Hand Surgery    CONSULT/H&P NOTE      ASSESSMENT: Catherine Dominguez is a 27 y.o. 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  Anticipate approximately 800 gram reduction on the left and 500 gram on the right.  - Pictures taken and will be submitted to [facility]  - Will be in discussion with the pt re: the approval process and future follow up needed if approved for surgery  The risks of bleeding, infection, hematoma, seroma, changes in nipple sensation, death of the nipple, scarring, asymmetry, wound dehiscence requiring dressing changes, inability to lactate, and need for further procedures was reviewed.  Photos were obtained.         -------------------------------------------------------------------------------------------------------------------    HISTORY OF PRESENT ILLNESS: Catherine Dominguez is a 27 y.o. female who was consulted regarding her large bilateral breasts.  Pt states that it is causing her significant amount of pain. Has tried losing weight and lost 11# as well as using NSAIDs but symptoms have not improved.  Endorses back/neck pain, shoulder grooving, rashes for 10+ years.  Referred by ob/gyn for evaluation.  Has 5 children; not planning to have any more children.    History of breast masses - none  History of breast cancer - none  Family history of breast cancer - none    REVIEW OF SYSTEMS:  Constitutional: Negative for fevers/chills, fatigue and unexpected weight change.   HENT: Negative for drooling and mouth sores.    Eyes: Negative for photophobia and discharge.   Respiratory: Negative for cough, shortness of breath and wheezing.    Cardiovascular: Negative for chest pain and leg swelling.   Gastrointestinal: Negative for nausea,  vomiting, abdominal pain, diarrhea, constipation and blood in stool.   Genitourinary: Negative for dysuria and flank pain.   Musculoskeletal: Negative for back pain, joint swelling and gait problem.   Skin: Negative for rash and wound.   Neurological: Negative for dizziness, syncope, light-headedness and headaches.   Hematological: Negative for adenopathy. Does not bruise/bleed easily.   Psychiatric/Behavioral: Negative for hallucinations and self-injury.     PAST MEDICAL HISTORY:  Past Medical History   Diagnosis Date   ??? Anxiety    ??? Asthma    ??? Eczema         PAST SURGICAL HISTORY:  Past Surgical History   Procedure Laterality Date   ??? Tubal ligation     ??? Childbirth     ??? Tubal ligation         PERTINENT FAMILY MEDICAL HISTORY:  Non-contibutory    SOCIAL HISTORY:   History     Social History   ??? Marital Status: Married     Spouse Name: N/A     Number of Children: N/A   ??? Years of Education: N/A     Occupational History   ??? Not on file.     Social History Main Topics   ??? Smoking status: Never Smoker    ??? Smokeless tobacco: Never Used   ??? Alcohol Use: No   ??? Drug Use: No   ??? Sexually Active: Yes -- Female partner(s)     Birth Control/ Protection: Surgical     Other Topics Concern   ??? Caffeine Use Yes   ??? Occupational Exposure No   ???  Exercise Yes   ??? Seat Belt Yes     Social History Narrative   ??? No narrative on file       DRUG/FOOD ALLERGIES:  Mucus relief d (phenylephrine) and Percocet    PRIOR TO ADMISSION MEDICATIONS:  Current Outpatient Prescriptions on File Prior to Visit   Medication Sig Dispense Refill   ??? albuterol (PROAIR HFA) 90 mcg/actuation inhaler Inhale 2 puffs into the lungs every 6 hours as needed for Wheezing.  1 Inhaler  0   ??? aluminum chloride (HYPERCARE) 20 % external solution Apply topically at bedtime. Wash off in the morning  35 mL  0   ??? cyclobenzaprine (FLEXERIL) 10 MG tablet Take 1 tablet (10 mg total) by mouth At bedtime.  30 tablet  1   ??? escitalopram oxalate (LEXAPRO) 20 MG tablet Take 1  tablet (20 mg total) by mouth daily.  90 tablet  1   ??? ferrous sulfate (IRON, FERROUS SULFATE,) 325 (65 FE) MG tablet Take 1 tablet (325 mg total) by mouth 3 times a day with meals.  90 tablet  5   ??? fluticasone-salmeterol (ADVAIR) 250-50 mcg/dose diskus inhaler Inhale 1 puff into the lungs 2 times a day.  1 Inhaler  5   ??? ibuprofen (MOTRIN) 600 MG tablet Begin taking the ibuprofen about 48 hours prior to your period.  90 tablet  0   ??? loratadine (CLARITIN) 10 mg tablet Take 1 tablet (10 mg total) by mouth daily.  30 tablet  3   ??? parab-cety&stea alc-pro gl-sls (CETAPHIL) Clsr top cleanser Apply thin coat to affected areas three times a day  480 mL  5   ??? omeprazole (PRILOSEC) 40 MG capsule Take 1 capsule (40 mg total) by mouth every morning before breakfast.  90 capsule  1   ??? topiramate (TOPAMAX) 25 MG tablet Take 1 tablet (25 mg total) by mouth 2 times a day.  60 tablet  3   ??? triamcinolone (KENALOG) 0.1 % ointment Apply topically 2 times a day.  80 g  0     No current facility-administered medications on file prior to visit.       PHYSICAL EXAM:  Filed Vitals:    04/08/13 1345   BP: 128/84   Pulse: 84   Temp: 98.1 ??F (36.7 ??C)   Resp: 16   SpO2: 94%       General: A&O, NAD  HEENT: NCAT, PERRL, sclerae anicteric, nares patent, oropharynx clear, mucous membranes moist, neck supple, no tracheal deviation  Respiratory: Non-labored, non-distressed  CV: RRR  Abdomen: nontender, nondistended  Neuro: CN II-XII grossly intact  Ext: MAES, good cap refill     Breast Exam:  Approximately DDD size; L>R  Ptosis - Grade 3 bilaterally, large pendulous breasts  +Shoulder grooving  No inframammary rashes but IMF's moist

## 2013-04-08 NOTE — Unmapped (Signed)
Please review and advise  Thank you

## 2013-04-09 NOTE — Unmapped (Signed)
She needs to help me out here.  I do not know how long

## 2013-04-09 NOTE — Unmapped (Signed)
Addressing concern through my chart

## 2013-04-10 NOTE — Unmapped (Signed)
Pt was f/u on if the information was sent about her breast reduction. She also wanted to make sure [name] had all that she needed. Please advise.

## 2013-04-13 NOTE — Unmapped (Signed)
Patient called in wondering if her medical records was sent to [place] per her CNP and if we received them.  Request was made per RN Corrie Dandy K per patient to have the medical records sent to her directly.  I was not aware of this request as this is documentation I normally request from patients myself to have them send directly to me for pre auth purposes.  I apologized and explained to her I do not need for her to gather this information as I have access to it.   Explained insurance process and length of wait it could take for [employer] to respond back with an approval or denial.  Patient verbalized understanding and stated zero questions.

## 2013-04-13 NOTE — Unmapped (Signed)
Pt is f/u on breast reduction referral. She says that Dr. Venetia Night office received the notes from [place] on the referral, but pt thought there was also going to be a letter to help the reduction to be approved by [employer]. Pt was not sure if this was the progress that was already faxed. Please advise.    Fax: [phone] Attn: [place]

## 2013-04-13 NOTE — Unmapped (Signed)
Being addressed via mychart

## 2013-05-01 MED ORDER — escitalopram oxalate (LEXAPRO) 20 MG tablet
20 | ORAL_TABLET | ORAL | 1.00 refills | 30.00000 days | Status: AC
Start: 2013-05-01 — End: 2013-05-04

## 2013-05-04 MED ORDER — parab-cety&stea alc-pro gl-sls (CETAPHIL) Clsr top cleanser
TOPICAL | 1.00 refills | 30.00000 days | Status: AC
Start: 2013-05-04 — End: 2013-10-22

## 2013-05-04 MED ORDER — escitalopram oxalate (LEXAPRO) 20 MG tablet
20 | ORAL_TABLET | Freq: Every day | ORAL | 1.00 refills | 30.00000 days | Status: AC
Start: 2013-05-04 — End: 2013-07-17

## 2013-05-04 MED ORDER — aluminum chloride (HYPERCARE) 20 % external solution
20 | Freq: Every evening | TOPICAL | Status: AC
Start: 2013-05-04 — End: 2014-01-25

## 2013-05-04 MED ORDER — albuterol (PROAIR HFA) 90 mcg/actuation inhaler
90 | Freq: Four times a day (QID) | RESPIRATORY_TRACT | Status: AC | PRN
Start: 2013-05-04 — End: 2013-07-17

## 2013-05-04 MED ORDER — fluticasone-salmeterol (ADVAIR) 250-50 mcg/dose diskus inhaler
250-50 | Freq: Two times a day (BID) | RESPIRATORY_TRACT | 2.00 refills | 30.00000 days | Status: AC
Start: 2013-05-04 — End: 2014-08-17

## 2013-05-04 NOTE — Unmapped (Signed)
Refill request per patient

## 2013-05-04 NOTE — Unmapped (Signed)
Rx phoned to pharmacy.

## 2013-05-05 ENCOUNTER — Encounter: Payer: PRIVATE HEALTH INSURANCE | Attending: Adult Health

## 2013-05-07 ENCOUNTER — Ambulatory Visit: Admit: 2013-05-07 | Discharge: 2013-05-07 | Payer: PRIVATE HEALTH INSURANCE | Attending: Adult Health

## 2013-05-07 ENCOUNTER — Inpatient Hospital Stay: Admit: 2013-05-07 | Payer: PRIVATE HEALTH INSURANCE | Attending: Adult Health

## 2013-05-07 DIAGNOSIS — N946 Dysmenorrhea, unspecified: Secondary | ICD-10-CM

## 2013-05-07 DIAGNOSIS — Z0000 Encounter for general adult medical examination without abnormal findings: Secondary | ICD-10-CM

## 2013-05-07 LAB — COMPREHENSIVE METABOLIC PANEL, SERUM
ALT: 32 U/L (ref 13–69)
AST (SGOT): 25 U/L (ref 11–53)
Albumin: 4.3 g/dL (ref 3.6–5.1)
Alkaline Phosphatase: 75 U/L (ref 33–115)
Anion Gap: 11 mmol/L (ref 3–16)
BUN: 5 mg/dL (ref 7–25)
CO2: 28 mmol/L (ref 21–33)
Calcium: 9.3 mg/dL (ref 8.6–10.2)
Chloride: 101 mmol/L (ref 98–110)
Creatinine: 0.78 mg/dL (ref 0.50–1.20)
GFR MDRD Af Amer: 107 See note.
GFR MDRD Non Af Amer: 89 See note.
Glucose: 80 mg/dL (ref 65–99)
Osmolality, Calculated: 286 mOsm/kg (ref 278–305)
Potassium: 3.7 mmol/L (ref 3.5–5.3)
Sodium: 140 mmol/L (ref 135–146)
Total Bilirubin: 0.2 mg/dL (ref 0.2–1.2)
Total Protein: 7.2 g/dL (ref 6.2–8.3)

## 2013-05-07 LAB — LIPID PANEL
Cholesterol, Total: 215 mg/dL (ref 0–200)
HDL: 34 mg/dL (ref 30–60)
LDL Cholesterol: 109 mg/dL (ref 0–160)
Triglycerides: 361 mg/dL (ref 10–150)

## 2013-05-07 NOTE — Unmapped (Signed)
We are concerned about how your weight is affecting your health.  Please consider an appointment to discuss this with our weight loss experts at:     [facility]   [phone] (Office)   [email or web address]

## 2013-05-07 NOTE — Unmapped (Signed)
Subjective  HPI:   Patient ID: Catherine Dominguez is a 27 y.o. female here with multiple issues.    Chief Complaint:  HPI    1.  Dysmenorrhea-Ibuprofen works but she no longer wants to have periods.  Reports cramping and heavy menses.  Has had a tubal ligation.  Reports tried BCP for period control but believes she bled heavier and longer while taking them.  2.  Obesity-discussed the health risks with the patient.  3.  Health maintenance-she is due her annual labs and a flu shot.         Medications:  Current Outpatient Prescriptions   Medication Sig Dispense Refill   ??? albuterol (PROAIR HFA) 90 mcg/actuation inhaler Inhale 2 puffs into the lungs every 6 hours as needed for Wheezing.  1 Inhaler  0   ??? aluminum chloride (HYPERCARE) 20 % external solution Apply topically at bedtime. Wash off in the morning  35 mL  0   ??? cyclobenzaprine (FLEXERIL) 10 MG tablet Take 1 tablet (10 mg total) by mouth At bedtime.  30 tablet  1   ??? escitalopram oxalate (LEXAPRO) 20 MG tablet Take 1 tablet (20 mg total) by mouth daily.  90 tablet  0   ??? ferrous sulfate (IRON, FERROUS SULFATE,) 325 (65 FE) MG tablet Take 1 tablet (325 mg total) by mouth 3 times a day with meals.  90 tablet  5   ??? fluticasone-salmeterol (ADVAIR) 250-50 mcg/dose diskus inhaler Inhale 1 puff into the lungs 2 times a day.  1 Inhaler  5   ??? ibuprofen (MOTRIN) 600 MG tablet Begin taking the ibuprofen about 48 hours prior to your period.  90 tablet  0   ??? loratadine (CLARITIN) 10 mg tablet Take 1 tablet (10 mg total) by mouth daily.  30 tablet  3   ??? omeprazole (PRILOSEC) 40 MG capsule Take 1 capsule (40 mg total) by mouth every morning before breakfast.  90 capsule  1   ??? parab-cety&stea alc-pro gl-sls (CETAPHIL) Clsr top cleanser Apply thin coat to affected areas three times a day  480 mL  5   ??? topiramate (TOPAMAX) 25 MG tablet Take 1 tablet (25 mg total) by mouth 2 times a day.  60 tablet  3   ??? triamcinolone (KENALOG) 0.1 % ointment Apply topically 2 times a  day.  80 g  0     No current facility-administered medications for this visit.        ROS:   Review of Systems  See HPI       Objective:   Physical Exam   Vitals reviewed.  Constitutional: She is oriented to person, place, and time. She appears well-developed and well-nourished.   Neck: Normal range of motion. Neck supple. No thyromegaly present.   Cardiovascular: Normal rate and regular rhythm.    Pulmonary/Chest: Effort normal and breath sounds normal.   Abdominal: Soft. Bowel sounds are normal.   Neurological: She is alert and oriented to person, place, and time.   Skin: Skin is warm and dry.             Filed Vitals:    05/07/13 1319   BP: 110/70   Pulse: 79   Resp: 16   Weight: 182 lb 9.6 oz (82.827 kg)   SpO2: 99%     Body mass index is 35.66 kg/(m^2).  There is no height on file to calculate BSA.  Wt Readings from Last 3 Encounters:   05/07/13 182 lb 9.6  oz (82.827 kg)   04/08/13 183 lb (83.008 kg)   04/03/13 179 lb 9.6 oz (81.466 kg)                   Assessment/Plan:     Patient Active Problem List   Diagnosis   ??? Asthma   ??? GERD (gastroesophageal reflux disease)   ??? Atopic dermatitis   ??? Anemia   ??? Macromastia        Catherine Dominguez was seen today for abdominal cramping.    Diagnoses and associated orders for this visit:    Dysmenorrhea  - Amb referral to Obstetrics / Gynecology    Obesity    Laboratory examination ordered as part of a routine general medical examination  - Lipid Profile; Future  - Comprehensive Metabolic Panel, Serum; Future    Need for prophylactic vaccination and inoculation against influenza  - FLUVIRIN Influenza Vaccine 9 Years of Age and Older WITH Preservative IM (0.5 mL dose)        Patient Instructions   We are concerned about how your weight is affecting your health.  Please consider an appointment to discuss this with our weight loss experts at:     [facility]   [phone] (Office)   [email or web address]

## 2013-05-26 NOTE — Unmapped (Signed)
RN note -  Received and reviewed referral to GYN from A. Stone CNP at [facility] for dysmenorrhea.  Sent to scheduler to make next available NGY with [facility].

## 2013-05-27 ENCOUNTER — Ambulatory Visit: Payer: PRIVATE HEALTH INSURANCE

## 2013-05-27 NOTE — Unmapped (Signed)
Pt here for photos for breast reduction.  Pt/husband declined photos.    F/U if she elects to proceed with precert.

## 2013-06-17 NOTE — Unmapped (Signed)
The ins will only cover Ventolin not the Pro air. Please advise if this can be switched over.

## 2013-06-18 NOTE — Unmapped (Signed)
Please switch it over-they are same drug just a different manufactured

## 2013-06-18 NOTE — Unmapped (Signed)
Called and let the pharmacy know.

## 2013-07-03 ENCOUNTER — Encounter: Payer: PRIVATE HEALTH INSURANCE | Attending: Adult Health

## 2013-07-09 ENCOUNTER — Ambulatory Visit: Admit: 2013-07-09 | Discharge: 2013-07-09 | Payer: PRIVATE HEALTH INSURANCE | Attending: Adult Health

## 2013-07-09 DIAGNOSIS — F32A Depression, unspecified: Secondary | ICD-10-CM

## 2013-07-09 NOTE — Unmapped (Signed)
Subjective  HPI:   Patient ID: Catherine Dominguez is a 27 y.o. female here for an acute visit    Chief Complaint:  HPI       1.  Macromastia-urged to return to surgeon to have more pictures taken.    2.  Anxiety and depression-feeling overwhelmed with school, children and pending holidays.  Difficulty sleeping.  Reports quick to anger.  Denies suicidal or homicidal ideations  Medications:  Current Outpatient Prescriptions   Medication Sig Dispense Refill   ??? albuterol (PROAIR HFA) 90 mcg/actuation inhaler Inhale 2 puffs into the lungs every 6 hours as needed for Wheezing.  1 Inhaler  0   ??? aluminum chloride (HYPERCARE) 20 % external solution Apply topically at bedtime. Wash off in the morning  35 mL  0   ??? cyclobenzaprine (FLEXERIL) 10 MG tablet Take 1 tablet (10 mg total) by mouth At bedtime.  30 tablet  1   ??? escitalopram oxalate (LEXAPRO) 20 MG tablet Take 1 tablet (20 mg total) by mouth daily.  90 tablet  0   ??? ferrous sulfate (IRON, FERROUS SULFATE,) 325 (65 FE) MG tablet Take 1 tablet (325 mg total) by mouth 3 times a day with meals.  90 tablet  5   ??? fluticasone-salmeterol (ADVAIR) 250-50 mcg/dose diskus inhaler Inhale 1 puff into the lungs 2 times a day.  1 Inhaler  5   ??? ibuprofen (MOTRIN) 600 MG tablet Begin taking the ibuprofen about 48 hours prior to your period.  90 tablet  0   ??? loratadine (CLARITIN) 10 mg tablet Take 1 tablet (10 mg total) by mouth daily.  30 tablet  3   ??? omeprazole (PRILOSEC) 40 MG capsule Take 1 capsule (40 mg total) by mouth every morning before breakfast.  90 capsule  1   ??? parab-cety&stea alc-pro gl-sls (CETAPHIL) Clsr top cleanser Apply thin coat to affected areas three times a day  480 mL  5   ??? topiramate (TOPAMAX) 25 MG tablet Take 1 tablet (25 mg total) by mouth 2 times a day.  60 tablet  3   ??? triamcinolone (KENALOG) 0.1 % ointment Apply topically 2 times a day.  80 g  0     No current facility-administered medications for this visit.        ROS:   Review of Systems  See  HPI       Objective:   Physical Exam   Nursing note and vitals reviewed.  Constitutional: She appears well-developed and well-nourished.   Neck: Normal range of motion. Neck supple. No thyromegaly present.   Cardiovascular: Normal rate, regular rhythm, normal heart sounds and intact distal pulses.    Pulmonary/Chest: Effort normal and breath sounds normal.   Macromastia   Psychiatric:   Very chaotic visit as youngest child quite active during the visit.               Filed Vitals:    07/09/13 1044   BP: 110/84   Pulse: 72   Resp: 16   Height: 5' (1.524 m)   Weight: 186 lb 9.6 oz (84.641 kg)   SpO2: 99%     Body mass index is 36.44 kg/(m^2).  Body surface area is 1.89 meters squared.                Assessment/Plan:     Catherine Dominguez was seen today for follow-up.    Diagnoses and associated orders for this visit:    Depression  Macromastia      Continue daily Lexapro.  Patient Instructions   Reschedule your appointment for photos    Notify office at [phone] ([email or web address]) if symptoms worsen or linger.

## 2013-07-09 NOTE — Unmapped (Signed)
Reschedule your appointment for photos

## 2013-07-16 MED ORDER — omeprazole (PRILOSEC) 40 MG capsule
40 | ORAL_CAPSULE | ORAL | Status: AC
Start: 2013-07-16 — End: 2013-07-17

## 2013-07-16 NOTE — Unmapped (Signed)
Refill request   Last filled 01/09/13

## 2013-07-17 MED ORDER — escitalopram oxalate (LEXAPRO) 20 MG tablet
20 | ORAL_TABLET | Freq: Every day | ORAL | 1.00 refills | 30.00000 days | Status: AC
Start: 2013-07-17 — End: 2013-10-22

## 2013-07-17 MED ORDER — omeprazole (PRILOSEC) 40 MG capsule
40 | ORAL_CAPSULE | Freq: Every day | ORAL | Status: AC
Start: 2013-07-17 — End: 2013-12-11

## 2013-07-17 MED ORDER — albuterol (PROAIR HFA) 90 mcg/actuation inhaler
90 | Freq: Four times a day (QID) | RESPIRATORY_TRACT | Status: AC | PRN
Start: 2013-07-17 — End: 2013-10-23

## 2013-07-17 NOTE — Unmapped (Signed)
Rx for proair phoned to pharmacy.

## 2013-07-31 ENCOUNTER — Inpatient Hospital Stay
Admit: 2013-07-31 | Discharge: 2013-07-31 | Disposition: A | Discharge status: Home / Self Care | Payer: PRIVATE HEALTH INSURANCE

## 2013-07-31 DIAGNOSIS — R55 Syncope and collapse: Secondary | ICD-10-CM

## 2013-07-31 MED ORDER — sodium chloride 0.9 % 500 mL bolus
Freq: Once | INTRAVENOUS | Status: AC
Start: 2013-07-31 — End: 2013-07-31
  Administered 2013-07-31: 23:00:00 via INTRAVENOUS

## 2013-07-31 MED FILL — SODIUM CHLORIDE 0.9 % INTRAVENOUS SOLUTION: INTRAVENOUS | Qty: 500

## 2013-07-31 NOTE — Unmapped (Signed)
[place] ED Note    07/31/2013    Patient History     HPI: Catherine Dominguez is a 28 y.o. female who presents with No chief complaint on file.  .  Patient transferred from outside [facility].  Patient apparently collapsed while watching her son had a seizure.  She was caught by staff did not use consciousness.  Blood sugar was performed, 109, EKG was performed showing early repolarization changes.  Patient arrives has no complaints initially wants to go home she had no preceding chest pain palpitations shortness of breath she says she is in her normal state of health lately she was just very upset by seeing her son have a seizure.  She denies hitting her head has no headache       Past Medical History   Diagnosis Date   ??? Anxiety    ??? Asthma    ??? Eczema        Past Surgical History   Procedure Laterality Date   ??? Tubal ligation     ??? Childbirth     ??? Tubal ligation          reports that she has never smoked. She has never used smokeless tobacco. She reports that she does not drink alcohol or use illicit drugs.    Previous Medications    ALBUTEROL (PROAIR HFA) 90 MCG/ACTUATION INHALER    Inhale 2 puffs into the lungs every 6 hours as needed for Wheezing.    ALUMINUM CHLORIDE (HYPERCARE) 20 % EXTERNAL SOLUTION    Apply topically at bedtime. Wash off in the morning    CYCLOBENZAPRINE (FLEXERIL) 10 MG TABLET    Take 1 tablet (10 mg total) by mouth At bedtime.    ESCITALOPRAM OXALATE (LEXAPRO) 20 MG TABLET    Take 1 tablet (20 mg total) by mouth daily.    FERROUS SULFATE (IRON, FERROUS SULFATE,) 325 (65 FE) MG TABLET    Take 1 tablet (325 mg total) by mouth 3 times a day with meals.    FLUTICASONE-SALMETEROL (ADVAIR) 250-50 MCG/DOSE DISKUS INHALER    Inhale 1 puff into the lungs 2 times a day.    IBUPROFEN (MOTRIN) 600 MG TABLET    Begin taking the ibuprofen about 48 hours prior to your period.    LORATADINE (CLARITIN) 10 MG TABLET    Take 1 tablet (10 mg  total) by mouth daily.    OMEPRAZOLE (PRILOSEC) 40 MG CAPSULE    Take 1 capsule (40 mg total) by mouth daily.    PARAB-CETY&STEA ALC-PRO GL-SLS (CETAPHIL) CLSR TOP CLEANSER    Apply thin coat to affected areas three times a day    TOPIRAMATE (TOPAMAX) 25 MG TABLET    Take 1 tablet (25 mg total) by mouth 2 times a day.    TRIAMCINOLONE (KENALOG) 0.1 % OINTMENT    Apply topically 2 times a day.       Allergies:   Allergies as of 07/31/2013 - Fully Reviewed 07/31/2013   Allergen Reaction Noted   ??? Mucus relief d (phenylephrine) [phenylephrine-guaifenesin]  06/19/2012   ??? Percocet [oxycodone-acetaminophen] Swelling 04/08/2013       Review of Systems     ROS: See HPI for pertinent positives  All other ROS were negative.    Physical Exam     ED Triage Vitals   Vital Signs Group      Temp --       Temp src --       Pulse --  Heart Rate Source --       Resp --       BP --       BP Location --       BP Method --       Patient Position --    SpO2 --    O2 Device --      There were no vitals filed for this visit.   Temp Readings from Last 2 Encounters:   04/08/13 98.1 ??F (36.7 ??C) Oral   02/09/13 97.5 ??F (36.4 ??C) Oral     BP Readings from Last 2 Encounters:   07/09/13 110/84   05/07/13 110/70     Pulse Readings from Last 2 Encounters:   07/09/13 72   05/07/13 79        Constitutional: Well-appearing, no acute distress, non-toxic appearance   Eyes:  Sclera anicteric, conjunctiva normal.  No pale tongue, lips or conjunctiva  HEENT:  Atraumatic, external ears normal, oropharynx moist.   Neck: normal range of motion, supple.  Respiratory:  No respiratory distress, No excessory muscle use, clear bilateral  Cardiovascular:  Regular rate, 2+ pulse = BL radial.  GI:  Soft, nondistended, no rebound, no guarding.   Musculoskeletal:  No edema, no deformities.   Skin:  No rash or nodules noted.   Neurologic:  Awake and alert.  Moves all four extremities.  Light touch intact.    Psychiatric:  Normal mood.  Behavior  appropriate.      Diagnostic Studies     Labs:      Labs Reviewed - No data to display    Radiology:  None      EKG interpretation: Outside EKG reviewed, rate 71, normal sinus rhythm, no findings concerning for WPW, long QT or brugaddas.      Emergency Department Procedures         ED Course and MDM     [name] is a 28 y.o. female who presented to the emergency department with syncope related to watching her son have a seizure.  Unremarkable EKG.  No heavy bleeding or clinical findings concerning for anemia.  Patient is in normal state of health.  Discharge    Meds given in ED or prescribed for discharge:  Medications   sodium chloride 0.9 % 500 mL bolus ( Intravenous New Bag 07/31/13 1744)       Clinical Impression:  1. Syncope        Patient Referred to:    Rogene Houston, CNP  [address]  [address]  [place] [address]  [phone]    Call in 1 day        Discharge Medications:  New Prescriptions    No medications on file             Critical Care Time (Attendings)         Shakela Donati, MD  07/31/13 1745

## 2013-07-31 NOTE — Unmapped (Signed)
Vasovagal Syncope, Adult  Syncope, commonly known as fainting, is a temporary loss of consciousness. It occurs when the blood flow to the brain is reduced. Vasovagal syncope (also called neurocardiogenic syncope) is a fainting spell in which the blood flow to the brain is reduced because of a sudden drop in heart rate and blood pressure. Vasovagal syncope occurs when the brain and the cardiovascular system (blood vessels) do not adequately communicate and respond to each other. This is the most common cause of fainting. It often occurs in response to fear or some other type of emotional or physical stress. The body has a reaction in which the heart starts beating too slowly or the blood vessels expand, reducing blood pressure. This type of fainting spell is generally considered harmless. However, injuries can occur if a person takes a sudden fall during a fainting spell.   CAUSES   Vasovagal syncope occurs when a person's blood pressure and heart rate decrease suddenly, usually in response to a trigger. Many things and situations can trigger an episode. Some of these include:   ?? Pain. ??  ?? Fear. ??  ?? The sight of blood or medical procedures, such as blood being drawn from a vein. ??  ?? Common activities, such as coughing, swallowing, stretching, or going to the bathroom. ??  ?? Emotional stress. ??  ?? Prolonged standing, especially in a warm environment. ??  ?? Lack of sleep or rest. ??  ?? Prolonged lack of food. ??  ?? Prolonged lack of fluids. ??  ?? Recent illness.  ?? The use of certain drugs that affect blood pressure, such as cocaine, alcohol, marijuana, inhalants, and opiates. ??  SYMPTOMS   Before the fainting episode, you may:   ?? Feel dizzy or light headed. ??  ?? Become pale.  ?? Sense that you are going to faint. ??  ?? Feel like the room is spinning. ??  ?? Have tunnel vision, only seeing directly in front of you. ??  ?? Feel sick to your stomach (nauseous). ??  ?? See spots or slowly lose vision. ??  ?? Hear ringing in your  ears. ??  ?? Have a headache. ??  ?? Feel warm and sweaty. ??  ?? Feel a sensation of pins and needles.  During the fainting spell, you will generally be unconscious for no longer than a couple minutes before waking up and returning to normal. If you get up too quickly before your body can recover, you may faint again. Some twitching or jerky movements may occur during the fainting spell.   DIAGNOSIS   Your caregiver will ask about your symptoms, take a medical history, and perform a physical exam. Various tests may be done to rule out other causes of fainting. These may include blood tests and tests to check the heart, such as electrocardiography, echocardiography, and possibly an electrophysiology study. When other causes have been ruled out, a test may be done to check the body's response to changes in position (tilt table test).  TREATMENT   Most cases of vasovagal syncope do not require treatment. Your caregiver may recommend ways to avoid fainting triggers and may provide home strategies for preventing fainting. If you must be exposed to a possible trigger, you can drink additional fluids to help reduce your chances of having an episode of vasovagal syncope. If you have warning signs of an oncoming episode, you can respond by positioning yourself favorably (lying down).  If your fainting spells continue, you may be   given medicines to prevent fainting. Some medicines may help make you more resistant to repeated episodes of vasovagal syncope. Special exercises or compression stockings may be recommended. In rare cases, the surgical placement of a pacemaker is considered.  HOME CARE INSTRUCTIONS   ?? Learn to identify the warning signs of vasovagal syncope. ??  ?? Sit or lie down at the first warning sign of a fainting spell. If sitting, put your head down between your legs. If you lie down, swing your legs up in the air to increase blood flow to the brain. ??  ?? Avoid hot tubs and saunas.  ?? Avoid prolonged  standing.  ?? Drink enough fluids to keep your urine clear or pale yellow. Avoid caffeine.  ?? Increase salt in your diet as directed by your caregiver. ??  ?? If you have to stand for a long time, perform movements such as: ??  ?? Crossing your legs. ??  ?? Flexing and stretching your leg muscles. ??  ?? Squatting. ??  ?? Moving your legs. ??  ?? Bending over. ??  ?? Only take over-the-counter or prescription medicines as directed by your caregiver. Do not suddenly stop any medicines without asking your caregiver first.??  SEEK MEDICAL CARE IF:   ?? Your fainting spells continue or happen more frequently in spite of treatment. ??  ?? You lose consciousness for more than a couple minutes.  ?? You have fainting spells during or after exercising or after being startled. ??  ?? You have new symptoms that occur with the fainting spells, such as: ??  ?? Shortness of breath.  ?? Chest pain. ??  ?? Irregular heartbeat. ??  ?? You have episodes of twitching or jerky movements that last longer than a few seconds.  ?? You have episodes of twitching or jerky movements without obvious fainting.  SEEK IMMEDIATE MEDICAL CARE IF:   ?? You have injuries or bleeding after a fainting spell. ??  ?? You have episodes of twitching or jerky movements that last longer than 5 minutes. ??  ?? You have more than one spell of twitching or jerky movements before returning to consciousness after fainting.  MAKE SURE YOU:   ?? Understand these instructions.  ?? Will watch your condition.  ?? Will get help right away if you are not doing well or get worse.  Document Released: 07/02/2012 Document Reviewed: 06/13/2012  [employer]?? Patient Information ??2014 [employer].

## 2013-07-31 NOTE — Unmapped (Addendum)
12 Lead EKG reading done at [facility], copy given to Dr. Richardo Priest

## 2013-07-31 NOTE — Unmapped (Signed)
Bed: C24W  Expected date:   Expected time:   Means of arrival:   Comments:  childrens/syncope

## 2013-08-07 ENCOUNTER — Ambulatory Visit: Admit: 2013-08-07 | Payer: PRIVATE HEALTH INSURANCE

## 2013-08-07 DIAGNOSIS — N62 Hypertrophy of breast: Secondary | ICD-10-CM

## 2013-08-07 NOTE — Unmapped (Signed)
Pt was here today for pictures for a BBR.  Pt tolerated pictures fine and Laverda Sorenson, RN was in the room with me also.  Pictures will be submitted to insurance for approval

## 2013-10-09 ENCOUNTER — Inpatient Hospital Stay
Admit: 2013-10-09 | Discharge: 2013-10-09 | Disposition: A | Discharge status: Home / Self Care | Payer: PRIVATE HEALTH INSURANCE

## 2013-10-09 ENCOUNTER — Emergency Department: Admit: 2013-10-09 | Payer: PRIVATE HEALTH INSURANCE

## 2013-10-09 DIAGNOSIS — R1031 Right lower quadrant pain: Secondary | ICD-10-CM

## 2013-10-09 LAB — URINALYSIS W/RFL TO MICROSCOPIC
Bilirubin, UA: NEGATIVE
Blood, UA: NEGATIVE
Glucose, UA: NEGATIVE mg/dL
Ketones, UA: NEGATIVE mg/dL
Nitrite, UA: NEGATIVE
Protein, UA: NEGATIVE mg/dL
RBC, UA: 3 /HPF (ref 0–3)
Specific Gravity, UA: 1.026 (ref 1.005–1.035)
Squam Epithel, UA: 31 /HPF — ABNORMAL HIGH (ref 0–5)
Urobilinogen, UA: 2 mg/dL — ABNORMAL HIGH (ref 0.2–1.9)
WBC, UA: 4 /HPF (ref 0–5)
pH, UA: 5 (ref 5.0–8.0)

## 2013-10-09 LAB — BASIC METABOLIC PANEL
Anion Gap: 9 mmol/L (ref 3–16)
BUN: 7 mg/dL (ref 7–25)
CO2: 27 mmol/L (ref 21–31)
Calcium: 8.7 mg/dL (ref 8.6–10.3)
Chloride: 101 mmol/L (ref 98–110)
Creatinine: 0.77 mg/dL (ref 0.60–1.30)
GFR MDRD Af Amer: 109 See note.
GFR MDRD Non Af Amer: 90 See note.
Glucose: 149 mg/dL (ref 70–100)
Osmolality, Calculated: 285 mOsm/kg (ref 278–305)
Potassium: 3.7 mmol/L (ref 3.5–5.3)
Sodium: 137 mmol/L (ref 133–146)

## 2013-10-09 LAB — DIFFERENTIAL
Basophils Absolute: 10 /uL (ref 0–200)
Basophils Relative: 0.1 % (ref 0.0–1.0)
Eosinophils Absolute: 398 /uL (ref 15–500)
Eosinophils Relative: 4.1 % (ref 0.0–8.0)
Lymphocytes Absolute: 2474 /uL (ref 850–3900)
Lymphocytes Relative: 25.5 % (ref 15.0–45.0)
Monocytes Absolute: 369 /uL (ref 200–950)
Monocytes Relative: 3.8 % (ref 0.0–12.0)
Neutrophils Absolute: 6451 /uL (ref 1500–7800)
Neutrophils Relative: 66.5 % (ref 40.0–80.0)

## 2013-10-09 LAB — CBC
Hematocrit: 39.1 % (ref 35.0–45.0)
Hemoglobin: 13.2 g/dL (ref 11.7–15.5)
MCH: 28.7 pg (ref 27.0–33.0)
MCHC: 33.8 g/dL (ref 32.0–36.0)
MCV: 84.9 fL (ref 80.0–100.0)
MPV: 8.3 fL (ref 7.5–11.5)
Platelets: 313 10E3/uL (ref 140–400)
RBC: 4.61 10E6/uL (ref 3.80–5.10)
RDW: 14.6 % (ref 11.0–15.0)
WBC: 9.7 10E3/uL (ref 3.8–10.8)

## 2013-10-09 LAB — HCG URINE, QUALITATIVE: Preg Test, Ur: NEGATIVE

## 2013-10-09 MED ORDER — OMNIPAQUE (iohexol) 350 mg iodine/mL 100 mL
350 | Freq: Once | INTRAVENOUS | Status: AC | PRN
Start: 2013-10-09 — End: 2013-10-09
  Administered 2013-10-09: 07:00:00 100 mL via INTRAVENOUS

## 2013-10-09 MED ORDER — morphine injection 4 mg
4 | Freq: Once | INTRAMUSCULAR | Status: AC
Start: 2013-10-09 — End: 2013-10-09
  Administered 2013-10-09: 07:00:00 4 mg via INTRAVENOUS

## 2013-10-09 MED ORDER — ibuprofen (ADVIL,MOTRIN) 800 MG tablet
800 | ORAL_TABLET | Freq: Three times a day (TID) | ORAL | Status: AC | PRN
Start: 2013-10-09 — End: 2013-10-22

## 2013-10-09 MED ORDER — polyethylene glycol (MIRALAX) 17 gram/dose powder
17 | Freq: Every day | ORAL | 0.00 refills | 14.00000 days | Status: AC
Start: 2013-10-09 — End: 2013-10-22

## 2013-10-09 MED ORDER — ondansetron (ZOFRAN) 4 mg/2 mL injection 8 mg
4 | Freq: Once | INTRAMUSCULAR | Status: AC
Start: 2013-10-09 — End: 2013-10-09
  Administered 2013-10-09: 07:00:00 8 mg via INTRAVENOUS

## 2013-10-09 MED FILL — MORPHINE 4 MG/ML INJECTION SYRINGE: 4 4 mg/mL | INTRAMUSCULAR | Qty: 1

## 2013-10-09 MED FILL — ONDANSETRON HCL (PF) 4 MG/2 ML INJECTION SOLUTION: 4 4 mg/2 mL | INTRAMUSCULAR | Qty: 4

## 2013-10-09 NOTE — Unmapped (Signed)
[facility] Note    Date of Service:  10/09/2013  Reason for Visit per Triage: Abdominal Pain    Patient History     HPI:  Catherine Dominguez is a 28 y.o. female who presents with a chief complaint of right lower quadrant abdominal pain.  Patient reports her pain started today and is very severe in her right lower quadrant.  The patient reports that she has had pain like this previously with her menstrual cycles, but she says this is a lot worse and she does not think she is currently on her menses.  It is sharp pain that radiates slightly into her right side and nothing seems to make it better or worse.  The patient has had multiple episodes of nausea and vomiting today because of this pain.  She denies any vaginal discharge, vaginal bleeding, dysuria, hematuria, chest pain or shortness of breath, fever.    There were no other associated signs or symptoms or modifying factors.    Past Medical History   Diagnosis Date   ??? Anxiety    ??? Asthma    ??? Eczema      Past Surgical History   Procedure Laterality Date   ??? Tubal ligation     ??? Childbirth     ??? Tubal ligation       Social History:  [name]  reports that she has never smoked. She has never used smokeless tobacco. She reports that she does not drink alcohol or use illicit drugs.    Family History:  The patient was not aware of any other relevant diseases that run in the family other than what is stated above.    Previous Medications    ALBUTEROL (PROAIR HFA) 90 MCG/ACTUATION INHALER    Inhale 2 puffs into the lungs every 6 hours as needed for Wheezing.    ALUMINUM CHLORIDE (HYPERCARE) 20 % EXTERNAL SOLUTION    Apply topically at bedtime. Wash off in the morning    CYCLOBENZAPRINE (FLEXERIL) 10 MG TABLET    Take 1 tablet (10 mg total) by mouth At bedtime.    ESCITALOPRAM OXALATE (LEXAPRO) 20 MG TABLET    Take 1 tablet (20 mg total) by mouth daily.    FERROUS SULFATE (IRON, FERROUS SULFATE,) 325 (65  FE) MG TABLET    Take 1 tablet (325 mg total) by mouth 3 times a day with meals.    FLUTICASONE-SALMETEROL (ADVAIR) 250-50 MCG/DOSE DISKUS INHALER    Inhale 1 puff into the lungs 2 times a day.    IBUPROFEN (MOTRIN) 600 MG TABLET    Begin taking the ibuprofen about 48 hours prior to your period.    LORATADINE (CLARITIN) 10 MG TABLET    Take 1 tablet (10 mg total) by mouth daily.    OMEPRAZOLE (PRILOSEC) 40 MG CAPSULE    Take 1 capsule (40 mg total) by mouth daily.    PARAB-CETY&STEA ALC-PRO GL-SLS (CETAPHIL) CLSR TOP CLEANSER    Apply thin coat to affected areas three times a day    TOPIRAMATE (TOPAMAX) 25 MG TABLET    Take 1 tablet (25 mg total) by mouth 2 times a day.    TRIAMCINOLONE (KENALOG) 0.1 % OINTMENT    Apply topically 2 times a day.     Allergies:  Allergies as of 10/09/2013 - Fully Reviewed 10/09/2013   Allergen Reaction Noted   ??? Mucus relief d (phenylephrine) [phenylephrine-guaifenesin]  06/19/2012   ??? Percocet [oxycodone-acetaminophen] Swelling 04/08/2013  Review of Systems     ROS:  Please see HPI.  All other ROS were reviewed with the patient and were negative.      Physical Exam     ED Triage Vitals   Vital Signs Group      Temp 10/09/13 0202 98.2 ??F (36.8 ??C)      Temp Source 10/09/13 0202 Oral      Heart Rate 10/09/13 0202 77      Heart Rate Source --       Resp 10/09/13 0202 18      BP 10/09/13 0202 120/76 mmHg      BP Location 10/09/13 0202 Right arm      BP Method 10/09/13 0202 Automatic      Patient Position 10/09/13 0202 Lying   SpO2 10/09/13 0202 99 %   O2 Device 10/09/13 0202 None (Room air)     General: Moderate acute distress, appears stated age, good hygiene, normal development  HEENT: Normocephalic/Atraumatic, PERRL, EOMI, no conjunctival injection/icterus/discharge, nasal cavity clear and without edema/erythema/lesions, oropharynx clear without lesions/exudate/erythema,  mucous membranes moist  Neck: No lymphadenopathy, no thyromegaly, trachea midline, no JVD  CV: RRR, clear  S1/S2, no murmurs  Lungs: Clear to ausculation bilaterally, good breath sounds, no rales/rhonchi/wheeze/stridor  Abdomen: Tender to palpation in RLQ, nondistended, soft, no organomegaly, +BS  GU: No suprapubic pain, No CVA tenderness  Musculoskeletal: Normal muscle bulk, full ROM, no joint swelling/erythema/pain  Neuro: AAO, no gross neurological deficits  Skin: No rash, dry  Extremities: No cyanosis, good capillary refill, good pulses, no edema, no calf tenderness  Psych: Pleasant, conversant, answers appropriately      Diagnostic Studies     Labs:  Please see electronic medical record for any tests performed in the ED     Radiology:  Please see electronic medical record for any tests performed in the ED      Emergency Department Procedures       Emergency Department Course and Medical Decision Making     Medications Administered:  Medications   morphine injection 4 mg (4 mg Intravenous Given 10/09/13 0245)   ondansetron (ZOFRAN) 4 mg/2 mL injection 8 mg (8 mg Intravenous Given 10/09/13 0245)   OMNIPAQUE (iohexol) 350 mg iodine/mL 100 mL (100 mLs Intravenous Given 10/09/13 0323)       The patient was admitted to the emergency department and evaluated by myself.  Nursing notes and old charts were reviewed including PMH/PSH/Meds/Allergies/FH/SH.      Given the above we did this abdominal laboratory studies and a CT scan of the abdomen and pelvis to evaluate for appendicitis or kidney stones.  All of these studies were completely negative.  There is no signs of ovarian cysts, so I think an ovarian torsion is very unlikely.  The patient felt much better after the single dose of pain medication here.  The patient has been having crampy pain while here, so I think this is either menstrual cramps, or perhaps she has a gastroenteritis that is causing some bowel pain.  Given her workup is negative, and she is not currently having significant pain, we will discharge her home    The patient and/or family were informed of the  results of any tests, time was given to answer all questions, and a plan was proposed and all agreed with this plan.    Impression:  1. Abdominal pain      Disposition:  Discharged from the ED. See AVS for prescriptions, followup, and discharge  instructions.     Plan:  The overall plan is stated above.  My standard return precautions and/or follow-up instructions were given.    Discharge Medications:  New Prescriptions    IBUPROFEN (ADVIL,MOTRIN) 800 MG TABLET    Take 1 tablet (800 mg total) by mouth every 8 hours as needed for Pain.    POLYETHYLENE GLYCOL (MIRALAX) 17 GRAM/DOSE POWDER    Take 17 g by mouth daily.     Follow-up Information:  Rogene Houston, CNP  [address]  [address]  [place] [address]  [phone]    Call in 2 days        Critical Care Time           Vito Backers, MD  10/09/13 [phone]

## 2013-10-09 NOTE — Unmapped (Signed)
Take all of your regular medications and the medications prescribed today as directed.  Return to the Emergency Department for any worsening of your symptoms or for any new symptoms that concern you.

## 2013-10-09 NOTE — Unmapped (Signed)
Pt with right sided abdominal pain/ flank pain. Cramping type pain- states feels as her normal menstrual cycle, but worse.

## 2013-10-09 NOTE — Unmapped (Signed)
PT to ED from CT . States pain is improved . Await reading and dispo , continue to monitor

## 2013-10-22 ENCOUNTER — Ambulatory Visit: Admit: 2013-10-22 | Discharge: 2013-10-22 | Payer: PRIVATE HEALTH INSURANCE | Attending: Adult Health

## 2013-10-22 ENCOUNTER — Inpatient Hospital Stay: Admit: 2013-10-22 | Payer: PRIVATE HEALTH INSURANCE | Attending: Adult Health

## 2013-10-22 DIAGNOSIS — Z0000 Encounter for general adult medical examination without abnormal findings: Secondary | ICD-10-CM

## 2013-10-22 DIAGNOSIS — R739 Hyperglycemia, unspecified: Secondary | ICD-10-CM

## 2013-10-22 LAB — HEMOGLOBIN A1C: Hemoglobin A1C: 6.3 % (ref 4.8–6.4)

## 2013-10-22 MED ORDER — parab-cety&stea alc-pro gl-sls (CETAPHIL) Clsr top cleanser
TOPICAL | 1.00 refills | 30.00000 days | Status: AC
Start: 2013-10-22 — End: 2014-08-17

## 2013-10-22 MED ORDER — escitalopram oxalate (LEXAPRO) 20 MG tablet
20 | ORAL_TABLET | Freq: Every day | ORAL | 1.00 refills | 30.00000 days | Status: AC
Start: 2013-10-22 — End: 2014-04-21

## 2013-10-22 NOTE — Unmapped (Signed)
erroneuos

## 2013-10-22 NOTE — Unmapped (Signed)
Subjective  HPI:   Patient ID: Catherine Dominguez is a 28 y.o. female here for here annual exam.  Chief Complaint:  HPI       1.  Eczema-active currently.  Cetaphil helps.  Requests a refill  2.  Hyperglycemia-history of gestational diabetes.  Complains of fatigue and polyuria.  3.  Anxiety-does not think Lexapro at current dose is helping.  Several days of sadness past 2 weeks  Medications:  Current Outpatient Prescriptions   Medication Sig Dispense Refill   ??? albuterol (PROAIR HFA) 90 mcg/actuation inhaler Inhale 2 puffs into the lungs every 6 hours as needed for Wheezing.  1 Inhaler  0   ??? aluminum chloride (HYPERCARE) 20 % external solution Apply topically at bedtime. Wash off in the morning  35 mL  0   ??? escitalopram oxalate (LEXAPRO) 20 MG tablet Take 2 tablets (40 mg total) by mouth daily.  180 tablet  0   ??? fluticasone-salmeterol (ADVAIR) 250-50 mcg/dose diskus inhaler Inhale 1 puff into the lungs 2 times a day.  1 Inhaler  5   ??? omeprazole (PRILOSEC) 40 MG capsule Take 1 capsule (40 mg total) by mouth daily.  90 capsule  0   ??? parab-cety&stea alc-pro gl-sls (CETAPHIL) Clsr top cleanser Apply thin coat to affected areas three times a day  480 mL  5   ??? loratadine (CLARITIN) 10 mg tablet Take 1 tablet (10 mg total) by mouth daily.  30 tablet  3     No current facility-administered medications for this visit.        ROS:   Review of Systems  No unusual headaches, visual disturbances or allergy symptoms.  No lymphadenopathy, fever or chills.  No prolonged cough, hemoptysis, wheezing or SOB.  No dyspnea palpitations or angina-like chest pain.  No orthopnea or PND.   No TIA-like symptoms.  No abdominal pain, change in bowel habits, black or bloody stools.  No urinary tract symptoms. Denies changes in menstrual cycle or flow.  No skin rashes, mouth sores or troubling skin lesions.  No suicidal or homicidal ideations.   Denies associated symptoms from current medication regime.   See HPI       Objective:      Physical Exam   Nursing note and vitals reviewed.  Constitutional: She is oriented to person, place, and time. She appears well-developed and well-nourished.   HENT:   Head: Normocephalic and atraumatic.   Mouth/Throat: Oropharynx is clear and moist.   Eyes: EOM are normal. Pupils are equal, round, and reactive to light.   Neck: Normal range of motion. Neck supple. No thyromegaly present.   Cardiovascular: Normal rate, regular rhythm, normal heart sounds and intact distal pulses.    Pulmonary/Chest: Effort normal and breath sounds normal.   Abdominal: Soft. Bowel sounds are normal.   Musculoskeletal:   Normal gait   Neurological: She is alert and oriented to person, place, and time. She has normal reflexes.   Skin: Skin is warm and dry. No rash noted. No erythema. No pallor.             Filed Vitals:    10/22/13 0830   BP: 100/70   Pulse: 96   Resp: 20   Weight: 206 lb 8 oz (93.668 kg)     Body mass index is 40.33 kg/(m^2).  There is no height on file to calculate BSA.                Assessment/Plan:  Catherine Dominguez was seen today for follow-up.    Diagnoses and associated orders for this visit:    Annual physical exam    Eczema  - parab-cety&stea alc-pro gl-sls (CETAPHIL) Clsr top cleanser; Apply thin coat to affected areas three times a day    Hyperglycemia  - POCT Glucose, blood; reagent strip  - Hemoglobin A1c; Future    Anxiety  - escitalopram oxalate (LEXAPRO) 20 MG tablet; Take 2 tablets (40 mg total) by mouth daily.    Other Orders  - Cancel: POCT Glucose, blood; reagent strip

## 2013-10-22 NOTE — Unmapped (Signed)
Increase Lexapro to 2 a day.    Once we receive your laboratory results we will call to discuss them and any changes necessary to your current regime.

## 2013-10-23 ENCOUNTER — Inpatient Hospital Stay
Admit: 2013-10-23 | Discharge: 2013-10-24 | Disposition: A | Discharge status: Home / Self Care | Payer: PRIVATE HEALTH INSURANCE

## 2013-10-23 ENCOUNTER — Emergency Department: Admit: 2013-10-23 | Payer: PRIVATE HEALTH INSURANCE

## 2013-10-23 DIAGNOSIS — R109 Unspecified abdominal pain: Secondary | ICD-10-CM

## 2013-10-23 LAB — DIFFERENTIAL
Basophils Absolute: 34 /uL (ref 0–200)
Basophils Relative: 0.3 % (ref 0.0–1.0)
Eosinophils Absolute: 520 /uL (ref 15–500)
Eosinophils Relative: 4.6 % (ref 0.0–8.0)
Lymphocytes Absolute: 3661 /uL (ref 850–3900)
Lymphocytes Relative: 32.4 % (ref 15.0–45.0)
Monocytes Absolute: 599 /uL (ref 200–950)
Monocytes Relative: 5.3 % (ref 0.0–12.0)
Neutrophils Absolute: 6486 /uL (ref 1500–7800)
Neutrophils Relative: 57.4 % (ref 40.0–80.0)

## 2013-10-23 LAB — CBC
Hematocrit: 36.8 % (ref 35.0–45.0)
Hemoglobin: 12.4 g/dL (ref 11.7–15.5)
MCH: 28.7 pg (ref 27.0–33.0)
MCHC: 33.6 g/dL (ref 32.0–36.0)
MCV: 85.3 fL (ref 80.0–100.0)
MPV: 8.2 fL (ref 7.5–11.5)
Platelets: 312 10*3/uL (ref 140–400)
RBC: 4.32 10*6/uL (ref 3.80–5.10)
RDW: 14.6 % (ref 11.0–15.0)
WBC: 11.3 10*3/uL (ref 3.8–10.8)

## 2013-10-23 LAB — HCG URINE, QUALITATIVE: Preg Test, Ur: NEGATIVE

## 2013-10-23 LAB — BASIC METABOLIC PANEL
Anion Gap: 6 mmol/L (ref 3–16)
BUN: 10 mg/dL (ref 7–25)
CO2: 26 mmol/L (ref 21–31)
Calcium: 9.1 mg/dL (ref 8.6–10.3)
Chloride: 103 mmol/L (ref 98–110)
Creatinine: 0.77 mg/dL (ref 0.60–1.30)
GFR MDRD Af Amer: 109 See note.
GFR MDRD Non Af Amer: 90 See note.
Glucose: 123 mg/dL (ref 70–100)
Osmolality, Calculated: 280 mOsm/kg (ref 278–305)
Potassium: 3.9 mmol/L (ref 3.5–5.3)
Sodium: 135 mmol/L (ref 133–146)

## 2013-10-23 LAB — URINALYSIS W/RFL TO MICROSCOPIC
Bilirubin, UA: NEGATIVE
Blood, UA: NEGATIVE
Glucose, UA: NEGATIVE mg/dL
Ketones, UA: NEGATIVE mg/dL
Nitrite, UA: NEGATIVE
Protein, UA: NEGATIVE mg/dL
RBC, UA: 4 /HPF (ref 0–3)
Specific Gravity, UA: 1.026 (ref 1.005–1.035)
Squam Epithel, UA: 47 /HPF (ref 0–5)
Urobilinogen, UA: 2 mg/dL (ref 0.2–1.9)
WBC, UA: 5 /HPF (ref 0–5)
pH, UA: 5 (ref 5.0–8.0)

## 2013-10-23 MED ORDER — S.M.O.G. enema
Freq: Once | Status: AC
Start: 2013-10-23 — End: 2013-10-23

## 2013-10-23 MED ORDER — loratadine (CLARITIN) 10 mg tablet
10 | ORAL_TABLET | Freq: Every day | ORAL | 1.00 refills | 30.00000 days | Status: AC
Start: 2013-10-23 — End: 2014-04-21

## 2013-10-23 MED ORDER — metFORMIN (GLUCOPHAGE) 500 MG tablet
500 | ORAL_TABLET | Freq: Every day | ORAL | Status: AC
Start: 2013-10-23 — End: 2014-07-02

## 2013-10-23 MED ORDER — albuterol (PROAIR HFA) 90 mcg/actuation inhaler
90 | Freq: Four times a day (QID) | RESPIRATORY_TRACT | Status: AC | PRN
Start: 2013-10-23 — End: 2014-01-25

## 2013-10-23 MED ORDER — blood sugar diagnostic Strp
ORAL_STRIP | Freq: Every day | 0.00 refills | 30.00000 days | Status: AC
Start: 2013-10-23 — End: 2014-07-05

## 2013-10-23 MED ORDER — sodium phosphates (FLEET'S) 19-7 gram/118 mL Enema
19-7 | RECTAL | 0.00 refills | 26.00000 days | Status: AC | PRN
Start: 2013-10-23 — End: ?

## 2013-10-23 MED ORDER — blood-glucose meter (FREESTYLE FREEDOM) kit
1.00 refills | 30.00000 days | Status: AC
Start: 2013-10-23 — End: 2014-07-05

## 2013-10-23 MED FILL — S.M.O.G. ENEMA 400 ML: 400.00 400.00 mL | Qty: 400

## 2013-10-23 NOTE — Unmapped (Signed)
ED Note    Date of service:  10/23/2013    Reason for Visit: Abdominal Pain      Patient History     HPI  28 year old female presents with 2 weeks of right-sided abdominal pain.  The patient is concerned that it may be an ectopic pregnancy it feels similar to one that she had several years ago.  The patient's pain is sharp, stabbing, and intermittent.  It is across her entire lower abdomen in the entire right side of her upper and lower abdomen.  She denies fevers, chills, dysuria, hematuria, vaginal discharge, diarrhea.  She was seen for the same symptoms about 2 weeks ago and at that time had a workup that did not show any cause for her pain.  She states she was seen at urgent care the next day because her pain was continuing at that time she was given Naproxen.  Her pain has continued which is why she returns.  She also states that she has not had a bowel movement in nearly 4 weeks.     Past Medical History   Diagnosis Date   ??? Anxiety    ??? Asthma    ??? Eczema        Past Surgical History   Procedure Laterality Date   ??? Tubal ligation     ??? Childbirth     ??? Tubal ligation         Patient  reports that she has never smoked. She has never used smokeless tobacco. She reports that she does not drink alcohol or use illicit drugs.      Previous Medications    ALBUTEROL (PROAIR HFA) 90 MCG/ACTUATION INHALER    Inhale 2 puffs into the lungs every 6 hours as needed for Wheezing.    ALUMINUM CHLORIDE (HYPERCARE) 20 % EXTERNAL SOLUTION    Apply topically at bedtime. Wash off in the morning    BLOOD SUGAR DIAGNOSTIC STRP    Use 1 strip as directed daily. TEST STRIPS TO MATCH MONITOR  TEST FREQUENCY AS DIRECTED BY PRESCRIBER    BLOOD-GLUCOSE METER (FREESTYLE FREEDOM) KIT    Use as instructed    ESCITALOPRAM OXALATE (LEXAPRO) 20 MG TABLET    Take 2 tablets (40 mg total) by mouth daily.    FLUTICASONE-SALMETEROL (ADVAIR) 250-50 MCG/DOSE DISKUS INHALER    Inhale 1 puff  into the lungs 2 times a day.    LORATADINE (CLARITIN) 10 MG TABLET    Take 1 tablet (10 mg total) by mouth daily.    METFORMIN (GLUCOPHAGE) 500 MG TABLET    Take 1 tablet (500 mg total) by mouth daily with breakfast.    OMEPRAZOLE (PRILOSEC) 40 MG CAPSULE    Take 1 capsule (40 mg total) by mouth daily.    PARAB-CETY&STEA ALC-PRO GL-SLS (CETAPHIL) CLSR TOP CLEANSER    Apply thin coat to affected areas three times a day       Allergies:   Allergies as of 10/23/2013 - Fully Reviewed 10/22/2013   Allergen Reaction Noted   ??? Mucus relief d (phenylephrine) [phenylephrine-guaifenesin]  06/19/2012   ??? Percocet [oxycodone-acetaminophen] Swelling 04/08/2013       Review of Systems     Review of Systems   Constitutional: Negative for fever, chills, diaphoresis and fatigue.   Eyes: Negative for photophobia, pain and redness.   Respiratory: Negative for cough, choking and shortness of breath.    Cardiovascular: Negative for chest pain and palpitations.   Gastrointestinal: Positive for abdominal  pain and constipation. Negative for nausea, vomiting and diarrhea.   Genitourinary: Negative for dysuria, urgency, pelvic pain and nocturia.   Musculoskeletal: Negative for arthralgias, back pain and gait problem.   Skin: Negative for color change, pallor, rash and wound.   Neurological: Negative for dizziness, light-headedness and headaches.   Hematological: Negative for adenopathy. Does not bruise/bleed easily.   Psychiatric/Behavioral: Negative for confusion and decreased concentration.           Physical Exam     ED Triage Vitals   Vital Signs Group      Temp --       Temp src --       Pulse --       Heart Rate Source --       Resp --       BP --       BP Location --       BP Method --       Patient Position --    SpO2 --    O2 Device --        ED Physical Exam  Constitutional:  Well developed, well nourished, no acute distress, non-toxic appearance   Eyes:  PERRL, conjunctiva normal   HENT:  Atraumatic, external ears normal, nose  normal, oropharynx moist, no pharyngeal exudates. Neck- normal range of motion, no tenderness, supple   Respiratory:  No respiratory distress, normal breath sounds, no rales, no wheezing   Cardiovascular:  Normal rate, normal rhythm, no murmurs, no gallops, no rubs   GI:  Soft, nondistended, normal bowel sounds, mild diffuse tenderness to palpation, no organomegaly, no mass, no rebound, no guarding   GU:  No costovertebral angle tenderness   Musculoskeletal:  No edema, no tenderness, no deformities. Back- no tenderness  Integument:  Well hydrated, no rash   Lymphatic:  No lymphadenopathy noted   Neurologic:  Alert & oriented x 3, CN 2-12 normal, normal motor function, normal sensory function, no focal deficits noted   Psychiatric:  Speech and behavior appropriate       Diagnostic Studies     Labs:    Please see electronic medical record for any tests performed in the ED    Radiology:    Please see electronic medical record for any tests performed in the ED    EKG:    No EKG Performed    Emergency Department Procedures     Procedures    ED Course and MDM     Jerome Otter Carp is a 28 y.o. female who presented to the emergency department with Abdominal Pain      Patient 28 year old female who presents with 2 weeks of abdominal pain.  She's had no change in her abdominal pain that she was seen 2 weeks ago and had a negative CT and negative workup.  She was concerned it was an ectopic pregnancy as it felt similar to an ectopic she had several years only less severe.  She has no pelvic tenderness to palpation, she's not had any discharge, and her pregnancy test is negative.  In addition she has also had a tubal ligation.  He does have a significant amount of stool on her x-ray and has not had a bowel movement in 4 weeks and I suspect that her abdominal pain is due to constipation.  As her symptoms have been going on for 4 weeks and she has not had any change in them I think it is very unlikely that this is  due to the  life-threatening cause and I think it is safe to discharge her home with an enema.     Critical Care Time (Attendings)           Ilda Mori, MD  10/23/13 2221

## 2013-10-23 NOTE — Unmapped (Signed)
You were seen for abdominal pain.  Your labs here were normal.  We reviewed you CT scan from your last visit which was normal. We suspect your symptoms may be due to constipation as you have a large amount of stool on your xray and you have not had a bowel movement in 4 weeks. Please use the fleets enemas as prescribed. If your symptoms have not improved after you have a bowel movement please see your primary care doctor. If you have a change in your pain, you develop fevers, you have increasing pain, or any other concerns please return to the emergency department

## 2013-10-23 NOTE — Unmapped (Signed)
Plan of care was discussed with pt by Tx team. All paper work with instructions given to pt. Pt was asked or any further concerns or questions prior to discharge. Pt verbalized understanding. Pt advised to follow up with PCP or return to er if worsening of symptoms or additional concerns.

## 2013-10-23 NOTE — Unmapped (Signed)
Pt refused S.M.O.G enema in ER. MD made aware. Pt stated she just wanted to go home and give herself an enema.

## 2013-10-23 NOTE — Unmapped (Signed)
Rt sided abd pain. Feels like etopic preg. Has had one in past.

## 2013-12-11 MED ORDER — omeprazole (PRILOSEC) 40 MG capsule
40 | ORAL_CAPSULE | Freq: Every day | ORAL | Status: AC
Start: 2013-12-11 — End: 2014-07-05

## 2014-01-25 MED ORDER — albuterol (PROAIR HFA) 90 mcg/actuation inhaler
90 | Freq: Four times a day (QID) | RESPIRATORY_TRACT | Status: AC | PRN
Start: 2014-01-25 — End: 2014-06-01

## 2014-01-25 MED ORDER — aluminum chloride (HYPERCARE) 20 % external solution
20 | Freq: Every evening | TOPICAL | Status: AC
Start: 2014-01-25 — End: ?

## 2014-01-25 NOTE — Unmapped (Signed)
Rx's were phoned into the pharmacy.

## 2014-01-25 NOTE — Unmapped (Signed)
Amada Jupiter from [facility] said the [employer] wants them to change from the albuterol [facility] HFA) 90 mcg/actuation inhaler [[id number]]  To the Ventralium inhaler.  Please call them back at [phone]

## 2014-01-25 NOTE — Unmapped (Signed)
Fine with me

## 2014-01-25 NOTE — Unmapped (Signed)
Pharmacy was notified.

## 2014-04-21 ENCOUNTER — Ambulatory Visit: Admit: 2014-04-21 | Discharge: 2014-04-21 | Payer: PRIVATE HEALTH INSURANCE | Attending: Adult Health

## 2014-04-21 DIAGNOSIS — R635 Abnormal weight gain: Secondary | ICD-10-CM

## 2014-04-21 MED ORDER — escitalopram oxalate (LEXAPRO) 20 MG tablet
20 | ORAL_TABLET | Freq: Every day | ORAL | 1.00 refills | 30.00000 days | Status: AC
Start: 2014-04-21 — End: 2014-04-21

## 2014-04-21 MED ORDER — loratadine (CLARITIN) 10 mg tablet
10 | ORAL_TABLET | Freq: Every day | ORAL | 1.00 refills | 30.00000 days | Status: AC
Start: 2014-04-21 — End: 2014-07-05

## 2014-04-21 MED ORDER — escitalopram oxalate (LEXAPRO) 20 MG tablet
20 | ORAL_TABLET | Freq: Every day | ORAL | 1.00 refills | 30.00000 days | Status: AC
Start: 2014-04-21 — End: 2014-07-24

## 2014-04-21 NOTE — Unmapped (Signed)
Subjective  HPI:   Patient ID: Catherine Dominguez is a 28 y.o. female here for an acute visit    Chief Complaint:  HPI       1.  Macromastia-shoulder and upper back pain continue.  Deep ridges in shoulders from bra strap.  GERD continues.  2.  Weight gain-describes self as a stress eater.    3  Health Maintenance-due flu vaccine    Medications:  Current Outpatient Prescriptions   Medication Sig Dispense Refill   ??? albuterol (PROAIR HFA) 90 mcg/actuation inhaler Inhale 2 puffs into the lungs every 6 hours as needed for Wheezing.  1 Inhaler  0   ??? aluminum chloride (HYPERCARE) 20 % external solution Apply topically at bedtime. Wash off in the morning  35 mL  0   ??? blood sugar diagnostic Strp Use 1 strip as directed daily. TEST STRIPS TO MATCH MONITOR  TEST FREQUENCY AS DIRECTED BY PRESCRIBER  90 strip  3   ??? blood-glucose meter (FREESTYLE FREEDOM) kit Use as instructed  1 each  0   ??? escitalopram oxalate (LEXAPRO) 20 MG tablet Take 2 tablets (40 mg total) by mouth daily.  180 tablet  0   ??? fluticasone-salmeterol (ADVAIR) 250-50 mcg/dose diskus inhaler Inhale 1 puff into the lungs 2 times a day.  1 Inhaler  5   ??? loratadine (CLARITIN) 10 mg tablet Take 1 tablet (10 mg total) by mouth daily.  30 tablet  3   ??? metFORMIN (GLUCOPHAGE) 500 MG tablet Take 1 tablet (500 mg total) by mouth daily with breakfast.  90 tablet  1   ??? omeprazole (PRILOSEC) 40 MG capsule Take 1 capsule (40 mg total) by mouth daily.  90 capsule  0   ??? parab-cety&stea alc-pro gl-sls (CETAPHIL) Clsr top cleanser Apply thin coat to affected areas three times a day  480 mL  5   ??? sodium phosphates (FLEET'S) 19-7 gram/118 mL Enema Place 118 mLs rectally as needed.  2 Bottle  0     No current facility-administered medications for this visit.        ROS:   Review of Systems  See HPI        Objective:   Physical Exam   Nursing note and vitals reviewed.  Constitutional: She is oriented to person, place, and time. She appears well-developed and  well-nourished.   Cardiovascular: Normal rate, regular rhythm, normal heart sounds and intact distal pulses.    Pulmonary/Chest: Effort normal and breath sounds normal.   Abdominal: Soft. Bowel sounds are normal. She exhibits no distension and no mass. There is no tenderness. There is no rebound and no guarding.   obese   Musculoskeletal: She exhibits tenderness.   Tenderness to light palpation upper back and neck regions.  Deep groves in shoulders   Neurological: She is alert and oriented to person, place, and time. She has normal reflexes.   Skin: Skin is warm and dry.             Filed Vitals:    04/21/14 1015   BP: 110/82   Pulse: 56   Resp: 20   Weight: 210 lb (95.255 kg)     Body mass index is 36.03 kg/(m^2).  There is no height on file to calculate BSA.                Assessment/Plan:   1. Macromastia  Advised to return to plastic surgery clinic to complete the evaluation    2. Gastroesophageal  reflux disease without esophagitis  The current medical regimen is effective;  Continue present plan and medications.  Avoid late night eating      3. Weight gain  Wt Readings from Last 3 Encounters:   04/21/14 210 lb (95.255 kg)   10/23/13 216 lb 11.2 oz (98.294 kg)   10/22/13 206 lb 8 oz (93.668 kg)         4. Need for prophylactic vaccination and inoculation against influenza  given    5. Anxiety    - escitalopram oxalate (LEXAPRO) 20 MG tablet; Take 2 tablets (40 mg total) by mouth daily.  Dispense: 180 tablet; Refill: 0    6. Depression (emotion)    - escitalopram oxalate (LEXAPRO) 20 MG tablet; Take 2 tablets (40 mg total) by mouth daily.  Dispense: 180 tablet; Refill: 0         Patient Instructions   We are concerned about how your weight is affecting your health.  Please consider an appointment to discuss this with our weight loss experts at:     [facility]   [phone] (Office)   [email or web address]    Continue Lexapro at current dose    Contact plastic surgeon to see if you will  need another referral.

## 2014-04-21 NOTE — Unmapped (Addendum)
We are concerned about how your weight is affecting your health.  Please consider an appointment to discuss this with our weight loss experts at:     [facility]   [phone] (Office)   [email or web address]    Continue Lexapro at current dose    Contact plastic surgeon to see if you will need another referral.

## 2014-04-23 MED ORDER — fluconazole (DIFLUCAN) 150 MG tablet
150 | ORAL_TABLET | ORAL | Status: AC
Start: 2014-04-23 — End: 2014-07-05

## 2014-06-01 MED ORDER — albuterol (PROAIR HFA) 90 mcg/actuation inhaler
90 | Freq: Four times a day (QID) | RESPIRATORY_TRACT | Status: AC | PRN
Start: 2014-06-01 — End: 2014-07-07

## 2014-06-01 NOTE — Unmapped (Signed)
Patient called stating patient was having a weight issue. Ann had suggested a medication but her insurance will not cover it. She is willing to pay out of pocket but still need to have a script from ann to do it.    Patient can pick up the script from the office or if possible called into the pharmacy [facility]  [phone]    Just let her know which will be done

## 2014-06-02 NOTE — Unmapped (Signed)
Done faxed over to pharmacy.

## 2014-06-17 NOTE — Unmapped (Signed)
Pt was scheduled to attend the Plastics Surgery Bilateral Breast Reduction Educational Workshop this afternoon. Pt was not in attendance in the class.

## 2014-06-30 ENCOUNTER — Other Ambulatory Visit: Admit: 2014-06-30 | Payer: PRIVATE HEALTH INSURANCE

## 2014-06-30 ENCOUNTER — Ambulatory Visit: Admit: 2014-06-30 | Discharge: 2014-06-30 | Payer: PRIVATE HEALTH INSURANCE | Attending: Adult Health

## 2014-06-30 DIAGNOSIS — R739 Hyperglycemia, unspecified: Secondary | ICD-10-CM

## 2014-06-30 LAB — COMPREHENSIVE METABOLIC PANEL, SERUM
ALT: 39 U/L (ref 7–52)
AST (SGOT): 39 U/L (ref 13–39)
Albumin: 4 g/dL (ref 3.5–5.7)
Alkaline Phosphatase: 61 U/L (ref 36–125)
Anion Gap: 7 mmol/L (ref 3–16)
BUN: 7 mg/dL (ref 7–25)
CO2: 28 mmol/L (ref 21–33)
Calcium: 9.1 mg/dL (ref 8.6–10.3)
Chloride: 102 mmol/L (ref 98–110)
Creatinine: 0.78 mg/dL (ref 0.60–1.30)
GFR MDRD Af Amer: 106 See note.
GFR MDRD Non Af Amer: 88 See note.
Glucose: 153 mg/dL — ABNORMAL HIGH (ref 70–100)
Osmolality, Calculated: 285 mosm/kg (ref 278–305)
Potassium: 4.2 mmol/L (ref 3.5–5.3)
Sodium: 137 mmol/L (ref 133–146)
Total Bilirubin: 0.2 mg/dL (ref 0.0–1.5)
Total Protein: 6.9 g/dL (ref 6.4–8.9)

## 2014-06-30 LAB — POCT GLUCOSE, BLOOD: POCT -  Glucose Monitoring Device: 129

## 2014-06-30 LAB — HEMOGLOBIN A1C: Hemoglobin A1C: 6.8 % (ref 4.8–6.4)

## 2014-06-30 MED ORDER — azithromycin (ZITHROMAX) 250 MG tablet
250 | ORAL_TABLET | ORAL | Status: AC
Start: 2014-06-30 — End: 2014-07-07

## 2014-06-30 NOTE — Unmapped (Addendum)
Although you only take the antibiotic for 5 days it is still fighting infection in your system for 10 days.  It may take the full 10 days before you feel better.    Once we receive your laboratory results we will call to discuss them and any changes necessary to your current regime.

## 2014-06-30 NOTE — Unmapped (Signed)
Subjective  HPI:   Patient ID: Catherine Dominguez is a 28 y.o. female here for an acute visit    Chief Complaint:  HPI       1.  Numbness and tingling upper and lower extremities for past several weeks.  Sugar taste in mouth.  FH of diabetes  2.  Nasal congestion, green rhinorrhea and facial pain x5 days.  No better. No fever or chills    Medications:  Current Outpatient Prescriptions   Medication Sig Dispense Refill   ??? albuterol (PROAIR HFA) 90 mcg/actuation inhaler Inhale 2 puffs into the lungs every 6 hours as needed for Wheezing. 1 Inhaler 0   ??? escitalopram oxalate (LEXAPRO) 20 MG tablet Take 2 tablets (40 mg total) by mouth daily. 180 tablet 0   ??? fluticasone-salmeterol (ADVAIR) 250-50 mcg/dose diskus inhaler Inhale 1 puff into the lungs 2 times a day. 1 Inhaler 5   ??? loratadine (CLARITIN) 10 mg tablet Take 1 tablet (10 mg total) by mouth daily. 30 tablet 3   ??? omeprazole (PRILOSEC) 40 MG capsule Take 1 capsule (40 mg total) by mouth daily. 90 capsule 0   ??? parab-cety&stea alc-pro gl-sls (CETAPHIL) Clsr top cleanser Apply thin coat to affected areas three times a day 480 mL 5   ??? sodium phosphates (FLEET'S) 19-7 gram/118 mL Enema Place 118 mLs rectally as needed. 2 Bottle 0   ??? aluminum chloride (HYPERCARE) 20 % external solution Apply topically at bedtime. Wash off in the morning 35 mL 0   ??? azithromycin (ZITHROMAX) 250 MG tablet Take 2 tablets on day one then one every 24 hours for 4 days. 6 tablet 0   ??? blood sugar diagnostic Strp Use 1 strip as directed daily. TEST STRIPS TO MATCH MONITOR  TEST FREQUENCY AS DIRECTED BY PRESCRIBER 90 strip 3   ??? blood-glucose meter (FREESTYLE FREEDOM) kit Use as instructed 1 each 0   ??? fluconazole (DIFLUCAN) 150 MG tablet Take one today.  Repeat dose in 2 days 2 tablet 0   ??? metFORMIN (GLUCOPHAGE) 500 MG tablet Take 1 tablet (500 mg total) by mouth daily with breakfast. 90 tablet 1     No current facility-administered medications for this visit.        ROS:   Review  of Systems  See HPI       Objective:   Physical Exam   Nursing note and vitals reviewed.  Constitutional: She appears well-developed and well-nourished.   HENT:   Thick PND.  Bilateral maxillary sinus pain to light palpation   Cardiovascular: Normal rate, regular rhythm, normal heart sounds and intact distal pulses.    Pulmonary/Chest: Effort normal and breath sounds normal.   Lymphadenopathy:     She has no cervical adenopathy.   Skin: Skin is warm and dry.             Filed Vitals:    06/30/14 0851   BP: 102/68   Pulse: 96   Height: 5' (1.524 m)   Weight: 217 lb (98.431 kg)     Body mass index is 42.38 kg/(m^2).  Body surface area is 2.04 meters squared.                Assessment/Plan:     1. Hyperglycemia  POCT glucose 129 fasting  - Comprehensive Metabolic Panel, Serum; Future  - Hemoglobin A1c; Future  - POCT Glucose, blood; reagent strip    2. Acute maxillary sinusitis, recurrence not specified    - azithromycin (  ZITHROMAX) 250 MG tablet; Take 2 tablets on day one then one every 24 hours for 4 days.  Dispense: 6 tablet; Refill: 0       Patient Instructions   Although you only take the antibiotic for 5 days it is still fighting infection in your system for 10 days.  It may take the full 10 days before you feel better.    Once we receive your laboratory results we will call to discuss them and any changes necessary to your current regime.

## 2014-07-02 MED ORDER — metFORMIN (GLUCOPHAGE) 500 MG tablet
500 | ORAL_TABLET | Freq: Every day | ORAL | Status: AC
Start: 2014-07-02 — End: ?

## 2014-07-02 NOTE — Unmapped (Signed)
Call to  on call center  [facility] reach [facility]  Call from pt  C/o hyperglcyemia, newly dx'ed diabetic  C/o tingling fingers/toes  hasnt started metformin yet    A/p:hyperglycemia w possible DPN: shd start metfomrin  Will call again this afternoon to arrange fu appt  verbalized understanding & agreement of the proposed plan.     Current Outpatient Prescriptions   Medication Sig Dispense Refill   ??? albuterol (PROAIR HFA) 90 mcg/actuation inhaler Inhale 2 puffs into the lungs every 6 hours as needed for Wheezing. 1 Inhaler 0   ??? aluminum chloride (HYPERCARE) 20 % external solution Apply topically at bedtime. Wash off in the morning 35 mL 0   ??? azithromycin (ZITHROMAX) 250 MG tablet Take 2 tablets on day one then one every 24 hours for 4 days. 6 tablet 0   ??? blood sugar diagnostic Strp Use 1 strip as directed daily. TEST STRIPS TO MATCH MONITOR  TEST FREQUENCY AS DIRECTED BY PRESCRIBER 90 strip 3   ??? blood-glucose meter (FREESTYLE FREEDOM) kit Use as instructed 1 each 0   ??? escitalopram oxalate (LEXAPRO) 20 MG tablet Take 2 tablets (40 mg total) by mouth daily. 180 tablet 0   ??? fluconazole (DIFLUCAN) 150 MG tablet Take one today.  Repeat dose in 2 days 2 tablet 0   ??? fluticasone-salmeterol (ADVAIR) 250-50 mcg/dose diskus inhaler Inhale 1 puff into the lungs 2 times a day. 1 Inhaler 5   ??? loratadine (CLARITIN) 10 mg tablet Take 1 tablet (10 mg total) by mouth daily. 30 tablet 3   ??? metFORMIN (GLUCOPHAGE) 500 MG tablet Take 1 tablet (500 mg total) by mouth daily with breakfast. 90 tablet 1   ??? omeprazole (PRILOSEC) 40 MG capsule Take 1 capsule (40 mg total) by mouth daily. 90 capsule 0   ??? parab-cety&stea alc-pro gl-sls (CETAPHIL) Clsr top cleanser Apply thin coat to affected areas three times a day 480 mL 5   ??? sodium phosphates (FLEET'S) 19-7 gram/118 mL Enema Place 118 mLs rectally as needed. 2 Bottle 0     No current facility-administered medications for this visit.         Lab Results   Component Value Date     HGBA1C 6.8* 06/30/2014    HGBA1C 6.3 10/22/2013    HGBA1C 5.6 04/24/2012     Lab Results   Component Value Date    CREATININE 0.78 06/30/2014       Lab Results   Component Value Date    GLUCOSE 153* 06/30/2014    BUN 7 06/30/2014    CO2 28 06/30/2014    CREATININE 0.78 06/30/2014    K 4.2 06/30/2014    NA 137 06/30/2014    CL 102 06/30/2014    CALCIUM 9.1 06/30/2014    ALBUMIN 4.0 06/30/2014    PROT 6.9 06/30/2014    ALKPHOS 61 06/30/2014    ALT 39 06/30/2014    BILITOT 0.2 06/30/2014       Lab Results   Component Value Date    CHOLTOT 215* 05/07/2013    TRIG 361* 05/07/2013    HDL 34 05/07/2013    LDL 109 05/07/2013

## 2014-07-02 NOTE — Unmapped (Signed)
She has Metformin at home and I refilled.  She has had a meter and strips since 3/15.

## 2014-07-02 NOTE — Unmapped (Signed)
Patient has an appt next Wednesday, she was diagnosed with diabetes and is feeling not well today. She had messaged Rogene Houston through my chart. I had left a voice message with Carloyn Manner. Please call patient

## 2014-07-02 NOTE — Unmapped (Signed)
Received voicemail from triage   Please review patients email and advise  Thank you

## 2014-07-02 NOTE — Unmapped (Signed)
This was addressed.  She is on Metformin .    Lab Results   Component Value Date    HGBA1C 6.8* 06/30/2014

## 2014-07-02 NOTE — Unmapped (Signed)
States that she misplaced the meter and strips from March  She is requesting a new set

## 2014-07-02 NOTE — Unmapped (Signed)
Please advise/send through

## 2014-07-02 NOTE — Unmapped (Signed)
This has been addressed

## 2014-07-02 NOTE — Unmapped (Signed)
Catherine Dominguez  This has all been addressed.  Not sure how so many people have gotten involved here.  [name]

## 2014-07-02 NOTE — Unmapped (Signed)
Needs an script for glucometer,  [facility]

## 2014-07-05 MED ORDER — fluconazole (DIFLUCAN) 150 MG tablet
150 | ORAL_TABLET | ORAL | Status: AC
Start: 2014-07-05 — End: 2014-07-07

## 2014-07-05 MED ORDER — blood sugar diagnostic Strp
ORAL_STRIP | Freq: Every day | 0.00 refills | 30.00000 days | Status: AC
Start: 2014-07-05 — End: ?

## 2014-07-05 MED ORDER — loratadine (CLARITIN) 10 mg tablet
10 | ORAL_TABLET | Freq: Every day | ORAL | 1.00 refills | 30.00000 days | Status: AC
Start: 2014-07-05 — End: ?

## 2014-07-05 MED ORDER — omeprazole (PRILOSEC) 40 MG capsule
40 | ORAL_CAPSULE | Freq: Every day | ORAL | Status: AC
Start: 2014-07-05 — End: ?

## 2014-07-05 MED ORDER — blood-glucose meter (FREESTYLE FREEDOM) kit
1.00 refills | 30.00000 days | Status: AC
Start: 2014-07-05 — End: ?

## 2014-07-05 NOTE — Unmapped (Signed)
I reordered

## 2014-07-05 NOTE — Unmapped (Signed)
Called the strips to the pharmacy.

## 2014-07-07 ENCOUNTER — Ambulatory Visit: Admit: 2014-07-07 | Discharge: 2014-07-07 | Payer: PRIVATE HEALTH INSURANCE | Attending: Adult Health

## 2014-07-07 DIAGNOSIS — E139 Other specified diabetes mellitus without complications: Secondary | ICD-10-CM

## 2014-07-07 MED ORDER — albuterol (PROAIR HFA) 90 mcg/actuation inhaler
90 | Freq: Four times a day (QID) | RESPIRATORY_TRACT | Status: AC | PRN
Start: 2014-07-07 — End: 2014-08-13

## 2014-07-07 NOTE — Unmapped (Signed)
Subjective  HPI:   Patient ID: Catherine Dominguez is a 28 y.o. female here for follow up    Chief Complaint:  HPI       Recent diagnosis of Diabetes, type 2 based upon a A1c of 6.8%.  Started on Metformin 500 mg a day.  Denies associated symptoms from the medication.  Reports upper and lower extremity tingling has resolved.  Her am sugars are now in the 80-99 range.    Medications:  Current Outpatient Prescriptions   Medication Sig Dispense Refill   ??? albuterol (PROAIR HFA) 90 mcg/actuation inhaler Inhale 2 puffs into the lungs every 6 hours as needed for Wheezing. 1 Inhaler 0   ??? aluminum chloride (HYPERCARE) 20 % external solution Apply topically at bedtime. Wash off in the morning 35 mL 0   ??? blood sugar diagnostic Strp Use 1 strip as directed daily. TEST STRIPS TO MATCH MONITOR  TEST FREQUENCY AS DIRECTED BY PRESCRIBER 90 strip 3   ??? blood-glucose meter (FREESTYLE FREEDOM) kit Use as instructed 1 each 0   ??? escitalopram oxalate (LEXAPRO) 20 MG tablet Take 2 tablets (40 mg total) by mouth daily. 180 tablet 0   ??? fluticasone-salmeterol (ADVAIR) 250-50 mcg/dose diskus inhaler Inhale 1 puff into the lungs 2 times a day. 1 Inhaler 5   ??? loratadine (CLARITIN) 10 mg tablet Take 1 tablet (10 mg total) by mouth daily. 30 tablet 3   ??? metFORMIN (GLUCOPHAGE) 500 MG tablet Take 1 tablet (500 mg total) by mouth daily with breakfast. 90 tablet 1   ??? omeprazole (PRILOSEC) 40 MG capsule Take 1 capsule (40 mg total) by mouth daily. 90 capsule 0   ??? parab-cety&stea alc-pro gl-sls (CETAPHIL) Clsr top cleanser Apply thin coat to affected areas three times a day 480 mL 5   ??? sodium phosphates (FLEET'S) 19-7 gram/118 mL Enema Place 118 mLs rectally as needed. 2 Bottle 0     No current facility-administered medications for this visit.        ROS:   Review of Systems  See HPI       Objective:   Physical Exam   Nursing note and vitals reviewed.  Constitutional: She appears well-developed and well-nourished.   Cardiovascular:  Normal rate and regular rhythm.    Pulmonary/Chest: Effort normal and breath sounds normal.   Skin: Skin is warm and dry.   Foot Exam:  Skeletal:  no abnormalities  Pedal Pulses:  dorsalis pedis pulses 2+ symmetric and posterior tibial pulses 2+ symmetric  Peripheral Circulation:  no cyanosis, clubbing or edema; well perfused, no ulceration  Integument:  no skin or nail lesions; no thickening, discoloration or pitting  Neurological:  monofilament and vibratory sensation intact bilaterally and normal Achiles reflex bilaterally               Filed Vitals:    07/07/14 0924   BP: 120/72   Pulse: 76   Resp: 20   Weight: 214 lb (97.07 kg)     Body mass index is 41.79 kg/(m^2).  There is no height on file to calculate BSA.                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  These recommendations are designed to help you prevent the complications of diabetes.  The recommendations are as follows:    1.  HGA1c test performed 1-2 times a year  if your blood sugar is stable and every 3 months if your treatment is being changed or your goals are not met.    2.  A dilated eye exam every year.    3.  A foot exam once yearly, but more often in patients with high risk foot conditions.    4.  A yearly cholesterol test.    5.  A yearly urine test for protein.    6.  Blood pressure taken at each office visit.    7.  Weight at each office visit.    The current medical regimen is effective;  Continue present plan and medications.

## 2014-07-07 NOTE — Unmapped (Signed)
The current medical regimen is effective;  Continue present plan and medications.

## 2014-07-09 NOTE — Unmapped (Signed)
[facility] 07-09-14 [employer] insurance requires 9 months supervised with BMI over 50 or BMI greater than 40 with uncontrolled diabetes. Will need cardiac and pulmonary clearance. H pylori and drug screening and additional labs. Patient will need to be smoke free for six months prior to surgery. Nutrition is not covered patient must be self-pay.  Patient's BMI is 42.38 was dx with diabetes by Rogene Houston this month, unsure if it is considered uncontrolled, only thing we can do is have her meet all the insurance requirements and submit it for prior auth AFTER she's been cleared.

## 2014-07-12 NOTE — Unmapped (Signed)
[facility] 07-12-14 patient changed IA schedule, emailed her a new letter

## 2014-07-12 NOTE — Unmapped (Signed)
[facility] 07-12-14 gave patient benefit details, scheduled for IA and MH, welcome packet emailed to: [email or web address]

## 2014-07-15 NOTE — Unmapped (Signed)
BILATERAL BREAST REDUCTION WORKSHOP    This patient presents to [facility] desiring a Breast Reduction. She attended the educational Breast Reduction Workshop on 07/15/14. Education about the procedure, pre-operative and post-operative states, and criteria for surgical candidates, were given to the pt at that time, and all questions were answered.    Patient History:    Ht: 5'0   Wt: 217#   BMI:42.5  Has your weight been stable? No - is currently in the weight loss program x 9 months and then will be considered for surgical weight loss options after she completes    Current Bra Size: 40DDD  Which of your breasts is larger? left    Plan on becoming pregnant or breastfeeding in the future?     Last Mammogram?      How often do you exercise? 2x/wk    Smoking? no    Personal history of breast cancer? no  Family history of breast cancer? no  Personal history of breast surgery? no    Symptoms -  Upper Back Pain? yes  Lower Back Pain? Yes  Shoulder/Neck Pain? yes  Breast Pain? Yes  Grooves on shoulders from bra straps? yes  Rashes or skin infections under the breasts? yes  Other:      Treatments Tried -  Physical Therapy? Yes - in 2012 for 4 months  Chiropractor? Yes - 2012 for 6 months  Medications? yes - 2 OTC tabs PRN  Specialty Bra/wide straps or other Supportive Devices? Yes - about 4 months ago    Allergies:   Allergies   Allergen Reactions   ??? Mucus Relief D (Phenylephrine) [Phenylephrine-Guaifenesin]    ??? Percocet [Oxycodone-Acetaminophen] Swelling       Pharmacy Contact Info:     Current Meds -  Current Outpatient Prescriptions   Medication Sig   ??? albuterol Inhale 2 puffs into the lungs every 6 hours as needed for Wheezing.   ??? aluminum chloride Apply topically at bedtime. Wash off in the morning   ??? blood sugar diagnostic Use 1 strip as directed daily. TEST STRIPS TO MATCH MONITOR  TEST FREQUENCY AS DIRECTED BY PRESCRIBER   ??? blood-glucose meter Use as instructed   ??? escitalopram oxalate Take 2  tablets (40 mg total) by mouth daily.   ??? fluticasone-salmeterol Inhale 1 puff into the lungs 2 times a day.   ??? loratadine Take 1 tablet (10 mg total) by mouth daily.   ??? metFORMIN Take 1 tablet (500 mg total) by mouth daily with breakfast.   ??? omeprazole Take 1 capsule (40 mg total) by mouth daily.   ??? parab-cety&stea alc-pro gl-sls Apply thin coat to affected areas three times a day   ??? sodium phosphates Place 118 mLs rectally as needed.     No current facility-administered medications for this visit.       Current Health Care Providers -  Primary Care: Rogene Houston CNP, [facility] [phone])  Cardiology:    Pain Management:    Pulmonology:    Mental Health:    Other:      Current Medical History -  Past Medical History   Diagnosis Date   ??? Anxiety    ??? Asthma    ??? Eczema    ??? Diabetes mellitus    ??? GERD (gastroesophageal reflux disease)        Assessment/Plan:    This patient currently does not meet the criteria for bilateral breast reduction for treatment of symptomatic macromastia.  Since this patient does not meet the criteria for a Breast Reduction at this time, she was given a form to take back to her Primary Care provider, with specific instructions on which criteria to work on. She was encouraged to call the [facility] back at [phone] when she was able to meet AND maintain all of the criteria. The current criteria which she does not meet is/are:  - BMI 32 or less  - HgbA1c 8.0 or less, results before surgery    On First Visit, pt will need:  - HgbA1c  - CMP

## 2014-07-24 MED ORDER — escitalopram oxalate (LEXAPRO) 20 MG tablet
20 | ORAL_TABLET | Freq: Every day | ORAL | 1.00 refills | 30.00000 days | Status: AC
Start: 2014-07-24 — End: 2014-11-01

## 2014-08-13 MED ORDER — albuterol (PROAIR HFA) 90 mcg/actuation inhaler
90 | Freq: Four times a day (QID) | RESPIRATORY_TRACT | Status: AC | PRN
Start: 2014-08-13 — End: 2014-08-30

## 2014-08-17 MED ORDER — fluticasone-salmeterol (ADVAIR) 250-50 mcg/dose diskus inhaler
250-50 | Freq: Two times a day (BID) | RESPIRATORY_TRACT | 2.00 refills | 30.00000 days | Status: AC
Start: 2014-08-17 — End: ?

## 2014-08-17 MED ORDER — parab-cety&stea alc-pro gl-sls (CETAPHIL) Clsr top cleanser
TOPICAL | 1.00 refills | 30.00000 days | Status: AC
Start: 2014-08-17 — End: ?

## 2014-08-17 NOTE — Unmapped (Signed)
Pharmacy was notified.

## 2014-08-17 NOTE — Unmapped (Signed)
Can be used for both

## 2014-08-17 NOTE — Unmapped (Signed)
Calling regarding script just received from [facility]    parab-cety&stea alc-pro gl-sls (CETAPHIL) Clsr top cleanser   Apply thin coat to affected areas three times a day, Normal, Last Dose: Not Recorded    It says its a cleanser but the directions seems that it is a cream or lotion. Just want to clarify

## 2014-08-19 ENCOUNTER — Ambulatory Visit: Payer: PRIVATE HEALTH INSURANCE

## 2014-08-24 NOTE — Unmapped (Signed)
[facility] needs some prior authorizations on 2 medications that are not covered by [employer].  She said that she faxed the information and was following up.  The [facility] callback is [phone]

## 2014-08-24 NOTE — Unmapped (Signed)
Patient is calling stating that [facility] told her both Advair and ProAir inhalers require a prior authorization.  Please PA

## 2014-08-30 MED ORDER — albuterol (PROVENTIL HFA;VENTOLIN HFA) 90 mcg/actuation inhaler
90 | RESPIRATORY_TRACT | Status: AC | PRN
Start: 2014-08-30 — End: 2014-09-01

## 2014-08-30 NOTE — Unmapped (Signed)
Will do PA for the advair, it looks like [employer] prefers Ventolin instead of ProAir. Will change and send to [facility] for authorization.

## 2014-08-31 NOTE — Unmapped (Signed)
PA for advair was approved.

## 2014-09-01 MED ORDER — albuterol (VENTOLIN HFA) 90 mcg/actuation inhaler
90 | Freq: Four times a day (QID) | RESPIRATORY_TRACT | Status: AC | PRN
Start: 2014-09-01 — End: ?

## 2014-09-14 ENCOUNTER — Encounter: Payer: PRIVATE HEALTH INSURANCE | Attending: Clinical

## 2014-11-01 MED ORDER — escitalopram oxalate (LEXAPRO) 20 MG tablet
20 | ORAL_TABLET | Freq: Every day | ORAL | 1.00 refills | 30.00000 days | Status: AC
Start: 2014-11-01 — End: 2014-12-10

## 2014-11-01 NOTE — Unmapped (Signed)
Sent My Chart message advising the pt   Tried to call but the number on file is no longer in service

## 2014-11-01 NOTE — Unmapped (Signed)
Please inform patient:    This is the last refill; we will provide her with emergency care for 30 days; she shd find another PCP by then.

## 2014-11-26 ENCOUNTER — Inpatient Hospital Stay
Admit: 2014-11-26 | Discharge: 2014-11-26 | Disposition: A | Discharge status: Home / Self Care | Payer: PRIVATE HEALTH INSURANCE

## 2014-11-26 DIAGNOSIS — S00511A Abrasion of lip, initial encounter: Secondary | ICD-10-CM

## 2014-11-26 MED ORDER — bacitracin-polymyxin b (POLYSPORIN) ointment (packets)
500-10000 | Freq: Once | TOPICAL | Status: AC
Start: 2014-11-26 — End: 2014-11-26
  Administered 2014-11-26: 23:00:00 1 via TOPICAL

## 2014-11-26 MED ORDER — acetaminophen (TYLENOL) tablet 650 mg
325 | Freq: Once | ORAL | Status: AC
Start: 2014-11-26 — End: 2014-11-26
  Administered 2014-11-26: 23:00:00 650 mg via ORAL

## 2014-11-26 MED ORDER — bacitracin-polymyxin B (POLYSPORIN) 500-10,000 unit/gram Pack
500-10000 | PACK | Freq: Two times a day (BID) | TOPICAL | Status: AC
Start: 2014-11-26 — End: 2014-12-01

## 2014-11-26 MED FILL — BACITRACIN-POLYMYXIN B 500 UNIT-10,000 UNIT/GRAM TOPICAL PACKET: 500-10000 500-10,000 unit/gram | TOPICAL | Qty: 1

## 2014-11-26 MED FILL — TYLENOL 325 MG TABLET: 325 325 mg | ORAL | Qty: 2
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
## 2014-11-26 NOTE — Unmapped (Signed)
Please return to the ED if you have trouble breathing or swallowing. Apply ice to your lower lip to reduce the swelling and vaseline to prevent the lip from cracking. Take Tylenol or ibuprofen as directed over the counter as needed for pain. You can apply bacitracin ointment to the abrasion on your cheek to prevent an infection. Signs of an infection would be redness there.
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
## 2014-12-10 ENCOUNTER — Inpatient Hospital Stay
Admit: 2014-12-10 | Discharge: 2014-12-10 | Disposition: A | Discharge status: Home / Self Care | Payer: PRIVATE HEALTH INSURANCE

## 2014-12-10 DIAGNOSIS — F419 Anxiety disorder, unspecified: Secondary | ICD-10-CM

## 2014-12-10 MED ORDER — escitalopram oxalate (LEXAPRO) 20 MG tablet
20 | ORAL_TABLET | Freq: Every day | ORAL | 1.00 refills | 30.00000 days | Status: AC
Start: 2014-12-10 — End: ?

## 2014-12-10 MED ORDER — hepatitis B virus vacc.rec(PF) 20 mcg/mL Syrg 20 mcg
20 | Freq: Once | INTRAMUSCULAR | Status: AC
Start: 2014-12-10 — End: 2014-12-10
  Administered 2014-12-10: 23:00:00 20 ug via INTRAMUSCULAR

## 2014-12-10 MED ORDER — diphth,pertus(acell),tetanus (BOOSTRIX TDAP) 2.5-8-5 Lf-mcg-Lf/0.5mL syringe Syrg 0.5 mL
2.5-8-5 | Freq: Once | INTRAMUSCULAR | Status: AC
Start: 2014-12-10 — End: 2014-12-10
  Administered 2014-12-10: 23:00:00 0.5 mL via INTRAMUSCULAR

## 2014-12-10 MED FILL — ENGERIX-B (PF) 20 MCG/ML INTRAMUSCULAR SYRINGE: 20 20 mcg/mL | INTRAMUSCULAR | Qty: 1

## 2014-12-10 MED FILL — BOOSTRIX TDAP 2.5 LF UNIT-8 MCG-5 LF/0.5 ML INTRAMUSCULAR SYRINGE: 2.5-8-5 2.5-8-5 Lf-mcg-Lf/0.5mL | INTRAMUSCULAR | Qty: 0.5

## 2014-12-10 NOTE — Unmapped (Signed)
Pt with c/o being out of lexapro 40mg  daily. States NP closed office and cannot get refill. States had not taking x1 week. Pt also requesting hep B and tetanus shot for new job.  Pt reports increased anxiety after not being on medications.  No distress.  Pt calm.

## 2014-12-10 NOTE — Unmapped (Signed)
[facility] ED Note    Date of service:  12/10/2014    Reason for Visit: Anxiety      Patient History     HPI Jaeliana Lococo is a 29 year old female who presented to the emergency department for evaluation of medication refill and vaccinations of tetanus and hepatitis B.  The patient stated she normally has been seen by primary care provider Rogene Houston CNP.  The patient presented here patient is unable to follow up and requested vaccinations for appointment which started this coming week.  The patient in addition states she is out of her anxiety medication which is a Lexapro.  She denies any suicidal or homicidal ideation.  She denies palpitation.  She reports no fevers or chills.  She denies any nausea or vomiting.  She denies any dyspnea or shortness of breath.  She reports no palpitations.       Past Medical History   Diagnosis Date   ??? Anxiety    ??? Asthma    ??? Eczema    ??? Diabetes mellitus    ??? GERD (gastroesophageal reflux disease)    ??? Obesity, morbid, BMI 40.0-49.9    ??? Diabetes mellitus    ??? Asthma    ??? Anxiety    ??? GERD (gastroesophageal reflux disease)    ??? Snoring    ??? Depression        Past Surgical History   Procedure Laterality Date   ??? Childbirth     ??? Tubal ligation  06/24/11   ??? Tubal ligation         Patient  reports that she has never smoked. She has never used smokeless tobacco. She reports that she does not drink alcohol or use illicit drugs.      Previous Medications    ALBUTEROL (VENTOLIN HFA) 90 MCG/ACTUATION INHALER    Inhale 2 puffs into the lungs every 6 hours as needed for Wheezing.    ALUMINUM CHLORIDE (HYPERCARE) 20 % EXTERNAL SOLUTION    Apply topically at bedtime. Wash off in the morning    BLOOD SUGAR DIAGNOSTIC STRP    Use 1 strip as directed daily. TEST STRIPS TO MATCH MONITOR  TEST FREQUENCY AS DIRECTED BY PRESCRIBER    BLOOD-GLUCOSE METER (FREESTYLE FREEDOM) KIT    Use as instructed    ESCITALOPRAM OXALATE (LEXAPRO) 20 MG TABLET     Take 2 tablets (40 mg total) by mouth daily.    FERROUS SULFATE 325 (65 FE) MG TABLET    Take 325 mg by mouth daily with breakfast.    FLUTICASONE-SALMETEROL (ADVAIR) 250-50 MCG/DOSE DISKUS INHALER    Inhale 1 puff into the lungs 2 times a day. Indications: Bronchospasm Prevention with COPD    LORATADINE (CLARITIN) 10 MG TABLET    Take 1 tablet (10 mg total) by mouth daily.    METFORMIN (GLUCOPHAGE) 500 MG TABLET    Take 1 tablet (500 mg total) by mouth daily with breakfast.    MULTIVITAMIN (MULTIVITAMIN) TABLET    Take 1 tablet by mouth daily.    OMEPRAZOLE (PRILOSEC) 40 MG CAPSULE    Take 1 capsule (40 mg total) by mouth daily.    PARAB-CETY&STEA ALC-PRO GL-SLS (CETAPHIL) CLSR TOP CLEANSER    Apply thin coat to affected areas three times a day    SODIUM PHOSPHATES (FLEET'S) 19-7 GRAM/118 ML ENEMA    Place 118 mLs rectally as needed.       Allergies:   Allergies as of 12/10/2014 - Fully Reviewed  12/10/2014   Allergen Reaction Noted   ??? Mucus relief d (phenylephrine) [phenylephrine-guaifenesin]  06/19/2012   ??? Percocet [oxycodone-acetaminophen] Swelling 04/08/2013       Review of Systems     Review of Systems    Constitutional:  Denies fever or chills   Eyes:  Denies change in visual acuity   HENT:  Denies nasal congestion or sore throat   Respiratory:  Denies cough or shortness of breath   Cardiovascular:  Denies chest pain or edema   GI:  Denies abdominal pain, nausea, vomiting, bloody stools or diarrhea   GU:  Denies dysuria   Musculoskeletal:  Denies back pain or joint pain   Integument:  Denies rash   Neurologic:  Denies headache, focal weakness or sensory changes   Endocrine:  Denies polyuria or polydipsia   Lymphatic:  Denies swollen glands   Psychiatric:  Denies depression or anxiety     Physical Exam     ED Triage Vitals   Vital Signs Group      Temp 12/10/14 1638 98.4 ??F (36.9 ??C)      Temp Source 12/10/14 1638 Oral      Heart Rate 12/10/14 1638 74      Heart Rate Source 12/10/14 1638 Monitor      Resp  12/10/14 1638 16      SpO2 12/10/14 1638 100 %      BP 12/10/14 1638 134/105 mmHg      BP Location 12/10/14 1638 Right arm      BP Method 12/10/14 1638 Automatic      Patient Position 12/10/14 1638 Sitting   SpO2 12/10/14 1638 100 %   O2 Device 12/10/14 1638 None (Room air)       ED Physical Exam  Constitutional:  Well developed, well nourished, no acute distress, non-toxic appearance   Eyes:  PERRL, conjunctiva normal   HENT:  Atraumatic, external ears normal, nose normal, oropharynx moist, no pharyngeal exudates. Neck- normal range of motion, no tenderness, supple   Respiratory:  No respiratory distress, normal breath sounds, no rales, no wheezing   Cardiovascular:  Normal rate, normal rhythm, no murmurs, no gallops, no rubs   GI:  Soft, nondistended, normal bowel sounds, nontender, no organomegaly, no mass, no rebound, no guarding   GU:  No costovertebral angle tenderness   Musculoskeletal:  No edema, no tenderness, no deformities. Back- no tenderness  Integument:  Well hydrated, no rash   Neurologic:  Alert & oriented x 3, CN 2-12 normal, normal motor function, normal sensory function, no focal deficits noted   GCS 15      Diagnostic Studies     Labs:    Please see electronic medical record for any tests performed in the ED    Radiology:    Please see electronic medical record for any tests performed in the ED    EKG:    No EKG Performed    Emergency Department Procedures     Procedures none    ED Course and MDM     Lessly Viviann Spare Abt is a 29 y.o. female who presented to the emergency department with Anxiety   the patient presented to the emergency department with concerns of primary vaccination and medication refill.  The patient stated she was unable to follow up with her primary care provider and currently is awaiting a new primary care provider.  The patient states she presents for medication refill of her Lexapro and vaccinations.  The patient stated she is  in need of vaccination for preemployment of  tetanus and hepatitis B.  She states she had a primary series of hepatitis B but did not have the follow-up immunization and currently is seeking an appointment in  healthcare and is in need of immunization for this coming week.  The patient further reason presents for evaluation of vaccination medication refill.  She'll be provided further follow-up with new primary care provider for outpatient follow-up and care.           Critical Care Time (Attendings)   None    Final Impression is medication refill    Disposition is discharged to home        Cherly Anderson, MD  12/10/14 1757

## 2014-12-10 NOTE — Unmapped (Signed)
Plan of care discussed with pt by Tx team. Pt given DCD instructions and paper work. Pt given opportunity to express any concerns/ questions. Pt verbalizes understanding denies any questions or concerns at this time. Pt encouraged to follow up w/ PCP and to return to ER for any concerns or emergent health care needs.

## 2014-12-10 NOTE — Unmapped (Signed)
Meet and greet completed utilizing AIDET tool. Plan of care and assessment discussed with pt. Pt denies any questions at this time. Pt A&O x3 RR e/u skin p/w/d

## 2015-01-15 ENCOUNTER — Emergency Department
Admission: EM | Admit: 2015-01-15 | Discharge: 2015-01-15 | Disposition: A | Discharge status: Home / Self Care | Payer: Medicaid - Out of State | Attending: Emergency Medicine | Admitting: Emergency Medicine

## 2015-01-15 ENCOUNTER — Encounter: Payer: Self-pay | Admitting: Emergency Medicine

## 2015-01-15 DIAGNOSIS — F410 Panic disorder [episodic paroxysmal anxiety] without agoraphobia: Secondary | ICD-10-CM | POA: Diagnosis not present

## 2015-01-15 DIAGNOSIS — R0602 Shortness of breath: Secondary | ICD-10-CM | POA: Insufficient documentation

## 2015-01-15 DIAGNOSIS — R0789 Other chest pain: Secondary | ICD-10-CM | POA: Diagnosis not present

## 2015-01-15 DIAGNOSIS — F419 Anxiety disorder, unspecified: Secondary | ICD-10-CM

## 2015-01-15 MED ORDER — LORAZEPAM 1 MG PO TABS
ORAL_TABLET | ORAL | Status: AC
  Administered 2015-01-15: 1 mg via ORAL
  Filled 2015-01-15: qty 1

## 2015-01-15 MED ORDER — LORAZEPAM 1 MG PO TABS
1.0000 mg | ORAL_TABLET | Freq: Once | ORAL | Status: AC
  Administered 2015-01-15: 1 mg via ORAL

## 2015-01-15 NOTE — ED Provider Notes (Signed)
[facility] Emergency Department Provider Note  ____________________________________________  Time seen: Approximately 244 AM  I have reviewed the triage vital signs and the nursing notes.   HISTORY  Chief Complaint Anxiety    HPI Lori Bentley is a 29 y.o. female who has a history of anxiety. The patient reports that she has been restless since 2130 and she has been unable to sleep. The patient reports that she has been dealing with anxiety for 3 years but she is visiting from [place] and ran out of her medication approximately one week ago. She reports that she is in the process of moving here so she has been here for the last 2 weeks and ran out approximately 1 week ago. She reports that she was visiting her in-laws and went to the emergency room if there and was told that she needed to see her primary care physician for medication refill. The patient reports though that she found out her primary care physician is no longer at the office she was going to and that no one else takes her insurance. The patient reports that she has some shortness of breath and chest tightness which is indicative of her anxiety and panic attacks. The patient reports that she took her inhaler but that did not seem to help she also denies any wheezing. The patient denies any vomiting or abdominal pain but is also had some mild nausea. The patient reports that she is unsure exactly what to do since she's been out of her medication. She reports that she did take Benadryl and Tylenol PM to help with her sleep and it did not help. She reports that she has only been sleeping approximately 30 minutes at a time   Past medical history Anxiety Asthma   There are no active problems to display for this patient.   Past surgical history Bilateral tubal ligation   No current outpatient prescriptions on file.  Allergies Percocet  No family history on file.  Social History History   Substance Use Topics  . Smoking status: Never Smoker   . Smokeless tobacco: Not on file  . Alcohol Use: No    Review of Systems Constitutional: No fever/chills Eyes: No visual changes. ENT: No sore throat. Cardiovascular: Chest tightness Respiratory: shortness of breath. Gastrointestinal: Nausea with No abdominal pain.   Genitourinary: Negative for dysuria. Musculoskeletal: Negative for back pain. Skin: Negative for rash. Neurological: Negative for headaches, focal weakness or numbness. Psychiatric:Anxiety and panic attacks  10-point ROS otherwise negative.  ____________________________________________   PHYSICAL EXAM:  VITAL SIGNS: ED Triage Vitals  Enc Vitals Group     BP 01/15/15 0240 124/75 mmHg     Pulse Rate 01/15/15 0240 84     Resp 01/15/15 0240 16     Temp 01/15/15 0240 98.1 F (36.7 C)     Temp Source 01/15/15 0240 Oral     SpO2 01/15/15 0240 98 %     Weight 01/15/15 0240 208 lb (94.348 kg)     Height 01/15/15 0240 5' (1.524 m)     Head Cir --      Peak Flow --      Pain Score --      Pain Loc --      Pain Edu? --      Excl. in [place]? --     Constitutional: Alert and oriented. Well appearing and in mild distress. Eyes: Conjunctivae are normal. PERRL. EOMI. Head: Atraumatic. Nose: No congestion/rhinnorhea. Mouth/Throat: Mucous membranes are moist.  Oropharynx non-erythematous. Neck: No stridor.   Cardiovascular: Normal rate, regular rhythm. Grossly normal heart sounds.  Good peripheral circulation. Respiratory: Normal respiratory effort.  No retractions. Lungs CTAB. Gastrointestinal: Soft and nontender. No distention. Positive bowel sounds Genitourinary: Deferred Musculoskeletal: No lower extremity tenderness nor edema.   Neurologic:  Normal speech and language. No gross focal neurologic deficits are appreciated.  Skin:  Skin is warm, dry and intact. No rash noted. Psychiatric: Mood and affect are normal.    ____________________________________________   LABS (all labs ordered are listed, but only abnormal results are displayed)  Labs Reviewed - No data to display ____________________________________________  EKG  None ____________________________________________  RADIOLOGY  None ____________________________________________   PROCEDURES  Procedure(s) performed: None  Critical Care performed: No  ____________________________________________   INITIAL IMPRESSION / ASSESSMENT AND PLAN / ED COURSE  Pertinent labs & imaging results that were available during my care of the patient were reviewed by me and considered in my medical decision making (see chart for details).  This is a 29 year old female who comes in with anxiety after being off of her anxiety medication for 1 week. The patient reports that she would like a refill for her medication but I did inform her that since we have no way of getting in touch with her physician and we would not be able to follow her that is better that she establish care side of the emergency department to obtain refill prescriptions for her anxiety medication. I did inform her that I will give her 1 dose of Ativan here in the emergency department but then she would really need to follow up with a physician that she could see on a regular basis for her medication. Patient does not have any pain at this time is not having any other concerns. These symptoms are typical of the patient's anxiety symptoms which she has had for 3 years. ____________________________________________   FINAL CLINICAL IMPRESSION(S) / ED DIAGNOSES  Final diagnoses:  Anxiety  Panic attacks      Loney Hering, MD 01/15/15 [phone]

## 2015-01-15 NOTE — ED Notes (Signed)
Pt. States feeling very restless while visiting family in [place]. Pt. States unable to get rest. Pt. States running out of her medication while on vacation.  Pt. In no distress at this time. A/O x4 at this time.

## 2015-01-15 NOTE — Discharge Instructions (Signed)
Social Anxiety Disorder Social anxiety disorder, previously called social phobia, is a mental disorder. People with social anxiety disorder frequently feel nervous, afraid, or embarrassed when around other people in social situations. They constantly worry that other people are judging or criticizing them for how they look, what they say, or how they act. They may worry that other people might reject them because of their appearance or behavior. Social anxiety disorder is more than just occasional shyness or self-consciousness. It can cause severe emotional distress. It can interfere with daily life activities. Social anxiety disorder also may lead to excessive alcohol or drug use and even suicide.  Social anxiety disorder is actually one of the most common mental disorders. It can develop at any time but usually starts in the teenage years. Women are more commonly affected than men. Social anxiety disorder is also more common in people who have family members with anxiety disorders. It also is more common in people who have physical deformities or conditions with characteristics that are obvious to others, such as stuttered speech or movement abnormalities (Parkinson disease).  SYMPTOMS  In addition to feeling anxious or fearful in social situations, people with social anxiety disorder frequently have physical symptoms. Examples include:  Red face (blushing).  Racing heart.  Sweating.  Shaky hands or voice.  Confusion.  Light-headedness.  Upset stomach and diarrhea. DIAGNOSIS  Social anxiety disorder is diagnosed through an assessment by your health care provider. Your health care provider will ask you questions about your mood, thoughts, and reactions in social situations. Your health care provider may ask you about your medical history and use of alcohol or drugs, including prescription medicines. Certain medical conditions and the use of certain substances, including caffeine, can cause  symptoms similar to social anxiety disorder. Your health care provider may refer you to a mental health specialist for further evaluation or treatment. The criteria for diagnosis of social anxiety disorder are:  Marked fear or anxiety in one or more social situations in which you may be closely watched or studied by others. Examples of such situations include:  Interacting socially (having a conversation with others, going to a party, or meeting strangers).  Being observed (eating or drinking in public or being called on in class).  Performing in front of others (giving a speech).  The social situations of concern almost always cause fear or anxiety, not just occasionally.  People with social anxiety disorder fear that they will be viewed negatively in a way that will be embarrassing, will lead to rejection, or will offend others. This fear is out of proportion to the actual threat posed by the social situation.  Often the triggering social situations are avoided, or they are endured with intense fear or anxiety. The fear, anxiety, or avoidance is persistent and lasts for 6 months or longer.  The anxiety causes difficulty functioning in at least some parts of your daily life. TREATMENT  Several types of treatment are available for social anxiety disorder. These treatments are often used in combination and include:   Talk therapy. Group talk therapy allows you to see that you are not alone with these problems. Individual talk therapy helps you address your specific anxiety issues with a caring professional. The most effective forms of talk therapy for social anxiety disorder are cognitive-behavioral therapy and exposure therapy. Cognitive-behavioral therapy helps you to identify and change negative thoughts and beliefs that are at the root of the disorder. Exposure therapy allows you to gradually face the situations  that you fear most.  Relaxation and coping techniques. These include deep  breathing, self-talk, meditation, visual imagery, and yoga. Relaxation techniques help to keep you calm in social situations.  Social Company secretary.Social skills can be learned on your own or with the help of a talk therapist. They can help you feel more confident and comfortable in social situations.  Medicine. For anxiety limited to performance situations (performance anxiety), medicine called beta blockers can help by reducing or preventing the physical symptoms of social anxiety disorder. For more persistent and generalized social anxiety, antidepressant medicine may be prescribed to help control symptoms. In severe cases of social anxiety disorder, strong antianxiety medicine, called benzodiazepines, may be prescribed on a limited basis and for a short time. Document Released: 06/14/2005 Document Revised: 11/30/2013 Document Reviewed: 10/14/2012 [facility] Patient Information 2015 [place], [place]. This information is not intended to replace advice given to you by your health care provider. Make sure you discuss any questions you have with your health care provider.  Panic Attacks Panic attacks are sudden, short-livedsurges of severe anxiety, fear, or discomfort. They may occur for no reason when you are relaxed, when you are anxious, or when you are sleeping. Panic attacks may occur for a number of reasons:   Healthy people occasionally have panic attacks in extreme, life-threatening situations, such as war or natural disasters. Normal anxiety is a protective mechanism of the body that helps [place] react to danger (fight or flight response).  Panic attacks are often seen with anxiety disorders, such as panic disorder, social anxiety disorder, generalized anxiety disorder, and phobias. Anxiety disorders cause excessive or uncontrollable anxiety. They may interfere with your relationships or other life activities.  Panic attacks are sometimes seen with other mental illnesses, such as depression and  posttraumatic stress disorder.  Certain medical conditions, prescription medicines, and drugs of abuse can cause panic attacks. SYMPTOMS  Panic attacks start suddenly, peak within 20 minutes, and are accompanied by four or more of the following symptoms:  Pounding heart or fast heart rate (palpitations).  Sweating.  Trembling or shaking.  Shortness of breath or feeling smothered.  Feeling choked.  Chest pain or discomfort.  Nausea or strange feeling in your stomach.  Dizziness, light-headedness, or feeling like you will faint.  Chills or hot flushes.  Numbness or tingling in your lips or hands and feet.  Feeling that things are not real or feeling that you are not yourself.  Fear of losing control or going crazy.  Fear of dying. Some of these symptoms can mimic serious medical conditions. For example, you may think you are having a heart attack. Although panic attacks can be very scary, they are not life threatening. DIAGNOSIS  Panic attacks are diagnosed through an assessment by your health care provider. Your health care provider will ask questions about your symptoms, such as where and when they occurred. Your health care provider will also ask about your medical history and use of alcohol and drugs, including prescription medicines. Your health care provider may order blood tests or other studies to rule out a serious medical condition. Your health care provider may refer you to a mental health professional for further evaluation. TREATMENT   Most healthy people who have one or two panic attacks in an extreme, life-threatening situation will not require treatment.  The treatment for panic attacks associated with anxiety disorders or other mental illness typically involves counseling with a mental health professional, medicine, or a combination of both. Your health care provider  will help determine what treatment is best for you.  Panic attacks due to physical illness  usually go away with treatment of the illness. If prescription medicine is causing panic attacks, talk with your health care provider about stopping the medicine, decreasing the dose, or substituting another medicine.  Panic attacks due to alcohol or drug abuse go away with abstinence. Some adults need professional help in order to stop drinking or using drugs. HOME CARE INSTRUCTIONS   Take all medicines as directed by your health care provider.   Schedule and attend follow-up visits as directed by your health care provider. It is important to keep all your appointments. SEEK MEDICAL CARE IF:  You are not able to take your medicines as prescribed.  Your symptoms do not improve or get worse. SEEK IMMEDIATE MEDICAL CARE IF:   You experience panic attack symptoms that are different than your usual symptoms.  You have serious thoughts about hurting yourself or others.  You are taking medicine for panic attacks and have a serious side effect. MAKE SURE YOU:  Understand these instructions.  Will watch your condition.  Will get help right away if you are not doing well or get worse. Document Released: 07/16/2005 Document Revised: 07/21/2013 Document Reviewed: 02/27/2013 [facility] Patient Information 2015 [place], [place]. This information is not intended to replace advice given to you by your health care provider. Make sure you discuss any questions you have with your health care provider.

## 2015-01-15 NOTE — ED Notes (Signed)
Pt. States she is down here from [place] visiting family and has not been able to sleep.  Pt. States she has had anxiety problems for the past couple of years.  Pt. States the "axiety was too much for me tonight.

## 2016-04-19 ENCOUNTER — Encounter: Payer: Self-pay | Admitting: Emergency Medicine

## 2016-04-19 ENCOUNTER — Emergency Department
Admission: EM | Admit: 2016-04-19 | Discharge: 2016-04-19 | Disposition: A | Discharge status: Home / Self Care | Payer: Medicaid Other | Attending: Emergency Medicine | Admitting: Emergency Medicine

## 2016-04-19 DIAGNOSIS — S39012A Strain of muscle, fascia and tendon of lower back, initial encounter: Secondary | ICD-10-CM

## 2016-04-19 DIAGNOSIS — Y9289 Other specified places as the place of occurrence of the external cause: Secondary | ICD-10-CM | POA: Diagnosis not present

## 2016-04-19 DIAGNOSIS — Y990 Civilian activity done for income or pay: Secondary | ICD-10-CM | POA: Diagnosis not present

## 2016-04-19 DIAGNOSIS — X501XXA Overexertion from prolonged static or awkward postures, initial encounter: Secondary | ICD-10-CM | POA: Insufficient documentation

## 2016-04-19 DIAGNOSIS — Y9389 Activity, other specified: Secondary | ICD-10-CM | POA: Insufficient documentation

## 2016-04-19 DIAGNOSIS — S3992XA Unspecified injury of lower back, initial encounter: Secondary | ICD-10-CM | POA: Diagnosis present

## 2016-04-19 DIAGNOSIS — T148XXA Other injury of unspecified body region, initial encounter: Secondary | ICD-10-CM

## 2016-04-19 MED ORDER — CYCLOBENZAPRINE HCL 5 MG PO TABS: 5.0000 mg | ORAL_TABLET | Freq: Every evening | ORAL | 0 refills | Status: DC | PRN

## 2016-04-19 MED ORDER — NAPROXEN 500 MG PO TBEC: 500.0000 mg | DELAYED_RELEASE_TABLET | Freq: Two times a day (BID) | ORAL | 0 refills | Status: DC

## 2016-04-19 NOTE — Discharge Instructions (Signed)
°  Take the prescription meds as directed. Consider applying ice therapy and/or most heat therapy to the muscles for relief. Also consider the use of compression socks as well as a more rigid work shoe. Follow-up with Ms. Cherlynn Polo for ongoing symptom management.
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
## 2016-04-19 NOTE — ED Triage Notes (Signed)
Pt with back pain radiates up into her back. Pt with no acute distress noted. Skin warm and dry. Pt c/o pain going on for years.

## 2016-04-23 ENCOUNTER — Inpatient Hospital Stay
Admission: EM | Admit: 2016-04-23 | Discharge: 2016-04-25 | DRG: 378 | Disposition: A | Discharge status: Home / Self Care | Payer: Medicaid Other | Attending: Internal Medicine | Admitting: Internal Medicine

## 2016-04-23 DIAGNOSIS — D62 Acute posthemorrhagic anemia: Secondary | ICD-10-CM | POA: Diagnosis present

## 2016-04-23 DIAGNOSIS — K219 Gastro-esophageal reflux disease without esophagitis: Secondary | ICD-10-CM | POA: Diagnosis present

## 2016-04-23 DIAGNOSIS — E119 Type 2 diabetes mellitus without complications: Secondary | ICD-10-CM

## 2016-04-23 DIAGNOSIS — J45909 Unspecified asthma, uncomplicated: Secondary | ICD-10-CM | POA: Diagnosis present

## 2016-04-23 DIAGNOSIS — Z818 Family history of other mental and behavioral disorders: Secondary | ICD-10-CM

## 2016-04-23 DIAGNOSIS — Z825 Family history of asthma and other chronic lower respiratory diseases: Secondary | ICD-10-CM | POA: Diagnosis not present

## 2016-04-23 DIAGNOSIS — Z79899 Other long term (current) drug therapy: Secondary | ICD-10-CM

## 2016-04-23 DIAGNOSIS — D500 Iron deficiency anemia secondary to blood loss (chronic): Secondary | ICD-10-CM | POA: Diagnosis present

## 2016-04-23 DIAGNOSIS — Z9851 Tubal ligation status: Secondary | ICD-10-CM

## 2016-04-23 DIAGNOSIS — K5900 Constipation, unspecified: Secondary | ICD-10-CM | POA: Diagnosis present

## 2016-04-23 DIAGNOSIS — K921 Melena: Secondary | ICD-10-CM

## 2016-04-23 DIAGNOSIS — D649 Anemia, unspecified: Secondary | ICD-10-CM

## 2016-04-23 DIAGNOSIS — Z885 Allergy status to narcotic agent status: Secondary | ICD-10-CM

## 2016-04-23 DIAGNOSIS — K920 Hematemesis: Principal | ICD-10-CM | POA: Diagnosis present

## 2016-04-23 HISTORY — DX: Anxiety disorder, unspecified: F41.9

## 2016-04-23 HISTORY — DX: Gastro-esophageal reflux disease without esophagitis: K21.9

## 2016-04-23 HISTORY — DX: Type 2 diabetes mellitus without complications: E11.9

## 2016-04-23 HISTORY — DX: Unspecified asthma, uncomplicated: J45.909

## 2016-04-23 HISTORY — DX: Depression, unspecified: F32.A

## 2016-04-23 HISTORY — DX: Major depressive disorder, single episode, unspecified: F32.9

## 2016-04-23 HISTORY — DX: Anemia, unspecified: D64.9

## 2016-04-23 LAB — COMPREHENSIVE METABOLIC PANEL
ALK PHOS: 50 U/L (ref 38–126)
ALT: 19 U/L (ref 14–54)
ANION GAP: 6 (ref 5–15)
AST: 23 U/L (ref 15–41)
Albumin: 3.6 g/dL (ref 3.5–5.0)
BUN: 8 mg/dL (ref 6–20)
CALCIUM: 8.7 mg/dL — AB (ref 8.9–10.3)
CO2: 27 mmol/L (ref 22–32)
CREATININE: 0.72 mg/dL (ref 0.44–1.00)
Chloride: 104 mmol/L (ref 101–111)
Glucose, Bld: 127 mg/dL — ABNORMAL HIGH (ref 65–99)
Potassium: 3.2 mmol/L — ABNORMAL LOW (ref 3.5–5.1)
Sodium: 137 mmol/L (ref 135–145)
Total Bilirubin: 0.1 mg/dL — ABNORMAL LOW (ref 0.3–1.2)
Total Protein: 6.8 g/dL (ref 6.5–8.1)

## 2016-04-23 LAB — FERRITIN: Ferritin: 6 ng/mL — ABNORMAL LOW (ref 11–307)

## 2016-04-23 LAB — CBC
HCT: 17.9 % — ABNORMAL LOW (ref 35.0–47.0)
HEMOGLOBIN: 5.2 g/dL — AB (ref 12.0–16.0)
MCH: 18.8 pg — ABNORMAL LOW (ref 26.0–34.0)
MCHC: 29.3 g/dL — ABNORMAL LOW (ref 32.0–36.0)
MCV: 64.2 fL — ABNORMAL LOW (ref 80.0–100.0)
PLATELETS: 480 10*3/uL — AB (ref 150–440)
RBC: 2.79 MIL/uL — AB (ref 3.80–5.20)
RDW: 21.9 % — ABNORMAL HIGH (ref 11.5–14.5)
WBC: 9.7 10*3/uL (ref 3.6–11.0)

## 2016-04-23 LAB — URINALYSIS COMPLETE WITH MICROSCOPIC (ARMC ONLY)
Bilirubin Urine: NEGATIVE
Glucose, UA: NEGATIVE mg/dL
HGB URINE DIPSTICK: NEGATIVE
KETONES UR: NEGATIVE mg/dL
Nitrite: NEGATIVE
PH: 6 (ref 5.0–8.0)
Protein, ur: NEGATIVE mg/dL
Specific Gravity, Urine: 1.01 (ref 1.005–1.030)

## 2016-04-23 LAB — POCT PREGNANCY, URINE: Preg Test, Ur: NEGATIVE

## 2016-04-23 LAB — ABO/RH: ABO/RH(D): B NEG

## 2016-04-23 LAB — IRON AND TIBC
Iron: 11 ug/dL — ABNORMAL LOW (ref 28–170)
SATURATION RATIOS: 3 % — AB (ref 10.4–31.8)
TIBC: 423 ug/dL (ref 250–450)
UIBC: 412 ug/dL

## 2016-04-23 LAB — LIPASE, BLOOD: Lipase: 48 U/L (ref 11–51)

## 2016-04-23 LAB — GLUCOSE, CAPILLARY: Glucose-Capillary: 130 mg/dL — ABNORMAL HIGH (ref 65–99)

## 2016-04-23 LAB — VITAMIN B12: VITAMIN B 12: 425 pg/mL (ref 180–914)

## 2016-04-23 LAB — PREPARE RBC (CROSSMATCH)

## 2016-04-23 LAB — TROPONIN I: Troponin I: <0.03 ng/mL (ref <0.03)

## 2016-04-23 LAB — TRANSFERRIN: TRANSFERRIN: 314 mg/dL (ref 192–382)

## 2016-04-23 MED ORDER — DIPHENHYDRAMINE HCL 25 MG PO CAPS
25.0000 mg | ORAL_CAPSULE | Freq: Once | ORAL | Status: AC
  Administered 2016-04-23: 25 mg via ORAL
  Filled 2016-04-23: qty 1

## 2016-04-23 MED ORDER — ONDANSETRON HCL 4 MG/2ML IJ SOLN: 4.0000 mg | Freq: Four times a day (QID) | INTRAMUSCULAR | Status: DC | PRN

## 2016-04-23 MED ORDER — INSULIN ASPART 100 UNIT/ML ~~LOC~~ SOLN: 0.0000 [IU] | Freq: Four times a day (QID) | SUBCUTANEOUS | Status: DC

## 2016-04-23 MED ORDER — SODIUM CHLORIDE 0.9 % IV SOLN
Freq: Once | INTRAVENOUS | Status: AC
  Administered 2016-04-23: 10 mL/h via INTRAVENOUS

## 2016-04-23 MED ORDER — SODIUM CHLORIDE 0.9 % IV SOLN
INTRAVENOUS | Status: DC
  Administered 2016-04-23: 75 mL/h via INTRAVENOUS

## 2016-04-23 MED ORDER — ONDANSETRON HCL 4 MG PO TABS: 4.0000 mg | ORAL_TABLET | Freq: Four times a day (QID) | ORAL | Status: DC | PRN

## 2016-04-23 MED ORDER — ZOLPIDEM TARTRATE 5 MG PO TABS: 5.0000 mg | ORAL_TABLET | Freq: Every evening | ORAL | Status: DC | PRN

## 2016-04-23 MED ORDER — PANTOPRAZOLE SODIUM 40 MG PO TBEC
40.0000 mg | DELAYED_RELEASE_TABLET | Freq: Two times a day (BID) | ORAL | Status: DC
  Administered 2016-04-23: 40 mg via ORAL
  Filled 2016-04-23: qty 1

## 2016-04-23 MED ORDER — SODIUM CHLORIDE 0.9 % IV SOLN
10.0000 mL/h | Freq: Once | INTRAVENOUS | Status: AC
Start: 2016-04-23 — End: 2016-04-24
  Administered 2016-04-23: 10 mL/h via INTRAVENOUS

## 2016-04-23 NOTE — ED Notes (Signed)
Hooked pt up to monitor.

## 2016-04-23 NOTE — H&P (Signed)
[place] [facility] [place] NAME: Lori Bentley    MR#:  [id number]  DATE OF BIRTH:  [birth year]  DATE OF ADMISSION:  04/23/2016  PRIMARY CARE PHYSICIAN: Freddy Finner, NP   REQUESTING/REFERRING PHYSICIAN: Karma Greaser, MD  CHIEF COMPLAINT:   Chief Complaint  Patient presents with  . Hematemesis  . Constipation    HISTORY OF PRESENT ILLNESS:  Lori Bentley  is a 30 y.o. female who presents with 2 episodes of hematemesis and an episode of melena over the past 4-5 days. Patient states that she had been taking naproxen as well as BC powders with some frequency for back pain. Here in the ED she was found to have hemoglobin of 5. She has been having some symptoms of fatigue. Hospitals were called for admission  PAST MEDICAL HISTORY:   Past Medical History:  Diagnosis Date  . Anxiety   . Asthma   . Depression   . Diabetes ([place])   . GERD (gastroesophageal reflux disease)     PAST SURGICAL HISTORY:   Past Surgical History:  Procedure Laterality Date  . TUBAL LIGATION      SOCIAL HISTORY:   Social History  Substance Use Topics  . Smoking status: Never Smoker  . Smokeless tobacco: Never Used  . Alcohol use No    FAMILY HISTORY:   Family History  Problem Relation Age of Onset  . Anxiety disorder Mother   . Asthma Mother   . GER disease Father     DRUG ALLERGIES:   Allergies  Allergen Reactions  . Percocet [Oxycodone-Acetaminophen] Swelling    MEDICATIONS AT HOME:   Prior to Admission medications   Medication Sig Start Date End Date Taking? Authorizing Provider  cyclobenzaprine (FLEXERIL) 5 MG tablet Take 1 tablet (5 mg total) by mouth at bedtime as needed for muscle spasms. 04/19/16   Jenise V Bacon Menshew, PA-C  naproxen (EC NAPROSYN) 500 MG EC tablet Take 1 tablet (500 mg total) by mouth 2 (two) times daily with a meal. 04/19/16   Jenise V Bacon Menshew, PA-C    REVIEW OF SYSTEMS:  Review of Systems   Constitutional: Positive for malaise/fatigue. Negative for chills, fever and weight loss.  HENT: Negative for ear pain, hearing loss and tinnitus.   Eyes: Negative for blurred vision, double vision, pain and redness.  Respiratory: Negative for cough, hemoptysis and shortness of breath.   Cardiovascular: Negative for chest pain, palpitations, orthopnea and leg swelling.  Gastrointestinal: Positive for heartburn, melena and nausea. Negative for abdominal pain, constipation, diarrhea and vomiting.  Genitourinary: Negative for dysuria, frequency and hematuria.  Musculoskeletal: Negative for back pain, joint pain and neck pain.  Skin:       No acne, rash, or lesions  Neurological: Negative for dizziness, tremors, focal weakness and weakness.  Endo/Heme/Allergies: Negative for polydipsia. Does not bruise/bleed easily.  Psychiatric/Behavioral: Negative for depression. The patient is not nervous/anxious and does not have insomnia.      VITAL SIGNS:   Vitals:   04/23/16 1841 04/23/16 1842 04/23/16 2055  BP: 118/74  130/79  Pulse: 87  99  Resp: 18  16  Temp: 98.1 F (36.7 C)    TempSrc: Oral    SpO2: 100%  100%  Weight:  92.5 kg (204 lb)   Height:  5' (1.524 m)    Wt Readings from Last 3 Encounters:  04/23/16 92.5 kg (204 lb)  04/19/16 92.5 kg (204 lb)  01/15/15 94.3 kg (208 lb)  PHYSICAL EXAMINATION:  Physical Exam  Vitals reviewed. Constitutional: She is oriented to person, place, and time. She appears well-developed and well-nourished. No distress.  HENT:  Head: Normocephalic and atraumatic.  Mouth/Throat: Oropharynx is clear and moist.  Eyes: Conjunctivae and EOM are normal. Pupils are equal, round, and reactive to light. No scleral icterus.  Neck: Normal range of motion. Neck supple. No JVD present. No thyromegaly present.  Cardiovascular: Normal rate, regular rhythm and intact distal pulses.  Exam reveals no gallop and no friction rub.   No murmur heard. Respiratory:  Effort normal and breath sounds normal. No respiratory distress. She has no wheezes. She has no rales.  GI: Soft. Bowel sounds are normal. She exhibits no distension. There is no tenderness.  Musculoskeletal: Normal range of motion. She exhibits no edema.  No arthritis, no gout  Lymphadenopathy:    She has no cervical adenopathy.  Neurological: She is alert and oriented to person, place, and time. No cranial nerve deficit.  No dysarthria, no aphasia  Skin: Skin is warm and dry. No rash noted. No erythema.  Psychiatric: She has a normal mood and affect. Her behavior is normal. Judgment and thought content normal.    LABORATORY PANEL:   CBC  Recent Labs Lab 04/23/16 1852  WBC 9.7  HGB 5.2*  HCT 17.9*  PLT 480*   ------------------------------------------------------------------------------------------------------------------  Chemistries   Recent Labs Lab 04/23/16 1852  NA 137  K 3.2*  CL 104  CO2 27  GLUCOSE 127*  BUN 8  CREATININE 0.72  CALCIUM 8.7*  AST 23  ALT 19  ALKPHOS 50  BILITOT 0.1*   ------------------------------------------------------------------------------------------------------------------  Cardiac Enzymes  Recent Labs Lab 04/23/16 1930  TROPONINI <0.03   ------------------------------------------------------------------------------------------------------------------  RADIOLOGY:  No results found.  EKG:   Orders placed or performed during the hospital encounter of 04/23/16  . ED EKG  . ED EKG  . EKG 12-Lead  . EKG 12-Lead    IMPRESSION AND PLAN:  Principal Problem:   Blood loss anemia - we'll transfuse 2 units of PRBC tonight. Suspect this is due to GI pathology, possibly related to her NSAID use. We'll have her on twice a day PPI for now Active Problems:   Hematemesis - due to likely GI bleed as above, when necessary antiemetics on board, other treatment as above   Melena - one episode of the same, she has had normal looking  stool since then. Treatment as above. GI consult.   Diabetes ([place]) - sliding scale insulin with corresponding glucose checks every 6 hours for now while she is nothing by mouth   GERD (gastroesophageal reflux disease) - twice a day PPI as above  All the records are reviewed and case discussed with ED provider. Management plans discussed with the patient and/or family.  DVT PROPHYLAXIS: Mechanical only  GI PROPHYLAXIS: PPI  ADMISSION STATUS: Inpatient  CODE STATUS: Full Code Status History    This patient does not have a recorded code status. Please follow your organizational policy for patients in this situation.      TOTAL TIME TAKING CARE OF THIS PATIENT: 45 minutes.    Alys Dulak Rampart 04/23/2016, 9:01 PM  [facility]  Office  [phone]  CC: Primary care physician; Freddy Finner, NP

## 2016-04-23 NOTE — ED Provider Notes (Signed)
[facility] Emergency Department Provider Note  ____________________________________________   First MD Initiated Contact with Patient 04/23/16 2007     (approximate)  I have reviewed the triage vital signs and the nursing notes.   HISTORY  Chief Complaint Hematemesis and Constipation    HPI Lori Bentley is a 30 y.o. female with no significant past medical historywho presents for evaluation of a constellation of symptoms.  She was seen here last Thursday (6 days ago ) with back pain and prescribed muscle relaxers, she states that for probably several weeks now she has felt gradually worsening of general malaise, fatigue, and generalized weakness.  Those sensations and continue to get worse and nothing helps and nothing makes it worse.  2 days ago she felt ill with nausea and the need to go to the bathroom and she simultaneously had a large volume bowel movement as well as a large volume episode of emesis that she describes as dark and chunky (coffee-ground emesis).  That has not happened since 2 days ago, but she also states she has not had a normal bowel movement.  Her bowel movement 2 days ago was dark as well.  She continues to feel generally ill but denies chest pain, shortness of breath, fever/chills.  She states that she has some vague discomfort in her abdomen but cannot describe it very well.  Overall she describes her symptoms as severe.  Report that she has a history of iron deficiency anemia for which she does take iron supplements, but she cannot remember what her usual hemoglobin is, and there is no record in the computer in our system or in care everywhere.   Past Medical History:  Diagnosis Date  . Anxiety   . Asthma   . Chronic anemia   . Depression   . Diabetes ([place])   . GERD (gastroesophageal reflux disease)     Patient Active Problem List   Diagnosis Date Noted  . Blood loss anemia 04/23/2016  . Hematemesis 04/23/2016  . Melena  04/23/2016  . Diabetes ([place]) 04/23/2016  . GERD (gastroesophageal reflux disease) 04/23/2016    Past Surgical History:  Procedure Laterality Date  . TUBAL LIGATION      Prior to Admission medications   Medication Sig Start Date End Date Taking? Authorizing Provider  albuterol (PROVENTIL HFA;VENTOLIN HFA) 108 (90 Base) MCG/ACT inhaler Inhale 2 puffs into the lungs every 6 (six) hours as needed for wheezing. 09/01/14  Yes Historical Provider, MD  cholecalciferol (VITAMIN D) 1000 units tablet Take 1,000 Units by mouth daily.   Yes Historical Provider, MD  escitalopram (LEXAPRO) 20 MG tablet Take 20 mg by mouth daily. 12/10/14  Yes Historical Provider, MD  ferrous sulfate 325 (65 FE) MG tablet Take 325 mg by mouth daily.   Yes Historical Provider, MD    Allergies Percocet [oxycodone-acetaminophen]  Family History  Problem Relation Age of Onset  . Anxiety disorder Mother   . Asthma Mother   . GER disease Father     Social History Social History  Substance Use Topics  . Smoking status: Never Smoker  . Smokeless tobacco: Never Used  . Alcohol use No    Review of Systems Constitutional: No fever/chills.  Gen. malaise, fatigue, weakness Eyes: No visual changes. ENT: No sore throat. Cardiovascular: Denies chest pain. Respiratory: Denies shortness of breath. Gastrointestinal: Vague generalized abdominal pain.  One episode of coffee-ground emesis 2 days ago and one episode of dark stools.  Constipation for the last 2 days.  Genitourinary: Negative for dysuria. Musculoskeletal: Subacute/chronic low back pain. Skin: Negative for rash. Neurological: Negative for headaches, focal weakness or numbness, but general malaise/fatigue/weakness  10-point ROS otherwise negative.  ____________________________________________   PHYSICAL EXAM:  VITAL SIGNS: ED Triage Vitals  Enc Vitals Group     BP 04/23/16 1841 118/74     Pulse Rate 04/23/16 1841 87     Resp 04/23/16 1841 18     Temp  04/23/16 1841 98.1 F (36.7 C)     Temp Source 04/23/16 1841 Oral     SpO2 04/23/16 1841 100 %     Weight 04/23/16 1842 204 lb (92.5 kg)     Height 04/23/16 1842 5' (1.524 m)     Head Circumference --      Peak Flow --      Pain Score 04/23/16 1849 10     Pain Loc --      Pain Edu? --      Excl. in [place]? --     Constitutional: Alert and oriented. Well appearing and in no acute distress. Eyes: Conjunctivae are normal. PERRL. EOMI. Head: Atraumatic. Nose: No congestion/rhinnorhea. Mouth/Throat: Mucous membranes are moist.  Oropharynx non-erythematous. Neck: No stridor.  No meningeal signs.   Cardiovascular: Normal rate, regular rhythm. Good peripheral circulation. Grossly normal heart sounds. Respiratory: Normal respiratory effort.  No retractions. Lungs CTAB. Gastrointestinal: Soft and nontender. No distention.  Rectal:  Maroon-tinged stool.  Strongly heme-positive, QC passed.  Non-tender.  Normal external exam.  Chaperone present throughout exam.   Musculoskeletal: No lower extremity tenderness nor edema. No gross deformities of extremities. Neurologic:  Normal speech and language. No gross focal neurologic deficits are appreciated.  Skin:  Skin is warm, dry and intact. No rash noted. Psychiatric: Mood and affect are normal. Speech and behavior are normal.  ____________________________________________   LABS (all labs ordered are listed, but only abnormal results are displayed)  Labs Reviewed  COMPREHENSIVE METABOLIC PANEL - Abnormal; Notable for the following:       Result Value   Potassium 3.2 (*)    Glucose, Bld 127 (*)    Calcium 8.7 (*)    Total Bilirubin 0.1 (*)    All other components within normal limits  CBC - Abnormal; Notable for the following:    RBC 2.79 (*)    Hemoglobin 5.2 (*)    HCT 17.9 (*)    MCV 64.2 (*)    MCH 18.8 (*)    MCHC 29.3 (*)    RDW 21.9 (*)    Platelets 480 (*)    All other components within normal limits  URINALYSIS COMPLETEWITH  MICROSCOPIC ([facility] ONLY) - Abnormal; Notable for the following:    Color, Urine YELLOW (*)    APPearance CLOUDY (*)    Leukocytes, UA 3+ (*)    Bacteria, UA RARE (*)    Squamous Epithelial / LPF 6-30 (*)    All other components within normal limits  FERRITIN - Abnormal; Notable for the following:    Ferritin 6 (*)    All other components within normal limits  IRON AND TIBC - Abnormal; Notable for the following:    Iron 11 (*)    Saturation Ratios 3 (*)    All other components within normal limits  GLUCOSE, CAPILLARY - Abnormal; Notable for the following:    Glucose-Capillary 130 (*)    All other components within normal limits  URINE CULTURE  LIPASE, BLOOD  TROPONIN I  VITAMIN B12  TRANSFERRIN  BASIC  METABOLIC PANEL  CBC  POC URINE PREG, ED  POCT PREGNANCY, URINE  PREPARE RBC (CROSSMATCH)  TYPE AND SCREEN  ABO/RH  PREPARE RBC (CROSSMATCH)   ____________________________________________  EKG  None - EKG not ordered by ED physician ____________________________________________  RADIOLOGY   No results found.  ____________________________________________   PROCEDURES  Procedure(s) performed:   Procedures   Critical Care performed: No ____________________________________________   INITIAL IMPRESSION / ASSESSMENT AND PLAN / ED COURSE  Pertinent labs & imaging results that were available during my care of the patient were reviewed by me and considered in my medical decision making (see chart for details).  The patient has profound anemia with a hemoglobin of 5.2 and symptoms of symptomatic anemia (generalized weakness and fatigue).  Additionally she had an episode of coffee-ground emesis 2 days ago and has heme positive and Maroon-colored stool today.  Her low MCV suggests chronic anemia due to iron deficiency but she is also having a GI bleed and is symptomatic.  As results believe she would benefit from inpatient care.  I have ordered 2 units of blood for her but  since they will not give type specific blood in the emergency department I recommended to the hospitalist that the blood not be administered until she gets upstairs.  I had my usual and customary blood transfusion discussion of risks and benefits with her.  At the request of the hospitalist also spoke by phone with Dr. Allen Norris because we do not have GI coverage tomorrow, but he thought that the earliest that she would need to be scoped would be 2 days from now or we do have GI coverage.  The hospitalist was fine with this plan.    ____________________________________________  FINAL CLINICAL IMPRESSION(S) / ED DIAGNOSES  Final diagnoses:  Symptomatic anemia  Gastrointestinal hemorrhage with melena     MEDICATIONS GIVEN DURING THIS VISIT:  Medications  0.9 %  sodium chloride infusion (75 mL/hr Intravenous New Bag/Given 04/23/16 2311)  ondansetron (ZOFRAN) tablet 4 mg (not administered)    Or  ondansetron (ZOFRAN) injection 4 mg (not administered)  pantoprazole (PROTONIX) EC tablet 40 mg (40 mg Oral Given 04/23/16 2311)  insulin aspart (novoLOG) injection 0-9 Units (0 Units Subcutaneous Not Given 04/23/16 2313)  zolpidem (AMBIEN) tablet 5 mg (not administered)  0.9 %  sodium chloride infusion (not administered)  0.9 %  sodium chloride infusion (10 mL/hr Intravenous New Bag/Given 04/23/16 2125)  diphenhydrAMINE (BENADRYL) capsule 25 mg (25 mg Oral Given 04/23/16 2311)     NEW OUTPATIENT MEDICATIONS STARTED DURING THIS VISIT:  Current Discharge Medication List      Current Discharge Medication List      Current Discharge Medication List       Note:  This document was prepared using Dragon voice recognition software and may include unintentional dictation errors.    Hinda Kehr, MD 04/23/16 [phone]

## 2016-04-23 NOTE — ED Triage Notes (Signed)
Pt states she was seen here last Thursday with back pain and rx muscle relaxers , states Saturday she had sudden onset coffee ground emesis with a syncople episode, denies anymore vomiting since Saturday.. States she has not been able to have a BM since them either.

## 2016-04-24 LAB — GLUCOSE, CAPILLARY
GLUCOSE-CAPILLARY: 119 mg/dL — AB (ref 65–99)
GLUCOSE-CAPILLARY: 102 mg/dL — AB (ref 65–99)
Glucose-Capillary: 96 mg/dL (ref 65–99)
Glucose-Capillary: 102 mg/dL — ABNORMAL HIGH (ref 65–99)

## 2016-04-24 LAB — CBC
HEMATOCRIT: 24.1 % — AB (ref 35.0–47.0)
HEMOGLOBIN: 7.6 g/dL — AB (ref 12.0–16.0)
MCH: 21.7 pg — AB (ref 26.0–34.0)
MCHC: 31.3 g/dL — AB (ref 32.0–36.0)
MCV: 69.3 fL — AB (ref 80.0–100.0)
Platelets: 453 10*3/uL — ABNORMAL HIGH (ref 150–440)
RBC: 3.48 MIL/uL — ABNORMAL LOW (ref 3.80–5.20)
RDW: 25.6 % — ABNORMAL HIGH (ref 11.5–14.5)
WBC: 10.1 10*3/uL (ref 3.6–11.0)

## 2016-04-24 LAB — BASIC METABOLIC PANEL
ANION GAP: 6 (ref 5–15)
BUN: 7 mg/dL (ref 6–20)
CHLORIDE: 106 mmol/L (ref 101–111)
CO2: 27 mmol/L (ref 22–32)
Calcium: 8.5 mg/dL — ABNORMAL LOW (ref 8.9–10.3)
Creatinine, Ser: 0.83 mg/dL (ref 0.44–1.00)
GFR calc Af Amer: >60 mL/min (ref >60)
GFR calc non Af Amer: >60 mL/min (ref >60)
Glucose, Bld: 99 mg/dL (ref 65–99)
POTASSIUM: 3.7 mmol/L (ref 3.5–5.1)
SODIUM: 139 mmol/L (ref 135–145)

## 2016-04-24 MED ORDER — PANTOPRAZOLE SODIUM 40 MG IV SOLR: 40.0000 mg | Freq: Two times a day (BID) | INTRAVENOUS | Status: DC

## 2016-04-24 NOTE — Progress Notes (Signed)
Patient very upset and asking for "real food".  Nurse explained the importance of only being on clear liquid diet.  Patient stated "it feels like torture!  If I could eat real food I would chew it real slow." Dr. Lavetta Nielsen paged and notified of patient request.  No new orders obtained to advance diet.
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
## 2016-04-24 NOTE — Progress Notes (Signed)
[facility] [place] NAME: Lori Bentley    MRN#:  [id number]  DATE OF BIRTH:  12-10-1985  SUBJECTIVE:  Hospital Day: 1 day Lori Bentley is a 30 y.o. female presenting with Hematemesis and Constipation .   Overnight events: No acute overnight events, received blood transfusion Interval Events: Sleeping, easily arousable, no complaints states feeling hungry  REVIEW OF SYSTEMS:  CONSTITUTIONAL: No fever, fatigue or weakness.  EYES: No blurred or double vision.  EARS, NOSE, AND THROAT: No tinnitus or ear pain.  RESPIRATORY: No cough, shortness of breath, wheezing or hemoptysis.  CARDIOVASCULAR: No chest pain, orthopnea, edema.  GASTROINTESTINAL: No nausea, vomiting, diarrhea or abdominal pain.  GENITOURINARY: No dysuria, hematuria.  ENDOCRINE: No polyuria, nocturia,  HEMATOLOGY: No anemia, easy bruising or bleeding SKIN: No rash or lesion. MUSCULOSKELETAL: No joint pain or arthritis.   NEUROLOGIC: No tingling, numbness, weakness.  PSYCHIATRY: No anxiety or depression.   DRUG ALLERGIES:   Allergies  Allergen Reactions  . Percocet [Oxycodone-Acetaminophen] Swelling    VITALS:  Blood pressure 126/74, pulse 79, temperature 98 F (36.7 C), temperature source Axillary, resp. rate 18, height 5' (1.524 m), weight 92.5 kg (204 lb), last menstrual period 04/05/2016, SpO2 100 %.  PHYSICAL EXAMINATION:  VITAL SIGNS: Vitals:   04/24/16 0422 04/24/16 0743  BP: 122/84 126/74  Pulse: 79 79  Resp: 19 18  Temp: 98.8 F (37.1 C) 98 F (36.7 C)   GENERAL:30 y.o.female currently in no acute distress.  HEAD: Normocephalic, atraumatic.  EYES: Pupils equal, round, reactive to light. Extraocular muscles intact. No scleral icterus.  MOUTH: Moist mucosal membrane. Dentition intact. No abscess noted.  EAR, NOSE, THROAT: Clear without exudates. No external lesions.  NECK: Supple. No thyromegaly. No nodules. No JVD.  PULMONARY: Clear  to ascultation, without wheeze rails or rhonci. No use of accessory muscles, Good respiratory effort. good air entry bilaterally CHEST: Nontender to palpation.  CARDIOVASCULAR: S1 and S2. Regular rate and rhythm. No murmurs, rubs, or gallops. No edema. Pedal pulses 2+ bilaterally.  GASTROINTESTINAL: Soft, nontender, nondistended. No masses. Positive bowel sounds. No hepatosplenomegaly.  MUSCULOSKELETAL: No swelling, clubbing, or edema. Range of motion full in all extremities.  NEUROLOGIC: Cranial nerves II through XII are intact. No gross focal neurological deficits. Sensation intact. Reflexes intact.  SKIN: No ulceration, lesions, rashes, or cyanosis. Skin warm and dry. Turgor intact.  PSYCHIATRIC: Mood, affect within normal limits. The patient is awake, alert and oriented x 3. Insight, judgment intact.      LABORATORY PANEL:   CBC  Recent Labs Lab 04/24/16 0538  WBC 10.1  HGB 7.6*  HCT 24.1*  PLT 453*   ------------------------------------------------------------------------------------------------------------------  Chemistries   Recent Labs Lab 04/23/16 1852 04/24/16 0538  NA 137 139  K 3.2* 3.7  CL 104 106  CO2 27 27  GLUCOSE 127* 99  BUN 8 7  CREATININE 0.72 0.83  CALCIUM 8.7* 8.5*  AST 23  --   ALT 19  --   ALKPHOS 50  --   BILITOT 0.1*  --    ------------------------------------------------------------------------------------------------------------------  Cardiac Enzymes  Recent Labs Lab 04/23/16 1930  TROPONINI <0.03   ------------------------------------------------------------------------------------------------------------------  RADIOLOGY:  No results found.  EKG:   Orders placed or performed during the hospital encounter of 04/23/16  . ED EKG  . ED EKG  . EKG 12-Lead  . EKG 12-Lead    ASSESSMENT AND PLAN:   Lori Bentley is a 30 y.o. female presenting with  Hematemesis and Constipation . Admitted 04/23/2016 : Day #: 1 day 1. GI  bleed, upper: Start IV Protonix, GI consult pending, can start clear liquid diet Place nothing by mouth at midnight 2. Acute blood loss anemia: Hemoglobin stable 3. Type 2 diabetes hold oral agents continue sliding skill coverage   All the records are reviewed and case discussed with Care Management/Social Workerr. Management plans discussed with the patient, family and they are in agreement.  CODE STATUS: full TOTAL TIME TAKING CARE OF THIS PATIENT: 33 minutes.   POSSIBLE D/C IN 1-2DAYS, DEPENDING ON CLINICAL CONDITION.   Hower,  Karenann Cai.D on 04/24/2016 at 12:33 PM  Between 7am to 6pm - Pager - [phone]  After 6pm: House Pager: - [phone]  [facility]  Office  [phone]  CC: Primary care physician; Freddy Finner, NP

## 2016-04-24 NOTE — Progress Notes (Signed)
Nutrition Brief Note  Patient identified on the Malnutrition Screening Tool (MST) Report  Wt Readings from Last 15 Encounters:  04/23/16 204 lb (92.5 kg)  04/19/16 204 lb (92.5 kg)  01/15/15 208 lb (94.3 kg)   Spoke with pt, boyfriend at bedside. She endorses good appetite and PO intake except for Saturday when she had emesis. She also states she was on some sort of medication at home that made her "feel funny." States she is hungry during time of visit. On CLD but has not received a tray yet. Weight stable. Does not need to be followed by R  Body mass index is 39.84 kg/m. Patient meets criteria for obese class II based on current BMI.   Current diet order is Clear Liquids, patient is consuming approximately unknownof meals at this time. Labs and medications reviewed.   No nutrition interventions warranted at this time. If nutrition issues arise, please consult RD.   Lori Anis. Fritz Cauthon, MS, RD LDN Inpatient Clinical Dietitian Pager [phone]

## 2016-04-25 LAB — TYPE AND SCREEN
ABO/RH(D): B NEG
ANTIBODY SCREEN: NEGATIVE
UNIT DIVISION: 0
Unit division: 0

## 2016-04-25 LAB — CBC
HEMATOCRIT: 25.4 % — AB (ref 35.0–47.0)
Hemoglobin: 8.1 g/dL — ABNORMAL LOW (ref 12.0–16.0)
MCH: 21.6 pg — ABNORMAL LOW (ref 26.0–34.0)
MCHC: 31.9 g/dL — ABNORMAL LOW (ref 32.0–36.0)
MCV: 67.6 fL — AB (ref 80.0–100.0)
PLATELETS: 543 10*3/uL — AB (ref 150–440)
RBC: 3.75 MIL/uL — AB (ref 3.80–5.20)
RDW: 25.6 % — ABNORMAL HIGH (ref 11.5–14.5)
WBC: 8.1 10*3/uL (ref 3.6–11.0)

## 2016-04-25 LAB — URINE CULTURE: SPECIAL REQUESTS: NORMAL

## 2016-04-25 LAB — GLUCOSE, CAPILLARY
Glucose-Capillary: 105 mg/dL — ABNORMAL HIGH (ref 65–99)
Glucose-Capillary: 100 mg/dL — ABNORMAL HIGH (ref 65–99)

## 2016-04-25 MED ORDER — PANTOPRAZOLE SODIUM 40 MG PO TBEC: 40.0000 mg | DELAYED_RELEASE_TABLET | Freq: Two times a day (BID) | ORAL | 0 refills | Status: DC

## 2016-04-25 NOTE — Progress Notes (Signed)
   [address] [place], [place] [address]  April 25, 2016  Patient:  Loana Wafer Date of Birth: [birth year] Date of Visit:  04/23/2016  To Whom it May Concern:  Please excuse Deva Avino from work from 04/23/2016 until 04/25/16 as she was admitted to the [facility] for medical treatment and has been receiving appropriate care. She may return to work on 04/26/16, sooner if she feels she is able to return sooner than this date.      Please don't hesitate to contact me with questions or concerns by calling  [phone] and asking them to page me directly.   Regino Schultze, MD

## 2016-04-25 NOTE — Progress Notes (Signed)
Patient discharged home. DC instructions provided and explained. Medications reviewed. Rx given. All questions answered. Pt stable at discharge. 

## 2016-04-25 NOTE — Discharge Summary (Signed)
[facility] [place] NAME: Lori Bentley    MR#:  [id number]  DATE OF BIRTH:  Aug 23, 1985  DATE OF ADMISSION:  04/23/2016 ADMITTING PHYSICIAN: Lance Coon, MD  DATE OF DISCHARGE: 04/25/16  PRIMARY CARE PHYSICIAN: Freddy Finner, NP    ADMISSION DIAGNOSIS:  Gastrointestinal hemorrhage with melena [K92.1] Symptomatic anemia [D64.9]  DISCHARGE DIAGNOSIS:  Principal Problem:   Blood loss anemia Active Problems:   Hematemesis   Melena   Diabetes (HCC)   GERD (gastroesophageal reflux disease)   SECONDARY DIAGNOSIS:   Past Medical History:  Diagnosis Date  . Anxiety   . Asthma   . Chronic anemia   . Depression   . Diabetes ([place])   . GERD (gastroesophageal reflux disease)     HOSPITAL COURSE:  Lori Bentley  is a 30 y.o. female admitted 04/23/2016 with chief complaint Hematemesis and Constipation . Please see H&P performed by Lance Coon, MD for further information. Patient presented after 2 episodes hematemesis. Noted low hemoglobin, underwent blood transfusion on admission. No further episodes of hematemesis or melena. Hgb remained stable  DISCHARGE CONDITIONS:   stable  CONSULTS OBTAINED:    DRUG ALLERGIES:   Allergies  Allergen Reactions  . Percocet [Oxycodone-Acetaminophen] Swelling    DISCHARGE MEDICATIONS:   Current Discharge Medication List    START taking these medications   Details  pantoprazole (PROTONIX) 40 MG tablet Take 1 tablet (40 mg total) by mouth 2 (two) times daily. Qty: 60 tablet, Refills: 0      CONTINUE these medications which have NOT CHANGED   Details  albuterol (PROVENTIL HFA;VENTOLIN HFA) 108 (90 Base) MCG/ACT inhaler Inhale 2 puffs into the lungs every 6 (six) hours as needed for wheezing.    cholecalciferol (VITAMIN D) 1000 units tablet Take 1,000 Units by mouth daily.    escitalopram (LEXAPRO) 20 MG tablet Take 20 mg by mouth daily.    ferrous sulfate 325 (65 FE) MG  tablet Take 325 mg by mouth daily.         DISCHARGE INSTRUCTIONS:  Avoid NSAIDs  DIET:  Regular diet  DISCHARGE CONDITION:  Stable  ACTIVITY:  Activity as tolerated  OXYGEN:  Home Oxygen: No.   Oxygen Delivery: room air  DISCHARGE LOCATION:  home   If you experience worsening of your admission symptoms, develop shortness of breath, life threatening emergency, suicidal or homicidal thoughts you must seek medical attention immediately by calling 911 or calling your MD immediately  if symptoms less severe.  You Must read complete instructions/literature along with all the possible adverse reactions/side effects for all the Medicines you take and that have been prescribed to you. Take any new Medicines after you have completely understood and accpet all the possible adverse reactions/side effects.   Please note  You were cared for by a hospitalist during your hospital stay. If you have any questions about your discharge medications or the care you received while you were in the hospital after you are discharged, you can call the unit and asked to speak with the hospitalist on call if the hospitalist that took care of you is not available. Once you are discharged, your primary care physician will handle any further medical issues. Please note that NO REFILLS for any discharge medications will be authorized once you are discharged, as it is imperative that you return to your primary care physician (or establish a relationship with a primary care physician if you do not have one) for your  aftercare needs so that they can reassess your need for medications and monitor your lab values.    On the day of Discharge:   VITAL SIGNS:  Blood pressure 111/75, pulse 79, temperature 98.3 F (36.8 C), temperature source Oral, resp. rate 18, height 5' (1.524 m), weight 92.5 kg (204 lb), last menstrual period 04/05/2016, SpO2 99 %.  I/O:   Intake/Output Summary (Last 24 hours) at 04/25/16  0937 Last data filed at 04/24/16 1800  Gross per 24 hour  Intake              760 ml  Output                0 ml  Net              760 ml    PHYSICAL EXAMINATION:  GENERAL:  30 y.o.-year-old patient lying in the bed with no acute distress.  EYES: Pupils equal, round, reactive to light and accommodation. No scleral icterus. Extraocular muscles intact.  HEENT: Head atraumatic, normocephalic. Oropharynx and nasopharynx clear.  NECK:  Supple, no jugular venous distention. No thyroid enlargement, no tenderness.  LUNGS: Normal breath sounds bilaterally, no wheezing, rales,rhonchi or crepitation. No use of accessory muscles of respiration.  CARDIOVASCULAR: S1, S2 normal. No murmurs, rubs, or gallops.  ABDOMEN: Soft, non-tender, non-distended. Bowel sounds present. No organomegaly or mass.  EXTREMITIES: No pedal edema, cyanosis, or clubbing.  NEUROLOGIC: Cranial nerves II through XII are intact. Muscle strength 5/5 in all extremities. Sensation intact. Gait not checked.  PSYCHIATRIC: The patient is alert and oriented x 3.  SKIN: No obvious rash, lesion, or ulcer.   DATA REVIEW:   CBC  Recent Labs Lab 04/25/16 0833  WBC 8.1  HGB 8.1*  HCT 25.4*  PLT 543*    Chemistries   Recent Labs Lab 04/23/16 1852 04/24/16 0538  NA 137 139  K 3.2* 3.7  CL 104 106  CO2 27 27  GLUCOSE 127* 99  BUN 8 7  CREATININE 0.72 0.83  CALCIUM 8.7* 8.5*  AST 23  --   ALT 19  --   ALKPHOS 50  --   BILITOT 0.1*  --     Cardiac Enzymes  Recent Labs Lab 04/23/16 1930  TROPONINI <0.03    Microbiology Results  Results for orders placed or performed during the hospital encounter of 04/23/16  Urine culture     Status: Abnormal   Collection Time: 04/23/16  6:52 PM  Result Value Ref Range Status   Specimen Description URINE, RANDOM  Final   Special Requests Normal  Final   Culture MULTIPLE SPECIES PRESENT, SUGGEST RECOLLECTION (A)  Final   Report Status 04/25/2016 FINAL  Final    RADIOLOGY:   No results found.   Management plans discussed with the patient, family and they are in agreement.  CODE STATUS:     Code Status Orders        Start     Ordered   04/23/16 2232  Full code  Continuous     04/23/16 2231    Code Status History    Date Active Date Inactive Code Status Order ID Comments User Context   This patient has a current code status but no historical code status.      TOTAL TIME TAKING CARE OF THIS PATIENT: 33 minutes.    Lori Bentley,  Karenann Cai.D on 04/25/2016 at 9:37 AM  Between 7am to 6pm - Pager - [phone]  After 6pm go to  [email or web address] - Proofreader  [facility]  Office  [phone]  CC: Primary care physician; Freddy Finner, NP

## 2016-04-25 NOTE — Progress Notes (Signed)
   [address] [place], [place] [address]  April 25, 2016  Patient:  Lori Bentley Date of Birth: 12/06/1985 Date of Visit:  04/23/2016  To Whom it May Concern:  Please excuse Felipa Eth from work from 04/23/2016 until 04/25/16 as he was with a loved one admitted to the [facility] for medical treatment     Please don't hesitate to contact me with questions or concerns by calling  [phone] and asking them to page me directly.   Regino Schultze, MD

## 2016-04-27 LAB — PREPARE RBC (CROSSMATCH)

## 2016-05-08 DIAGNOSIS — R0683 Snoring: Secondary | ICD-10-CM | POA: Insufficient documentation

## 2016-05-16 ENCOUNTER — Inpatient Hospital Stay: Payer: Medicaid Other

## 2016-05-16 ENCOUNTER — Inpatient Hospital Stay: Payer: Medicaid Other | Attending: Hematology and Oncology | Admitting: Hematology and Oncology

## 2016-05-16 ENCOUNTER — Encounter: Payer: Self-pay | Admitting: Hematology and Oncology

## 2016-05-16 ENCOUNTER — Other Ambulatory Visit: Payer: Self-pay | Admitting: Hematology and Oncology

## 2016-05-16 VITALS — BP 130/86 | HR 98 | Temp 97.1°F | Resp 18 | Ht 59.65 in | Wt 200.0 lb

## 2016-05-16 DIAGNOSIS — R252 Cramp and spasm: Secondary | ICD-10-CM

## 2016-05-16 DIAGNOSIS — K2100 Gastro-esophageal reflux disease with esophagitis, without bleeding: Secondary | ICD-10-CM

## 2016-05-16 DIAGNOSIS — E860 Dehydration: Secondary | ICD-10-CM | POA: Insufficient documentation

## 2016-05-16 DIAGNOSIS — F329 Major depressive disorder, single episode, unspecified: Secondary | ICD-10-CM | POA: Diagnosis not present

## 2016-05-16 DIAGNOSIS — Z79899 Other long term (current) drug therapy: Secondary | ICD-10-CM

## 2016-05-16 DIAGNOSIS — R531 Weakness: Secondary | ICD-10-CM | POA: Insufficient documentation

## 2016-05-16 DIAGNOSIS — F419 Anxiety disorder, unspecified: Secondary | ICD-10-CM | POA: Diagnosis not present

## 2016-05-16 DIAGNOSIS — R112 Nausea with vomiting, unspecified: Secondary | ICD-10-CM

## 2016-05-16 DIAGNOSIS — F5089 Other specified eating disorder: Secondary | ICD-10-CM | POA: Diagnosis not present

## 2016-05-16 DIAGNOSIS — E119 Type 2 diabetes mellitus without complications: Secondary | ICD-10-CM | POA: Diagnosis not present

## 2016-05-16 DIAGNOSIS — R5383 Other fatigue: Secondary | ICD-10-CM

## 2016-05-16 DIAGNOSIS — N920 Excessive and frequent menstruation with regular cycle: Secondary | ICD-10-CM

## 2016-05-16 DIAGNOSIS — D500 Iron deficiency anemia secondary to blood loss (chronic): Secondary | ICD-10-CM

## 2016-05-16 DIAGNOSIS — K219 Gastro-esophageal reflux disease without esophagitis: Secondary | ICD-10-CM | POA: Insufficient documentation

## 2016-05-16 DIAGNOSIS — R51 Headache: Secondary | ICD-10-CM

## 2016-05-16 DIAGNOSIS — R55 Syncope and collapse: Secondary | ICD-10-CM

## 2016-05-16 DIAGNOSIS — K921 Melena: Secondary | ICD-10-CM | POA: Diagnosis not present

## 2016-05-16 DIAGNOSIS — J45909 Unspecified asthma, uncomplicated: Secondary | ICD-10-CM

## 2016-05-16 DIAGNOSIS — R519 Headache, unspecified: Secondary | ICD-10-CM | POA: Insufficient documentation

## 2016-05-16 DIAGNOSIS — D509 Iron deficiency anemia, unspecified: Secondary | ICD-10-CM | POA: Insufficient documentation

## 2016-05-16 LAB — CBC WITH DIFFERENTIAL/PLATELET
Basophils Absolute: 0.1 10*3/uL (ref 0–0.1)
Basophils Relative: 1 %
EOS ABS: 0.6 10*3/uL (ref 0–0.7)
EOS PCT: 7 %
HCT: 28.4 % — ABNORMAL LOW (ref 35.0–47.0)
Hemoglobin: 8.5 g/dL — ABNORMAL LOW (ref 12.0–16.0)
LYMPHS ABS: 2.4 10*3/uL (ref 1.0–3.6)
LYMPHS PCT: 30 %
MCH: 20.3 pg — AB (ref 26.0–34.0)
MCHC: 29.9 g/dL — AB (ref 32.0–36.0)
MCV: 67.9 fL — AB (ref 80.0–100.0)
MONO ABS: 0.3 10*3/uL (ref 0.2–0.9)
MONOS PCT: 4 %
Neutro Abs: 4.6 10*3/uL (ref 1.4–6.5)
Neutrophils Relative %: 58 %
PLATELETS: 436 10*3/uL (ref 150–440)
RBC: 4.19 MIL/uL (ref 3.80–5.20)
RDW: 27.3 % — AB (ref 11.5–14.5)
WBC: 8 10*3/uL (ref 3.6–11.0)

## 2016-05-16 LAB — FERRITIN: Ferritin: 8 ng/mL — ABNORMAL LOW (ref 11–307)

## 2016-05-16 LAB — RETICULOCYTES
RBC.: 4.19 MIL/uL (ref 3.80–5.20)
RETIC CT PCT: 3.7 % — AB (ref 0.4–3.1)
Retic Count, Absolute: 155 10*3/uL (ref 19.0–183.0)

## 2016-05-16 MED ORDER — SODIUM CHLORIDE 0.9% FLUSH
3.0000 mL | INTRAVENOUS | Status: DC | PRN
  Filled 2016-05-16: qty 3

## 2016-05-16 MED ORDER — HEPARIN SOD (PORK) LOCK FLUSH 100 UNIT/ML IV SOLN: 250.0000 [IU] | INTRAVENOUS | Status: DC | PRN

## 2016-05-16 MED ORDER — ACETAMINOPHEN 500 MG PO TABS
500.0000 mg | ORAL_TABLET | Freq: Four times a day (QID) | ORAL | Status: AC | PRN
  Administered 2016-05-16: 500 mg via ORAL
  Filled 2016-05-16: qty 1

## 2016-05-16 MED ORDER — SODIUM CHLORIDE 0.9% FLUSH
10.0000 mL | INTRAVENOUS | Status: DC | PRN
  Filled 2016-05-16: qty 10

## 2016-05-16 MED ORDER — SODIUM CHLORIDE 0.9 % IV SOLN
250.0000 mL | Freq: Once | INTRAVENOUS | Status: DC
  Filled 2016-05-16: qty 250

## 2016-05-16 MED ORDER — HEPARIN SOD (PORK) LOCK FLUSH 100 UNIT/ML IV SOLN: 500.0000 [IU] | Freq: Every day | INTRAVENOUS | Status: DC | PRN

## 2016-05-16 NOTE — Assessment & Plan Note (Signed)
The patient is dehydrated with nausea and vomiting at the end of visit. We will give her some IV fluid support

## 2016-05-16 NOTE — Assessment & Plan Note (Signed)
She has some severe nausea with vomiting likely secondary to headache and dehydration. Would give her IV fluid support and IV antiemetics

## 2016-05-16 NOTE — Progress Notes (Signed)
Patient has history of anemia with blood transfusion.  She is scheduled for EGD Friday and they would like her to have hematology eval before procedure.  She does complain of headache today and they are becoming more frequent.

## 2016-05-16 NOTE — Assessment & Plan Note (Signed)
I think this is the most likely cause of her severe iron deficiency anemia I recommend consideration of using birth control pill to stop menstrual cycles in the future I would defer to her PCP for management

## 2016-05-16 NOTE — Assessment & Plan Note (Signed)
She has severe headaches likely due to severe anemia. As above, we will give her blood transfusion. I will also give her some Tylenol with soda today to see we can get the headaches treated

## 2016-05-16 NOTE — Progress Notes (Addendum)
[facility] NOTE  Patient Care Team: Freddy Finner, NP as PCP - General (Nurse Practitioner)  CHIEF COMPLAINTS/PURPOSE OF CONSULTATION:  Severe iron deficiency anemia  HISTORY OF PRESENTING ILLNESS:  Lori Bentley 30 y.o. female is here because of recent melena and severe iron deficiency anemia. She was recently hospitalized from 04/23/16 to 04/25/16 due to severe iron deficiency anemia with melena and hematemesis Her hemoglobin was low at 5.2 and she received 2 units of blood transfusion. The patient presented with hematemesis. She was started on IV Protonix, transfuse and subsequently discharged EGD is scheduled on 05/18/2016 as an outpatient Today, she is in my office crying with severe headache. She was seen by her primary care doctor recently and she told me her most recent hemoglobin count was 7.3. Her headache is rated 8 out of 10. She has frequent heartburn and reflux prior to admission due to excessive NSAID ingestion for severe headaches. She has not taken any recent NSAID She is taking regular PPI as prescribed  She complains of excessive fatigue, shortness of breath on minimal exertion, pre-syncopal episodes, which leg cramps with frequent palpitations. She had not noticed any recent bleeding such as epistaxis, hematuria or hematochezia She has 4 children. She was told she was anemic before but was never transfused until recently. She have excessive menorrhagia with regular cycle.  She had no prior history or diagnosis of cancer. Her age appropriate screening programs are up-to-date. She has pica with excessive chewing of ice but eats a variety of diet. She never donated blood  The patient was prescribed oral iron supplements but she was not able to take it because it made her sick with constipation and severe nausea She has family history of anemia She is not able to work due to significant debility with severe headaches  MEDICAL HISTORY:  Past  Medical History:  Diagnosis Date  . Anxiety   . Asthma   . Chronic anemia   . Depression   . Diabetes ([place])   . GERD (gastroesophageal reflux disease)     SURGICAL HISTORY: Past Surgical History:  Procedure Laterality Date  . TUBAL LIGATION      SOCIAL HISTORY: Social History   Social History  . Marital status: Single    Spouse name: N/A  . Number of children: N/A  . Years of education: N/A   Occupational History  . Not on file.   Social History Main Topics  . Smoking status: Never Smoker  . Smokeless tobacco: Never Used  . Alcohol use No  . Drug use: No  . Sexual activity: Not on file   Other Topics Concern  . Not on file   Social History Narrative  . No narrative on file    FAMILY HISTORY: Family History  Problem Relation Age of Onset  . Anxiety disorder Mother   . Asthma Mother   . GER disease Father     ALLERGIES:  is allergic to percocet [oxycodone-acetaminophen].  MEDICATIONS:  Current Outpatient Prescriptions  Medication Sig Dispense Refill  . albuterol (PROVENTIL HFA;VENTOLIN HFA) 108 (90 Base) MCG/ACT inhaler Inhale 2 puffs into the lungs every 6 (six) hours as needed for wheezing.    . cholecalciferol (VITAMIN D) 1000 units tablet Take 1,000 Units by mouth daily.    Marland Kitchen escitalopram (LEXAPRO) 20 MG tablet Take 20 mg by mouth daily.    . ferrous sulfate 325 (65 FE) MG tablet Take 325 mg by mouth daily.    . pantoprazole (PROTONIX) 40  MG tablet Take 1 tablet (40 mg total) by mouth 2 (two) times daily. 60 tablet 0   Current Facility-Administered Medications  Medication Dose Route Frequency Provider Last Rate Last Dose  . 0.9 %  sodium chloride infusion  250 mL Intravenous Once Heath Lark, MD      . heparin lock flush 100 unit/mL  500 Units Intracatheter Daily PRN Heath Lark, MD      . heparin lock flush 100 unit/mL  250 Units Intracatheter PRN Heath Lark, MD      . sodium chloride flush (NS) 0.9 % injection 10 mL  10 mL Intracatheter PRN Heath Lark, MD      . sodium chloride flush (NS) 0.9 % injection 3 mL  3 mL Intracatheter PRN Heath Lark, MD        REVIEW OF SYSTEMS:   Constitutional: Denies fevers, chills or abnormal night sweats Eyes: Denies blurriness of vision, double vision or watery eyes Ears, nose, mouth, throat, and face: Denies mucositis or sore throat Gastrointestinal:  Denies nausea, heartburn or change in bowel habits Skin: Denies abnormal skin rashes Lymphatics: Denies new lymphadenopathy or easy bruising Neurological:Denies numbness, tingling or new weaknesses Behavioral/Psych: Mood is stable, no new changes  All other systems were reviewed with the patient and are negative.  PHYSICAL EXAMINATION: ECOG PERFORMANCE STATUS: 1 - Symptomatic but completely ambulatory  Vitals:   05/16/16 1329  BP: 130/86  Pulse: 98  Resp: 18  Temp: 97.1 F (36.2 C)   Filed Weights   05/16/16 1329  Weight: 199 lb 15.3 oz (90.7 kg)    GENERAL:alert, In moderate distress from severe headaches SKIN: skin color is pale, texture, turgor are normal, no rashes or significant lesions EYES: normal, conjunctiva are pale and non-injected, sclera clear OROPHARYNX:no exudate, no erythema and lips, buccal mucosa, and tongue normal  NECK: supple, thyroid normal size, non-tender, without nodularity LYMPH:  no palpable lymphadenopathy in the cervical, axillary or inguinal LUNGS: clear to auscultation and percussion with normal breathing effort HEART: regular rate & rhythm and no murmurs and no lower extremity edema ABDOMEN:abdomen soft, non-tender and normal bowel sounds Musculoskeletal:no cyanosis of digits and no clubbing  PSYCH: alert & oriented x 3 with fluent speech NEURO: no focal motor/sensory deficits   ASSESSMENT & PLAN:  IDA (iron deficiency anemia) The most likely cause of her severe iron deficiency anemia is likely due to severe menorrhagia.  That in turn caused significant headaches and she has taken excessive  amount of NSAID. Her recent hospitalization was attributed to GI bleed She was treated appropriately with blood transfusion and PPI However, she have significant symptomatic anemia in my office We discussed some of the risks, benefits, and alternatives of blood transfusions. The patient is symptomatic from anemia and the hemoglobin level is critically low.  Some of the side-effects to be expected including risks of transfusion reactions, chills, infection, syndrome of volume overload and risk of hospitalization from various reasons and the patient is willing to proceed and went ahead to sign consent today. I would like to give HER-2 units of blood today at tomorrow along with intravenous iron next week The most likely cause of her anemia is due to chronic blood loss/malabsorption syndrome. We discussed some of the risks, benefits, and alternatives of intravenous iron infusions. The patient is symptomatic from anemia and the iron level is critically low. She tolerated oral iron supplement poorly and desires to achieved higher levels of iron faster for adequate hematopoesis. Some of the side-effects  to be expected including risks of infusion reactions, phlebitis, headaches, nausea and fatigue.  The patient is willing to proceed. Patient education material was dispensed.  Goal is to keep ferritin level greater than 50 I will bring her back in 3 months to repeat blood work and further intravenous iron if needed  New onset of headaches She has severe headaches likely due to severe anemia. As above, we will give her blood transfusion. I will also give her some Tylenol with soda today to see we can get the headaches treated  Melena This was related to recent upper GI bleed. Endoscopy evaluation is pending. She will continue PPI  Menorrhagia with regular cycle I think this is the most likely cause of her severe iron deficiency anemia I recommend consideration of using birth control pill to stop  menstrual cycles in the future I would defer to her PCP for management  Dehydration The patient is dehydrated with nausea and vomiting at the end of visit. We will give her some IV fluid support  Nausea with vomiting She has some severe nausea with vomiting likely secondary to headache and dehydration. Would give her IV fluid support and IV antiemetics  Addendum at 2:51 PM: Stat labs came back hemoglobin 8.5. Blood transfusion will be canceled as the patient does not need blood transfusion but I will give her IV fluids and IV antiemetics instead for nausea, vomiting and dehydration  All questions were answered. The patient knows to call the clinic with any problems, questions or concerns. I spent 40 minutes counseling the patient face to face. The total time spent in the appointment was 60 minutes and more than 50% was on counseling.     Heath Lark, MD 05/16/16 2:51 PM

## 2016-05-16 NOTE — Assessment & Plan Note (Signed)
This was related to recent upper GI bleed. Endoscopy evaluation is pending. She will continue PPI

## 2016-05-16 NOTE — Assessment & Plan Note (Addendum)
The most likely cause of her severe iron deficiency anemia is likely due to severe menorrhagia.  That in turn caused significant headaches and she has taken excessive amount of NSAID. Her recent hospitalization was attributed to GI bleed She was treated appropriately with blood transfusion and PPI However, she have significant symptomatic anemia in my office We discussed some of the risks, benefits, and alternatives of blood transfusions. The patient is symptomatic from anemia and the hemoglobin level is critically low.  Some of the side-effects to be expected including risks of transfusion reactions, chills, infection, syndrome of volume overload and risk of hospitalization from various reasons and the patient is willing to proceed and went ahead to sign consent today. I would like to give HER-2 units of blood today at tomorrow along with intravenous iron next week The most likely cause of her anemia is due to chronic blood loss/malabsorption syndrome. We discussed some of the risks, benefits, and alternatives of intravenous iron infusions. The patient is symptomatic from anemia and the iron level is critically low. She tolerated oral iron supplement poorly and desires to achieved higher levels of iron faster for adequate hematopoesis. Some of the side-effects to be expected including risks of infusion reactions, phlebitis, headaches, nausea and fatigue.  The patient is willing to proceed. Patient education material was dispensed.  Goal is to keep ferritin level greater than 50 I will bring her back in 3 months to repeat blood work and further intravenous iron if needed

## 2016-05-17 ENCOUNTER — Encounter: Payer: Self-pay | Admitting: *Deleted

## 2016-05-17 ENCOUNTER — Ambulatory Visit: Payer: Medicaid Other

## 2016-05-18 ENCOUNTER — Ambulatory Visit: Payer: Medicaid Other | Admitting: Anesthesiology

## 2016-05-18 ENCOUNTER — Encounter: Payer: Self-pay | Admitting: *Deleted

## 2016-05-18 ENCOUNTER — Ambulatory Visit
Admission: RE | Admit: 2016-05-18 | Discharge: 2016-05-18 | Disposition: A | Discharge status: Home / Self Care | Payer: Medicaid Other | Source: Ambulatory Visit | Attending: Unknown Physician Specialty | Admitting: Unknown Physician Specialty

## 2016-05-18 ENCOUNTER — Encounter
Admission: RE | Disposition: A | Discharge status: Home / Self Care | Payer: Self-pay | Source: Ambulatory Visit | Attending: Unknown Physician Specialty

## 2016-05-18 DIAGNOSIS — F329 Major depressive disorder, single episode, unspecified: Secondary | ICD-10-CM | POA: Insufficient documentation

## 2016-05-18 DIAGNOSIS — E860 Dehydration: Secondary | ICD-10-CM

## 2016-05-18 DIAGNOSIS — J45909 Unspecified asthma, uncomplicated: Secondary | ICD-10-CM | POA: Diagnosis not present

## 2016-05-18 DIAGNOSIS — Z6841 Body Mass Index (BMI) 40.0 and over, adult: Secondary | ICD-10-CM | POA: Insufficient documentation

## 2016-05-18 DIAGNOSIS — D500 Iron deficiency anemia secondary to blood loss (chronic): Secondary | ICD-10-CM | POA: Diagnosis present

## 2016-05-18 DIAGNOSIS — F17200 Nicotine dependence, unspecified, uncomplicated: Secondary | ICD-10-CM | POA: Insufficient documentation

## 2016-05-18 DIAGNOSIS — E119 Type 2 diabetes mellitus without complications: Secondary | ICD-10-CM | POA: Diagnosis not present

## 2016-05-18 DIAGNOSIS — K449 Diaphragmatic hernia without obstruction or gangrene: Secondary | ICD-10-CM | POA: Insufficient documentation

## 2016-05-18 DIAGNOSIS — Z79899 Other long term (current) drug therapy: Secondary | ICD-10-CM | POA: Diagnosis not present

## 2016-05-18 DIAGNOSIS — R112 Nausea with vomiting, unspecified: Secondary | ICD-10-CM

## 2016-05-18 DIAGNOSIS — K219 Gastro-esophageal reflux disease without esophagitis: Secondary | ICD-10-CM | POA: Insufficient documentation

## 2016-05-18 DIAGNOSIS — F419 Anxiety disorder, unspecified: Secondary | ICD-10-CM | POA: Diagnosis not present

## 2016-05-18 DIAGNOSIS — K2960 Other gastritis without bleeding: Secondary | ICD-10-CM | POA: Insufficient documentation

## 2016-05-18 HISTORY — PX: ESOPHAGOGASTRODUODENOSCOPY (EGD) WITH PROPOFOL: SHX5813

## 2016-05-18 LAB — POCT PREGNANCY, URINE: PREG TEST UR: NEGATIVE

## 2016-05-18 SURGERY — ESOPHAGOGASTRODUODENOSCOPY (EGD) WITH PROPOFOL
Anesthesia: General

## 2016-05-18 MED ORDER — PROPOFOL 500 MG/50ML IV EMUL
INTRAVENOUS | Status: DC | PRN
  Administered 2016-05-18: 125 ug/kg/min via INTRAVENOUS

## 2016-05-18 MED ORDER — PROPOFOL 10 MG/ML IV BOLUS
INTRAVENOUS | Status: DC | PRN
  Administered 2016-05-18: 100 mg via INTRAVENOUS

## 2016-05-18 MED ORDER — ONDANSETRON HCL 4 MG/2ML IJ SOLN
INTRAMUSCULAR | Status: AC
  Administered 2016-05-18: 4 mg
  Filled 2016-05-18: qty 2

## 2016-05-18 MED ORDER — GLYCOPYRROLATE 0.2 MG/ML IJ SOLN
INTRAMUSCULAR | Status: DC | PRN
  Administered 2016-05-18: 0.2 mg via INTRAVENOUS

## 2016-05-18 MED ORDER — SODIUM CHLORIDE 0.9 % IV SOLN: INTRAVENOUS | Status: DC

## 2016-05-18 MED ORDER — LACTATED RINGERS IV SOLN
INTRAVENOUS | Status: DC | PRN
  Administered 2016-05-18: 09:00:00 via INTRAVENOUS

## 2016-05-18 MED ORDER — IPRATROPIUM-ALBUTEROL 0.5-2.5 (3) MG/3ML IN SOLN
RESPIRATORY_TRACT | Status: AC
  Administered 2016-05-18: 3 mL via RESPIRATORY_TRACT
  Filled 2016-05-18: qty 3

## 2016-05-18 MED ORDER — IPRATROPIUM-ALBUTEROL 0.5-2.5 (3) MG/3ML IN SOLN
3.0000 mL | Freq: Once | RESPIRATORY_TRACT | Status: AC
  Administered 2016-05-18: 3 mL via RESPIRATORY_TRACT

## 2016-05-18 MED ORDER — SODIUM CHLORIDE 0.9 % IV SOLN
Freq: Once | INTRAVENOUS | Status: AC
  Administered 2016-05-18: 09:00:00 via INTRAVENOUS

## 2016-05-18 MED ORDER — SODIUM CHLORIDE 0.9 % IV SOLN
Freq: Once | INTRAVENOUS | Status: DC
  Filled 2016-05-18: qty 4

## 2016-05-18 NOTE — H&P (Signed)
Primary Care Physician:  Freddy Finner, NP Primary Gastroenterologist:  Dr. Vira Agar  Pre-Procedure History & Physical: HPI:  Lori Bentley is a 30 y.o. female is here for an endoscopy.  She has hx of melena and anemia and UGI bleed   Past Medical History:  Diagnosis Date  . Anxiety   . Asthma   . Chronic anemia   . Depression   . Diabetes ([place])   . GERD (gastroesophageal reflux disease)     Past Surgical History:  Procedure Laterality Date  . TUBAL LIGATION      Prior to Admission medications   Medication Sig Start Date End Date Taking? Authorizing Provider  albuterol (PROVENTIL HFA;VENTOLIN HFA) 108 (90 Base) MCG/ACT inhaler Inhale 2 puffs into the lungs every 6 (six) hours as needed for wheezing. 09/01/14  Yes Historical Provider, MD  cholecalciferol (VITAMIN D) 1000 units tablet Take 1,000 Units by mouth daily.   Yes Historical Provider, MD  docusate sodium (COLACE) 100 MG capsule Take 100 mg by mouth daily.   Yes Historical Provider, MD  escitalopram (LEXAPRO) 20 MG tablet Take 20 mg by mouth daily. 12/10/14  Yes Historical Provider, MD  ferrous sulfate 325 (65 FE) MG tablet Take 325 mg by mouth daily.   Yes Historical Provider, MD  levocetirizine (XYZAL) 5 MG tablet Take 5 mg by mouth every evening.   Yes Historical Provider, MD  montelukast (SINGULAIR) 10 MG tablet Take 10 mg by mouth at bedtime.   Yes Historical Provider, MD  pantoprazole (PROTONIX) 40 MG tablet Take 1 tablet (40 mg total) by mouth 2 (two) times daily. 04/25/16 05/25/16 Yes Lytle Butte, MD    Allergies as of 05/16/2016 - Review Complete 05/16/2016  Allergen Reaction Noted  . Percocet [oxycodone-acetaminophen] Swelling 01/15/2015    Family History  Problem Relation Age of Onset  . Anxiety disorder Mother   . Asthma Mother   . GER disease Father     Social History   Social History  . Marital status: Single    Spouse name: N/A  . Number of children: N/A  . Years of education: N/A    Occupational History  . Not on file.   Social History Main Topics  . Smoking status: Current Some Day Smoker  . Smokeless tobacco: Never Used  . Alcohol use Yes  . Drug use: No  . Sexual activity: Not on file   Other Topics Concern  . Not on file   Social History Narrative  . No narrative on file    Review of Systems: See HPI, otherwise negative ROS  Physical Exam: BP 112/78   Pulse 95   Temp (!) 96.3 F (35.7 C) (Tympanic)   Resp 17   Ht 4\' 11"  (1.499 m)   Wt 90.3 kg (199 lb)   LMP 04/05/2016 Comment: urine preg negative  SpO2 100%   BMI 40.19 kg/m  General:   Alert,  pleasant and cooperative in NAD Head:  Normocephalic and atraumatic. Neck:  Supple; no masses or thyromegaly. Lungs:  Clear throughout to auscultation.    Heart:  Regular rate and rhythm. Abdomen:  Soft, nontender and nondistended. Normal bowel sounds, without guarding, and without rebound.   Neurologic:  Alert and  oriented x4;  grossly normal neurologically.  Impression/Plan: Lori Bentley is here for an endoscopy to be performed for EGD due to hx of melena and anemia  Risks, benefits, limitations, and alternatives regarding  endoscopy have been reviewed with the patient.  Questions have been  answered.  All parties agreeable.   Gaylyn Cheers, MD  05/18/2016, 10:20 AM

## 2016-05-18 NOTE — Op Note (Signed)
[facility] Gastroenterology Patient Name: Lori Bentley Procedure Date: 05/18/2016 8:58 AM MRN: [id number] Account #: [id number] Date of Birth: 1985/10/02 Admit Type: Outpatient Age: 30 Room: [facility] ENDO ROOM 1 Gender: Female Note Status: Finalized Procedure:            Upper GI endoscopy Indications:          Iron deficiency anemia secondary to chronic blood loss,                        Nausea with vomiting Providers:            Manya Silvas, MD Referring MD:         Neomia Dear (Referring MD) Medicines:            Propofol per Anesthesia Complications:        No immediate complications. Procedure:            Pre-Anesthesia Assessment:                       - After reviewing the risks and benefits, the patient                        was deemed in satisfactory condition to undergo the                        procedure.                       After obtaining informed consent, the endoscope was                        passed under direct vision. Throughout the procedure,                        the patient's blood pressure, pulse, and oxygen                        saturations were monitored continuously. The Endoscope                        was introduced through the mouth, and advanced to the                        second part of duodenum. The upper GI endoscopy was                        accomplished without difficulty. The patient tolerated                        the procedure well. Findings:      Localized mild mucosal changes characterized by rings were found in the       lower third of the esophagus. Biopsies were taken with a cold forceps       for histology. GEJ 31cm. Hiatal hernia was about 5-6cm in length.      A large hiatal hernia was present.      Patchy mildly erythematous mucosa without bleeding was found in the       gastric body. Biopsies were taken with a cold forceps for histology. 2       areas were biopsied.  Diffuse atrophic  mucosa was found in the second portion of the duodenum.       Biopsies were taken with a cold forceps for histology. Biopsies for       histology were taken with a cold forceps for evaluation of celiac       disease. Impression:           - Rings mucosa in the esophagus. Biopsied.                       - Large hiatal hernia.                       - Erythematous mucosa in the gastric body. Biopsied.                       - Duodenal mucosal atrophy. Biopsied. Recommendation:       - Await pathology results. Take medicine for gastritis,                        do not eat and lay down. Manya Silvas, MD 05/18/2016 9:21:23 AM This report has been signed electronically. Number of Addenda: 0 Note Initiated On: 05/18/2016 8:58 AM      [facility]

## 2016-05-18 NOTE — Anesthesia Postprocedure Evaluation (Signed)
Anesthesia Post Note  Patient: Kween Faulkner  Procedure(s) Performed: Procedure(s) (LRB): ESOPHAGOGASTRODUODENOSCOPY (EGD) WITH PROPOFOL (N/A)  Patient location during evaluation: Endoscopy Anesthesia Type: General Level of consciousness: awake and alert Pain management: pain level controlled Vital Signs Assessment: post-procedure vital signs reviewed and stable Respiratory status: spontaneous breathing, nonlabored ventilation, respiratory function stable and patient connected to nasal cannula oxygen Cardiovascular status: blood pressure returned to baseline and stable Postop Assessment: no signs of nausea or vomiting Anesthetic complications: no    Last Vitals:  Vitals:   05/18/16 0930 05/18/16 0940  BP: 103/63 112/78  Pulse:    Resp:    Temp:      Last Pain:  Vitals:   05/18/16 0924  TempSrc: Tympanic                 Precious Haws Theron [place]umb[place]

## 2016-05-18 NOTE — Transfer of Care (Signed)
Immediate Anesthesia Transfer of Care Note  Patient: Lori Bentley  Procedure(s) Performed: Procedure(s): ESOPHAGOGASTRODUODENOSCOPY (EGD) WITH PROPOFOL (N/A)  Patient Location: PACU  Anesthesia Type:General  Level of Consciousness: awake, alert  and oriented  Airway & Oxygen Therapy: Patient Spontanous Breathing and Patient connected to nasal cannula oxygen  Post-op Assessment: Report given to RN and Post -op Vital signs reviewed and stable  Post vital signs: Reviewed and stable  Last Vitals:  Vitals:   05/18/16 0828 05/18/16 0924  BP: 113/82 103/67  Pulse: 76 95  Resp: 20 17  Temp: 36.7 C (!) 35.7 C    Last Pain:  Vitals:   05/18/16 0924  TempSrc: Tympanic         Complications: No apparent anesthesia complications

## 2016-05-18 NOTE — Anesthesia Preprocedure Evaluation (Signed)
Anesthesia Evaluation  Patient identified by MRN, date of birth, ID band Patient awake    Reviewed: Allergy & Precautions, H&P , NPO status , Patient's Chart, lab work & pertinent test results  History of Anesthesia Complications Negative for: history of anesthetic complications  Airway Mallampati: I  TM Distance: >3 FB Neck ROM: full    Dental  (+) Poor Dentition, Chipped   Pulmonary neg shortness of breath, asthma , Current Smoker,    Pulmonary exam normal breath sounds clear to auscultation       Cardiovascular Exercise Tolerance: Good (-) angina(-) Past MI and (-) DOE negative cardio ROS Normal cardiovascular exam Rhythm:regular Rate:Normal     Neuro/Psych  Headaches, PSYCHIATRIC DISORDERS Anxiety Depression    GI/Hepatic Neg liver ROS, GERD  Controlled,  Endo/Other  diabetes, Type 2Morbid obesity  Renal/GU negative Renal ROS  negative genitourinary   Musculoskeletal   Abdominal   Peds  Hematology negative hematology ROS (+)   Anesthesia Other Findings Past Medical History: No date: Anxiety No date: Asthma No date: Chronic anemia No date: Depression No date: Diabetes (HCC) No date: GERD (gastroesophageal reflux disease)  Past Surgical History: No date: TUBAL LIGATION  BMI    Body Mass Index:  40.19 kg/m      Reproductive/Obstetrics negative OB ROS                            Anesthesia Physical Anesthesia Plan  ASA: III  Anesthesia Plan: General   Post-op Pain Management:    Induction:   Airway Management Planned:   Additional Equipment:   Intra-op Plan:   Post-operative Plan:   Informed Consent: I have reviewed the patients History and Physical, chart, labs and discussed the procedure including the risks, benefits and alternatives for the proposed anesthesia with the patient or authorized representative who has indicated his/her understanding and acceptance.    Dental Advisory Given  Plan Discussed with: Anesthesiologist, CRNA and Surgeon  Anesthesia Plan Comments:         Anesthesia Quick Evaluation

## 2016-05-19 ENCOUNTER — Encounter: Payer: Self-pay | Admitting: Unknown Physician Specialty

## 2016-05-21 LAB — SURGICAL PATHOLOGY

## 2016-05-24 ENCOUNTER — Inpatient Hospital Stay: Payer: Medicaid Other

## 2016-05-24 VITALS — BP 109/77 | HR 75 | Temp 99.0°F | Resp 18

## 2016-05-24 DIAGNOSIS — D500 Iron deficiency anemia secondary to blood loss (chronic): Secondary | ICD-10-CM | POA: Diagnosis not present

## 2016-05-24 MED ORDER — SODIUM CHLORIDE 0.9 % IV SOLN
510.0000 mg | Freq: Once | INTRAVENOUS | Status: AC
  Administered 2016-05-24: 510 mg via INTRAVENOUS
  Filled 2016-05-24: qty 17

## 2016-05-24 MED ORDER — SODIUM CHLORIDE 0.9 % IV SOLN
Freq: Once | INTRAVENOUS | Status: AC
  Administered 2016-05-24: 14:00:00 via INTRAVENOUS
  Filled 2016-05-24: qty 1000

## 2016-05-30 ENCOUNTER — Inpatient Hospital Stay: Payer: Medicaid Other | Attending: Internal Medicine

## 2016-05-30 DIAGNOSIS — D509 Iron deficiency anemia, unspecified: Secondary | ICD-10-CM | POA: Insufficient documentation

## 2016-05-30 DIAGNOSIS — Z79899 Other long term (current) drug therapy: Secondary | ICD-10-CM | POA: Diagnosis not present

## 2016-05-30 DIAGNOSIS — D500 Iron deficiency anemia secondary to blood loss (chronic): Secondary | ICD-10-CM

## 2016-05-30 MED ORDER — FERUMOXYTOL INJECTION 510 MG/17 ML
510.0000 mg | Freq: Once | INTRAVENOUS | Status: AC
  Administered 2016-05-30: 510 mg via INTRAVENOUS
  Filled 2016-05-30: qty 17

## 2016-05-30 MED ORDER — SODIUM CHLORIDE 0.9 % IV SOLN
Freq: Once | INTRAVENOUS | Status: AC
  Administered 2016-05-30: 15:00:00 via INTRAVENOUS
  Filled 2016-05-30: qty 1000

## 2016-05-31 ENCOUNTER — Inpatient Hospital Stay: Payer: Medicaid Other

## 2016-07-30 DIAGNOSIS — Z9289 Personal history of other medical treatment: Secondary | ICD-10-CM

## 2016-07-30 HISTORY — DX: Personal history of other medical treatment: Z92.89

## 2016-08-22 ENCOUNTER — Other Ambulatory Visit: Payer: Medicaid Other

## 2016-08-22 ENCOUNTER — Ambulatory Visit: Payer: Medicaid Other | Admitting: Hematology and Oncology

## 2016-08-23 ENCOUNTER — Other Ambulatory Visit: Payer: Self-pay | Admitting: *Deleted

## 2016-08-23 DIAGNOSIS — D500 Iron deficiency anemia secondary to blood loss (chronic): Secondary | ICD-10-CM

## 2016-08-24 ENCOUNTER — Inpatient Hospital Stay: Payer: Medicaid Other

## 2016-08-24 ENCOUNTER — Inpatient Hospital Stay: Payer: Medicaid Other | Admitting: Oncology

## 2016-09-07 ENCOUNTER — Inpatient Hospital Stay: Payer: Self-pay

## 2016-09-07 ENCOUNTER — Inpatient Hospital Stay: Payer: Self-pay | Admitting: Oncology

## 2016-09-21 ENCOUNTER — Inpatient Hospital Stay: Payer: Medicaid Other

## 2016-09-21 ENCOUNTER — Inpatient Hospital Stay: Payer: Medicaid Other | Attending: Oncology | Admitting: Oncology

## 2016-09-21 VITALS — BP 120/84 | HR 67 | Temp 97.3°F | Resp 18 | Wt 200.1 lb

## 2016-09-21 DIAGNOSIS — Z7952 Long term (current) use of systemic steroids: Secondary | ICD-10-CM | POA: Diagnosis not present

## 2016-09-21 DIAGNOSIS — Z79899 Other long term (current) drug therapy: Secondary | ICD-10-CM | POA: Diagnosis not present

## 2016-09-21 DIAGNOSIS — K219 Gastro-esophageal reflux disease without esophagitis: Secondary | ICD-10-CM | POA: Diagnosis not present

## 2016-09-21 DIAGNOSIS — F419 Anxiety disorder, unspecified: Secondary | ICD-10-CM | POA: Diagnosis not present

## 2016-09-21 DIAGNOSIS — D500 Iron deficiency anemia secondary to blood loss (chronic): Secondary | ICD-10-CM | POA: Insufficient documentation

## 2016-09-21 DIAGNOSIS — N920 Excessive and frequent menstruation with regular cycle: Secondary | ICD-10-CM

## 2016-09-21 DIAGNOSIS — J45909 Unspecified asthma, uncomplicated: Secondary | ICD-10-CM | POA: Insufficient documentation

## 2016-09-21 DIAGNOSIS — F17210 Nicotine dependence, cigarettes, uncomplicated: Secondary | ICD-10-CM | POA: Diagnosis not present

## 2016-09-21 DIAGNOSIS — E119 Type 2 diabetes mellitus without complications: Secondary | ICD-10-CM | POA: Insufficient documentation

## 2016-09-21 DIAGNOSIS — F329 Major depressive disorder, single episode, unspecified: Secondary | ICD-10-CM | POA: Diagnosis not present

## 2016-09-21 LAB — CBC
HCT: 36.2 % (ref 35.0–47.0)
Hemoglobin: 12 g/dL (ref 12.0–16.0)
MCH: 27 pg (ref 26.0–34.0)
MCHC: 33.1 g/dL (ref 32.0–36.0)
MCV: 81.7 fL (ref 80.0–100.0)
Platelets: 409 10*3/uL (ref 150–440)
RBC: 4.43 MIL/uL (ref 3.80–5.20)
RDW: 15.3 % — ABNORMAL HIGH (ref 11.5–14.5)
WBC: 7.4 10*3/uL (ref 3.6–11.0)

## 2016-09-21 LAB — IRON AND TIBC
IRON: 40 ug/dL (ref 28–170)
SATURATION RATIOS: 12 % (ref 10.4–31.8)
TIBC: 325 ug/dL (ref 250–450)
UIBC: 285 ug/dL

## 2016-09-21 LAB — FERRITIN: FERRITIN: 9 ng/mL — AB (ref 11–307)

## 2016-09-21 NOTE — Progress Notes (Signed)
Here for follow up and doing well per pt

## 2016-09-21 NOTE — Progress Notes (Signed)
Hematology/Oncology Consult note [facility]  Telephone:([phone] [phone]  Patient Care Team: Freddy Finner, NP as PCP - General (Nurse Practitioner)   Name of the patient: Lori Bentley  [id number]  1985-12-29   Date of visit: 09/21/16  Diagnosis- iron deficiency anemia likely secondary to menorrhagia  Chief complaint/ Reason for visit- routine f/u of iron deficiency anemia  Heme/Onc history: Patient is a 31 year old female who was last by Dr. Alvy Bimler in October 2017 for severe iron deficiency anemia likely secondary to menorrhagia. Patient was hospitalized in September 2017 due to severe iron deficiency with melena and hematemesis. At that time her hemoglobin was low at 5.2 when she received 2 units of blood transfusion as well as protonix. She underwent EGD in October 2017 which showed localized mild mucosal changes characterized by rings in the lower one third of the esophagus. Patchy mild erythematous mucosa without bleeding found in the gastric body. Duodenal mucosal atrophy which was biopsied. Biopsy was negative for H. pylori and malignancy. Her H&H on 05/16/2016 was 8.5/28.4 with an MCV of 67.9. She received blood transfusion followed by IV iron.   Interval history- reports that she still gets menstrual cramps but her menstrual cycles are getting lighter. She has also not been using any NSAIDs presently    Review of systems- Review of Systems  Constitutional: Negative for chills, fever, malaise/fatigue and weight loss.  HENT: Negative for congestion, ear discharge and nosebleeds.   Eyes: Negative for blurred vision.  Respiratory: Negative for cough, hemoptysis, sputum production, shortness of breath and wheezing.   Cardiovascular: Negative for chest pain, palpitations, orthopnea and claudication.  Gastrointestinal: Negative for abdominal pain, blood in stool, constipation, diarrhea, heartburn, melena, nausea and vomiting.    Genitourinary: Negative for dysuria, flank pain, frequency, hematuria and urgency.  Musculoskeletal: Negative for back pain, joint pain and myalgias.  Skin: Negative for rash.  Neurological: Negative for dizziness, tingling, focal weakness, seizures, weakness and headaches.  Endo/Heme/Allergies: Does not bruise/bleed easily.  Psychiatric/Behavioral: Negative for depression and suicidal ideas. The patient does not have insomnia.      Current treatment- IV iron  Allergies  Allergen Reactions  . Percocet [Oxycodone-Acetaminophen] Swelling     Past Medical History:  Diagnosis Date  . Anxiety   . Asthma   . Chronic anemia   . Depression   . Diabetes ([place])   . GERD (gastroesophageal reflux disease)      Past Surgical History:  Procedure Laterality Date  . ESOPHAGOGASTRODUODENOSCOPY (EGD) WITH PROPOFOL N/A 05/18/2016   Procedure: ESOPHAGOGASTRODUODENOSCOPY (EGD) WITH PROPOFOL;  Surgeon: Manya Silvas, MD;  Location: [facility] ENDOSCOPY;  Service: Endoscopy;  Laterality: N/A;  . TUBAL LIGATION      Social History   Social History  . Marital status: Single    Spouse name: N/A  . Number of children: N/A  . Years of education: N/A   Occupational History  . Not on file.   Social History Main Topics  . Smoking status: Current Some Day Smoker  . Smokeless tobacco: Never Used  . Alcohol use Yes  . Drug use: No  . Sexual activity: Not on file   Other Topics Concern  . Not on file   Social History Narrative  . No narrative on file    Family History  Problem Relation Age of Onset  . Anxiety disorder Mother   . Asthma Mother   . GER disease Father      Current Outpatient Prescriptions:  .  albuterol (PROVENTIL HFA;VENTOLIN HFA) 108 (90 Base) MCG/ACT inhaler, Inhale 2 puffs into the lungs every 6 (six) hours as needed for wheezing., Disp: , Rfl:  .  cholecalciferol (VITAMIN D) 1000 units tablet, Take 1,000 Units by mouth daily., Disp: , Rfl:  .  docusate sodium  (COLACE) 100 MG capsule, Take 100 mg by mouth daily., Disp: , Rfl:  .  escitalopram (LEXAPRO) 20 MG tablet, Take 20 mg by mouth daily., Disp: , Rfl:  .  ferrous sulfate 325 (65 FE) MG tablet, Take 325 mg by mouth daily., Disp: , Rfl:  .  levocetirizine (XYZAL) 5 MG tablet, Take 5 mg by mouth every evening., Disp: , Rfl:  .  montelukast (SINGULAIR) 10 MG tablet, Take 10 mg by mouth at bedtime., Disp: , Rfl:  .  pantoprazole (PROTONIX) 40 MG tablet, Take 1 tablet (40 mg total) by mouth 2 (two) times daily., Disp: 60 tablet, Rfl: 0  Physical exam:  Vitals:   09/21/16 1524  BP: 120/84  Pulse: 67  Resp: 18  Temp: 97.3 F (36.3 C)  TempSrc: Tympanic  Weight: 200 lb 1.1 oz (90.8 kg)   Physical Exam  Constitutional: She is oriented to person, place, and time and well-developed, well-nourished, and in no distress.  HENT:  Head: Normocephalic and atraumatic.  Eyes: EOM are normal. Pupils are equal, round, and reactive to light.  Neck: Normal range of motion.  Cardiovascular: Normal rate, regular rhythm and normal heart sounds.   Pulmonary/Chest: Effort normal and breath sounds normal.  Abdominal: Soft. Bowel sounds are normal.  Neurological: She is alert and oriented to person, place, and time.  Skin: Skin is warm and dry.     CMP Latest Ref Rng & Units 04/24/2016  Glucose 65 - 99 mg/dL 99  BUN 6 - 20 mg/dL 7  Creatinine 0.44 - 1.00 mg/dL 0.83  Sodium 135 - 145 mmol/L 139  Potassium 3.5 - 5.1 mmol/L 3.7  Chloride 101 - 111 mmol/L 106  CO2 22 - 32 mmol/L 27  Calcium 8.9 - 10.3 mg/dL 8.5(L)  Total Protein 6.5 - 8.1 g/dL -  Total Bilirubin 0.3 - 1.2 mg/dL -  Alkaline Phos 38 - 126 U/L -  AST 15 - 41 U/L -  ALT 14 - 54 U/L -   CBC Latest Ref Rng & Units 09/21/2016  WBC 3.6 - 11.0 K/uL 7.4  Hemoglobin 12.0 - 16.0 g/dL 12.0  Hematocrit 35.0 - 47.0 % 36.2  Platelets 150 - 440 K/uL 409      Assessment and plan- Patient is a 31 y.o. female who sees [place] for iron deficiency anemia  secondary to menorrhagia  1 iron studies from today are pending however his H&H has significantly improved from 12/36.2 today from 8.4/28.44 months ago. Patient is also continuing to take oral iron 325 mg twice a day without any significant side effects. We will see her back in 4 months time with repeat CBC and iron studies. If her ferritin is very low today we will give her a call and make arrangements for IV iron. We have also given her information about GYN referral.    Visit Diagnosis 1. Iron deficiency anemia due to chronic blood loss      Dr. Randa Evens, MD, MPH [facility] Pager- [phone] 09/21/2016 4:04 PM

## 2016-09-24 ENCOUNTER — Telehealth: Payer: Self-pay | Admitting: *Deleted

## 2016-09-24 NOTE — Telephone Encounter (Signed)
Called patient to let her know Dr Janese Banks evaluated her bloodwork and feels patient would benefit from 2 feraheme infusions.  Patient is agreeable to this; She would like to come this Weds at 3:00-3:15p and the following Weds. Will send a scheduling request.

## 2016-09-26 ENCOUNTER — Inpatient Hospital Stay: Payer: Medicaid Other

## 2016-09-26 VITALS — BP 116/82 | HR 80 | Temp 96.9°F | Resp 20

## 2016-09-26 DIAGNOSIS — D500 Iron deficiency anemia secondary to blood loss (chronic): Secondary | ICD-10-CM | POA: Diagnosis not present

## 2016-09-26 MED ORDER — SODIUM CHLORIDE 0.9 % IV SOLN
510.0000 mg | Freq: Once | INTRAVENOUS | Status: AC
  Administered 2016-09-26: 510 mg via INTRAVENOUS
  Filled 2016-09-26: qty 17

## 2016-09-26 MED ORDER — SODIUM CHLORIDE 0.9 % IV SOLN
Freq: Once | INTRAVENOUS | Status: AC
  Administered 2016-09-26: 15:00:00 via INTRAVENOUS
  Filled 2016-09-26: qty 1000

## 2016-10-03 ENCOUNTER — Inpatient Hospital Stay: Payer: Self-pay

## 2016-10-10 ENCOUNTER — Inpatient Hospital Stay: Payer: Medicaid Other | Attending: Oncology

## 2016-10-10 DIAGNOSIS — Z79899 Other long term (current) drug therapy: Secondary | ICD-10-CM | POA: Insufficient documentation

## 2016-10-10 DIAGNOSIS — D500 Iron deficiency anemia secondary to blood loss (chronic): Secondary | ICD-10-CM | POA: Insufficient documentation

## 2016-10-15 ENCOUNTER — Inpatient Hospital Stay: Payer: Medicaid Other

## 2016-10-15 VITALS — BP 116/81 | HR 80 | Temp 97.6°F | Resp 19

## 2016-10-15 DIAGNOSIS — D500 Iron deficiency anemia secondary to blood loss (chronic): Secondary | ICD-10-CM | POA: Diagnosis present

## 2016-10-15 DIAGNOSIS — Z79899 Other long term (current) drug therapy: Secondary | ICD-10-CM | POA: Diagnosis not present

## 2016-10-15 MED ORDER — SODIUM CHLORIDE 0.9 % IV SOLN
Freq: Once | INTRAVENOUS | Status: AC
  Administered 2016-10-15: 14:00:00 via INTRAVENOUS
  Filled 2016-10-15: qty 1000

## 2016-10-15 MED ORDER — SODIUM CHLORIDE 0.9 % IV SOLN
510.0000 mg | Freq: Once | INTRAVENOUS | Status: AC
  Administered 2016-10-15: 510 mg via INTRAVENOUS
  Filled 2016-10-15: qty 17

## 2016-10-15 NOTE — Patient Instructions (Signed)

## 2016-11-22 ENCOUNTER — Encounter: Payer: Self-pay | Admitting: Emergency Medicine

## 2016-11-22 ENCOUNTER — Emergency Department
Admission: EM | Admit: 2016-11-22 | Discharge: 2016-11-22 | Disposition: A | Discharge status: Home / Self Care | Payer: Medicaid Other | Attending: Emergency Medicine | Admitting: Emergency Medicine

## 2016-11-22 DIAGNOSIS — F17210 Nicotine dependence, cigarettes, uncomplicated: Secondary | ICD-10-CM | POA: Insufficient documentation

## 2016-11-22 DIAGNOSIS — Z79899 Other long term (current) drug therapy: Secondary | ICD-10-CM | POA: Diagnosis not present

## 2016-11-22 DIAGNOSIS — F4320 Adjustment disorder, unspecified: Secondary | ICD-10-CM | POA: Diagnosis not present

## 2016-11-22 DIAGNOSIS — E119 Type 2 diabetes mellitus without complications: Secondary | ICD-10-CM | POA: Insufficient documentation

## 2016-11-22 DIAGNOSIS — J45909 Unspecified asthma, uncomplicated: Secondary | ICD-10-CM | POA: Insufficient documentation

## 2016-11-22 DIAGNOSIS — R45851 Suicidal ideations: Secondary | ICD-10-CM | POA: Diagnosis present

## 2016-11-22 LAB — PREGNANCY, URINE: Preg Test, Ur: NEGATIVE

## 2016-11-22 LAB — COMPREHENSIVE METABOLIC PANEL
ALBUMIN: 4.1 g/dL (ref 3.5–5.0)
ALK PHOS: 59 U/L (ref 38–126)
ALT: 46 U/L (ref 14–54)
ANION GAP: 9 (ref 5–15)
AST: 45 U/L — ABNORMAL HIGH (ref 15–41)
BILIRUBIN TOTAL: 0.6 mg/dL (ref 0.3–1.2)
BUN: 8 mg/dL (ref 6–20)
CO2: 25 mmol/L (ref 22–32)
CREATININE: 0.8 mg/dL (ref 0.44–1.00)
Calcium: 9 mg/dL (ref 8.9–10.3)
Chloride: 102 mmol/L (ref 101–111)
GFR calc Af Amer: >60 mL/min (ref >60)
GFR calc non Af Amer: >60 mL/min (ref >60)
GLUCOSE: 95 mg/dL (ref 65–99)
Potassium: 3.3 mmol/L — ABNORMAL LOW (ref 3.5–5.1)
SODIUM: 136 mmol/L (ref 135–145)
TOTAL PROTEIN: 7.2 g/dL (ref 6.5–8.1)

## 2016-11-22 LAB — CBC
HEMATOCRIT: 45.8 % (ref 35.0–47.0)
Hemoglobin: 15.5 g/dL (ref 12.0–16.0)
MCH: 28.2 pg (ref 26.0–34.0)
MCHC: 33.9 g/dL (ref 32.0–36.0)
MCV: 83.4 fL (ref 80.0–100.0)
Platelets: 361 10*3/uL (ref 150–440)
RBC: 5.49 MIL/uL — ABNORMAL HIGH (ref 3.80–5.20)
RDW: 17.4 % — ABNORMAL HIGH (ref 11.5–14.5)
WBC: 11.9 10*3/uL — ABNORMAL HIGH (ref 3.6–11.0)

## 2016-11-22 LAB — URINE DRUG SCREEN, QUALITATIVE (ARMC ONLY)
Amphetamines, Ur Screen: NOT DETECTED
BARBITURATES, UR SCREEN: NOT DETECTED
BENZODIAZEPINE, UR SCRN: NOT DETECTED
CANNABINOID 50 NG, UR ~~LOC~~: POSITIVE — AB
COCAINE METABOLITE, UR ~~LOC~~: NOT DETECTED
MDMA (Ecstasy)Ur Screen: NOT DETECTED
METHADONE SCREEN, URINE: NOT DETECTED
OPIATE, UR SCREEN: NOT DETECTED
PHENCYCLIDINE (PCP) UR S: NOT DETECTED
Tricyclic, Ur Screen: NOT DETECTED

## 2016-11-22 LAB — ACETAMINOPHEN LEVEL: Acetaminophen (Tylenol), Serum: <10 ug/mL — ABNORMAL LOW (ref 10–30)

## 2016-11-22 LAB — ETHANOL: Alcohol, Ethyl (B): <5 mg/dL (ref <5)

## 2016-11-22 LAB — SALICYLATE LEVEL: Salicylate Lvl: <7 mg/dL (ref 2.8–30.0)

## 2016-11-22 NOTE — ED Notes (Signed)
Pt dressed out into appropriate behavioral health clothing. Pt belongings consist of a brown shirt, black shorts, black sandals and a black bra.

## 2016-11-22 NOTE — Discharge Instructions (Signed)
Take your medicines as prescribed.   See your counselor tomorrow as scheduled  Return to ER if you have thoughts of harming yourself or others, hallucinations.

## 2016-11-22 NOTE — ED Notes (Signed)
SOC at bedside. 

## 2016-11-22 NOTE — ED Notes (Signed)
Pt. Verbalizes understanding of d/c instructions and follow-up. VS stable and pt denies pain.  Pt. In NAD at time of d/c and denies further concerns regarding this visit. Pt. Stable at the time of departure from the unit, departing unit by the safest and most appropriate manner per that pt condition and limitations. Pt advised to return to the ED at any time for emergent concerns, or for new/worsening symptoms.   

## 2016-11-22 NOTE — ED Provider Notes (Signed)
[facility] Provider Note   CSN: [id number] Arrival date & time: 11/22/16  1752     History   Chief Complaint Chief Complaint  Patient presents with  . Suicidal    HPI Lori Bentley is a 31 y.o. female hx of anxiety, depression, DM, here with suicidal ideation. Patient states that she has 4 kids at home. Her 44 year old daughter has been giving her attitude and she felt frustrated. She felt like she is a bad mother and felt very overwhelmed. She then locked herself in the bathroom and told her boyfriend that she wanted to drown herself and cut herself. Patient states that she felt better now and doesn't really want to kill herself. Her boyfriend called police and she was IVC by police and brought to the ED.   The history is provided by the patient.    Past Medical History:  Diagnosis Date  . Anxiety   . Asthma   . Chronic anemia   . Depression   . Diabetes ([place])   . GERD (gastroesophageal reflux disease)     Patient Active Problem List   Diagnosis Date Noted  . IDA (iron deficiency anemia) 05/16/2016  . New onset of headaches 05/16/2016  . Menorrhagia with regular cycle 05/16/2016  . Dehydration 05/16/2016  . Nausea with vomiting 05/16/2016  . Blood loss anemia 04/23/2016  . Hematemesis 04/23/2016  . Melena 04/23/2016  . Diabetes ([place]) 04/23/2016  . GERD (gastroesophageal reflux disease) 04/23/2016    Past Surgical History:  Procedure Laterality Date  . ESOPHAGOGASTRODUODENOSCOPY (EGD) WITH PROPOFOL N/A 05/18/2016   Procedure: ESOPHAGOGASTRODUODENOSCOPY (EGD) WITH PROPOFOL;  Surgeon: Manya Silvas, MD;  Location: [facility] ENDOSCOPY;  Service: Endoscopy;  Laterality: N/A;  . TUBAL LIGATION      OB History    No data available       Home Medications    Prior to Admission medications   Medication Sig Start Date End Date Taking? Authorizing Provider  albuterol (PROVENTIL HFA;VENTOLIN HFA) 108 (90 Base) MCG/ACT inhaler Inhale 2 puffs into  the lungs every 6 (six) hours as needed for wheezing. 09/01/14   Historical Provider, MD  cholecalciferol (VITAMIN D) 1000 units tablet Take 1,000 Units by mouth daily.    Historical Provider, MD  Cholecalciferol (VITAMIN D-1000 MAX ST) 1000 units tablet Take by mouth.    Historical Provider, MD  docusate sodium (COLACE) 100 MG capsule Take 100 mg by mouth daily.    Historical Provider, MD  escitalopram (LEXAPRO) 20 MG tablet Take 20 mg by mouth daily. 12/10/14   Historical Provider, MD  ferrous sulfate 325 (65 FE) MG tablet Take 325 mg by mouth daily.    Historical Provider, MD  levocetirizine (XYZAL) 5 MG tablet Take 5 mg by mouth every evening.    Historical Provider, MD  montelukast (SINGULAIR) 10 MG tablet Take 10 mg by mouth at bedtime.    Historical Provider, MD  Multiple Vitamin (MULTI-VITAMINS) TABS Take by mouth.    Historical Provider, MD  pantoprazole (PROTONIX) 20 MG tablet Take 20 mg by mouth 2 (two) times daily.    Historical Provider, MD  pantoprazole (PROTONIX) 40 MG tablet Take 1 tablet (40 mg total) by mouth 2 (two) times daily. 04/25/16 05/25/16  Lytle Butte, MD    Family History Family History  Problem Relation Age of Onset  . Anxiety disorder Mother   . Asthma Mother   . GER disease Father     Social History Social History  Substance Use  Topics  . Smoking status: Current Some Day Smoker    Packs/day: 0.25    Types: Cigarettes  . Smokeless tobacco: Never Used  . Alcohol use Yes     Allergies   Oxycodone-acetaminophen; Percocet [oxycodone-acetaminophen]; and Phenylephrine-guaifenesin   Review of Systems Review of Systems  Psychiatric/Behavioral: Positive for dysphoric mood and suicidal ideas.  All other systems reviewed and are negative.    Physical Exam Updated Vital Signs BP 123/84 (BP Location: Right Arm)   Pulse 75   Temp 98.5 F (36.9 C) (Oral)   Resp 18   Ht 5' (1.524 m)   Wt 175 lb (79.4 kg)   LMP 10/31/2016   SpO2 97%   BMI 34.18 kg/m     Physical Exam  Constitutional: She is oriented to person, place, and time.  Depressed, tearful   HENT:  Head: Normocephalic.  Mouth/Throat: Oropharynx is clear and moist.  Eyes: EOM are normal. Pupils are equal, round, and reactive to light.  Neck: Normal range of motion. Neck supple.  Cardiovascular: Normal rate, regular rhythm and normal heart sounds.   Pulmonary/Chest: Effort normal and breath sounds normal. No respiratory distress. She has no wheezes.  Abdominal: Soft. Bowel sounds are normal. She exhibits no distension. There is no tenderness.  Musculoskeletal: Normal range of motion.  Neurological: She is alert and oriented to person, place, and time. No cranial nerve deficit. Coordination normal.  Skin: Skin is warm.  Psychiatric:  Depressed, denies being suicidal   Nursing note and vitals reviewed.    ED Treatments / Results  Labs (all labs ordered are listed, but only abnormal results are displayed) Labs Reviewed  COMPREHENSIVE METABOLIC PANEL - Abnormal; Notable for the following:       Result Value   Potassium 3.3 (*)    AST 45 (*)    All other components within normal limits  ACETAMINOPHEN LEVEL - Abnormal; Notable for the following:    Acetaminophen (Tylenol), Serum <10 (*)    All other components within normal limits  CBC - Abnormal; Notable for the following:    WBC 11.9 (*)    RBC 5.49 (*)    RDW 17.4 (*)    All other components within normal limits  URINE DRUG SCREEN, QUALITATIVE ([facility] ONLY) - Abnormal; Notable for the following:    Cannabinoid 50 Ng, Ur [place] POSITIVE (*)    All other components within normal limits  ETHANOL  SALICYLATE LEVEL  PREGNANCY, URINE    EKG  EKG Interpretation None       Radiology No results found.  Procedures Procedures (including critical care time)  Medications Ordered in ED Medications - No data to display   Initial Impression / Assessment and Plan / ED Course  I have reviewed the triage vital signs and  the nursing notes.  Pertinent labs & imaging results that were available during my care of the patient were reviewed by me and considered in my medical decision making (see chart for details).     Lori Bentley is a 31 y.o. female here with suicidal ideation. She states that she just felt overwhelmed but felt better now. IVC by police. She request to go home. Will consult telepsych, TTS. Will get psych clearance labs.   10:45 PM Labs unremarkable. UDS + marijuana. [facility] psychiatry saw patient and reversed IVC. Stable for discharge. Likely adjustment disorder.   Final Clinical Impressions(s) / ED Diagnoses   Final diagnoses:  None    New Prescriptions New Prescriptions   No  medications on file     Drenda Freeze, MD 11/22/16 2246

## 2016-11-22 NOTE — BH Assessment (Signed)
Assessment Note  Lori Bentley is an 31 y.o. female, with a history of anxiety attacks, presenting to the ED under IVC with concerns of suicidal ideations with a plan to cut her wrists and drown herself in the bathtub.  Pt is currently denying intent while in the ED. Pt states she is currently feeling overwhelmed by multiple stressors.  She states that she has five children and has little support other than her mother and boyfriend.  She reports that one her of daughters has serious behavior problems and has been suspended from school.  She also reports that she has a son who is epileptic.  Pt states she is unable to work because caring for her children takes up all of her time.  Pt reports currently receiving outpatient mental health services thru [facility].  She states she has an appointment tomorrow with her therapist, whom she sees on a biweekly basis. She says that she is currently prescribed Lexapro.  She states she normally calls her therapist when feeling overwhelmed.  She also states that she normally uses deep breathing exercises to control her anxiety but says that she was feeling too overwhelmed this evening to use the exercises.  Pt reports she ran the bathtub full of water and and laid in the tub until she could calm down.  She says that she had no intention of drowning herself because no one would be able to take care of her children.    Pt denies HI and any auditory/visual hallucinations.  She denies regular drug use.  Her UDS was positive for cannabis.     Diagnosis: Anxiety Disorder  Past Medical History:  Past Medical History:  Diagnosis Date  . Anxiety   . Asthma   . Chronic anemia   . Depression   . Diabetes ([place])   . GERD (gastroesophageal reflux disease)     Past Surgical History:  Procedure Laterality Date  . ESOPHAGOGASTRODUODENOSCOPY (EGD) WITH PROPOFOL N/A 05/18/2016   Procedure: ESOPHAGOGASTRODUODENOSCOPY (EGD) WITH PROPOFOL;  Surgeon: Manya Silvas,  MD;  Location: [facility] ENDOSCOPY;  Service: Endoscopy;  Laterality: N/A;  . TUBAL LIGATION      Family History:  Family History  Problem Relation Age of Onset  . Anxiety disorder Mother   . Asthma Mother   . GER disease Father     Social History:  reports that she has been smoking Cigarettes.  She has been smoking about 0.25 packs per day. She has never used smokeless tobacco. She reports that she drinks alcohol. She reports that she does not use drugs.  Additional Social History:  Alcohol / Drug Use Pain Medications: See PTA Prescriptions: See PTA Over the Counter: See PTA History of alcohol / drug use?: No history of alcohol / drug abuse  CIWA: CIWA-Ar BP: 123/84 Pulse Rate: 75 COWS:    Allergies:  Allergies  Allergen Reactions  . Oxycodone-Acetaminophen Swelling  . Percocet [Oxycodone-Acetaminophen] Swelling  . Phenylephrine-Guaifenesin     Home Medications:  (Not in a hospital admission)  OB/GYN Status:  Patient's last menstrual period was 10/31/2016.  General Assessment Data Location of Assessment: [facility] ED TTS Assessment: In system Is this a Tele or Face-to-Face Assessment?: Face-to-Face Is this an Initial Assessment or a Re-assessment for this encounter?: Initial Assessment Marital status: Single Maiden name: na Is patient pregnant?: No Pregnancy Status: No Living Arrangements: Spouse/significant other, Children Can pt return to current living arrangement?: Yes Admission Status: Involuntary Is patient capable of signing voluntary admission?: Yes Referral Source:  Self/Family/Friend Insurance type: Medicaid  Medical Screening Exam ([place]) Medical Exam completed: Yes  Crisis Care Plan Living Arrangements: Spouse/significant other, Children Legal Guardian: Other: (self) Name of Psychiatrist: [facility] Name of Therapist: [facility]  Education Status Is patient currently in school?: No Current Grade: na Highest grade of school  patient has completed: na Name of school: na Contact person: na  Risk to self with the past 6 months Suicidal Ideation: Yes-Currently Present Has patient been a risk to self within the past 6 months prior to admission? : No Suicidal Intent: No Has patient had any suicidal intent within the past 6 months prior to admission? : No Is patient at risk for suicide?: Yes Suicidal Plan?: Yes-Currently Present Has patient had any suicidal plan within the past 6 months prior to admission? : No Specify Current Suicidal Plan: Pt reports she had planned to cut her wrists and drown herself Access to Means: Yes Specify Access to Suicidal Means: Access to bathtub and sharp objects What has been your use of drugs/alcohol within the last 12 months?: Cannabis Previous Attempts/Gestures: No How many times?: 0 Other Self Harm Risks: None identified Triggers for Past Attempts: None known Intentional Self Injurious Behavior: None Family Suicide History: No Recent stressful life event(s): Job Loss, Financial Problems, Turmoil (Comment) Persecutory voices/beliefs?: No Depression: Yes Depression Symptoms: Fatigue, Loss of interest in usual pleasures, Feeling worthless/self pity, Feeling angry/irritable Substance abuse history and/or treatment for substance abuse?: No Suicide prevention information given to non-admitted patients: Not applicable  Risk to Others within the past 6 months Homicidal Ideation: No Does patient have any lifetime risk of violence toward others beyond the six months prior to admission? : No Thoughts of Harm to Others: No Current Homicidal Intent: No Current Homicidal Plan: No Access to Homicidal Means: No Identified Victim: None identified History of harm to others?: No Assessment of Violence: None Noted Violent Behavior Description: none identified Does patient have access to weapons?: No Criminal Charges Pending?: No Does patient have a court date: No Is patient on  probation?: No  Psychosis Hallucinations: None noted Delusions: None noted  Mental Status Report Appearance/Hygiene: In scrubs Eye Contact: Good Motor Activity: Freedom of movement Speech: Logical/coherent Level of Consciousness: Alert Mood: Anxious, Irritable Affect: Appropriate to circumstance, Anxious, Irritable Anxiety Level: Moderate Thought Processes: Relevant, Coherent Judgement: Partial Orientation: Person, Place, Time, Situation Obsessive Compulsive Thoughts/Behaviors: None  Cognitive Functioning Concentration: Normal Memory: Recent Intact, Remote Intact IQ: Average Insight: Fair Impulse Control: Fair Appetite: Fair Weight Loss: 0 Weight Gain: 0 Sleep: No Change Vegetative Symptoms: None  ADLScreening [facility] Assessment Services) Patient's cognitive ability adequate to safely complete daily activities?: Yes Patient able to express need for assistance with ADLs?: Yes Independently performs ADLs?: Yes (appropriate for developmental age)  Prior Inpatient Therapy Prior Inpatient Therapy: No Prior Therapy Dates: na Prior Therapy Facilty/Provider(s): na Reason for Treatment: na  Prior Outpatient Therapy Prior Outpatient Therapy: Yes Prior Therapy Dates: current Prior Therapy Facilty/Provider(s): [facility] Reason for Treatment: anxiety Does patient have an ACCT team?: No Does patient have Intensive In-House Services?  : No Does patient have [facility] services? : No Does patient have P4CC services?: No  ADL Screening (condition at time of admission) Patient's cognitive ability adequate to safely complete daily activities?: Yes Patient able to express need for assistance with ADLs?: Yes Independently performs ADLs?: Yes (appropriate for developmental age)       Abuse/Neglect Assessment (Assessment to be complete while patient is alone) Physical Abuse: Denies  Verbal Abuse: Denies Sexual Abuse: Denies Exploitation of patient/patient's resources:  Denies Self-Neglect: Denies Values / Beliefs Cultural Requests During Hospitalization: None Spiritual Requests During Hospitalization: None Consults Spiritual Care Consult Needed: No Social Work Consult Needed: No Regulatory affairs officer (For Healthcare) Does Patient Have a Medical Advance Directive?: No Would patient like information on creating a medical advance directive?: No - Patient declined    Additional Information 1:1 In Past 12 Months?: No CIRT Risk: No Elopement Risk: No Does patient have medical clearance?: Yes     Disposition:  Disposition Initial Assessment Completed for this Encounter: Yes Disposition of Patient: Other dispositions Other disposition(s): Other (Comment) (Pending Psych MD consult)  On Site Evaluation by:   Reviewed with Physician:    Oneita Hurt 11/22/2016 8:58 PM

## 2016-11-22 NOTE — ED Triage Notes (Signed)
Pt to ED via police IVC c/o SI.  Patient states feeling overwhelmed at home and told boyfriend that she would cut her wrists and drown herself in bathtub.  Patient denies attempts in the past, denies current SI/HI.

## 2016-12-20 ENCOUNTER — Inpatient Hospital Stay: Payer: Medicaid Other

## 2016-12-20 ENCOUNTER — Inpatient Hospital Stay: Payer: Medicaid Other | Admitting: Oncology

## 2017-08-21 ENCOUNTER — Emergency Department: Admission: EM | Admit: 2017-08-21 | Discharge: 2017-08-21 | Discharge status: Absent without leave | Payer: Self-pay

## 2017-09-03 ENCOUNTER — Encounter: Payer: Self-pay | Admitting: Emergency Medicine

## 2017-09-03 ENCOUNTER — Other Ambulatory Visit: Payer: Self-pay

## 2017-09-03 ENCOUNTER — Emergency Department
Admission: EM | Admit: 2017-09-03 | Discharge: 2017-09-03 | Disposition: A | Discharge status: Home / Self Care | Payer: Medicaid Other | Attending: Emergency Medicine | Admitting: Emergency Medicine

## 2017-09-03 DIAGNOSIS — J45998 Other asthma: Secondary | ICD-10-CM | POA: Insufficient documentation

## 2017-09-03 DIAGNOSIS — F17210 Nicotine dependence, cigarettes, uncomplicated: Secondary | ICD-10-CM | POA: Diagnosis not present

## 2017-09-03 DIAGNOSIS — E119 Type 2 diabetes mellitus without complications: Secondary | ICD-10-CM | POA: Diagnosis not present

## 2017-09-03 DIAGNOSIS — Z79899 Other long term (current) drug therapy: Secondary | ICD-10-CM | POA: Diagnosis not present

## 2017-09-03 DIAGNOSIS — R05 Cough: Secondary | ICD-10-CM | POA: Diagnosis present

## 2017-09-03 DIAGNOSIS — F329 Major depressive disorder, single episode, unspecified: Secondary | ICD-10-CM | POA: Diagnosis not present

## 2017-09-03 DIAGNOSIS — J45909 Unspecified asthma, uncomplicated: Secondary | ICD-10-CM

## 2017-09-03 MED ORDER — ALBUTEROL SULFATE (2.5 MG/3ML) 0.083% IN NEBU: 2.5000 mg | INHALATION_SOLUTION | Freq: Four times a day (QID) | RESPIRATORY_TRACT | 12 refills | Status: DC | PRN

## 2017-09-03 MED ORDER — ALBUTEROL SULFATE HFA 108 (90 BASE) MCG/ACT IN AERS: 2.0000 | INHALATION_SPRAY | Freq: Four times a day (QID) | RESPIRATORY_TRACT | 3 refills | Status: DC | PRN

## 2017-09-03 MED ORDER — AZITHROMYCIN 250 MG PO TABS: ORAL_TABLET | ORAL | 0 refills | Status: DC

## 2017-09-03 MED ORDER — METHYLPREDNISOLONE 4 MG PO TBPK: ORAL_TABLET | ORAL | 0 refills | Status: DC

## 2017-09-03 NOTE — Discharge Instructions (Signed)
Follow-up with your regular doctor if you are not better in 3-5 days.  Use medication as prescribed.  Return to the emergency department if you are worsening.

## 2017-09-03 NOTE — ED Provider Notes (Signed)
[facility] Emergency Department Provider Note  ____________________________________________   First MD Initiated Contact with Patient 09/03/17 2132     (approximate)  I have reviewed the triage vital signs and the nursing notes.   HISTORY  Chief Complaint Cough    HPI Greta Lehan is a 32 y.o. female complains of cough and congestion for 3 days.  She has history of asthma.  She is wheezing more frequently.  She is out of her inhaler.  She denies fever.  She states she has body aches all over.  She did not get a flu vaccine this year.  She states the mucus is green and yellow when she coughs.  She denies chest pain or shortness of breath  Past Medical History:  Diagnosis Date  . Anxiety   . Asthma   . Chronic anemia   . Depression   . Diabetes ([place])   . GERD (gastroesophageal reflux disease)     Patient Active Problem List   Diagnosis Date Noted  . IDA (iron deficiency anemia) 05/16/2016  . New onset of headaches 05/16/2016  . Menorrhagia with regular cycle 05/16/2016  . Dehydration 05/16/2016  . Nausea with vomiting 05/16/2016  . Blood loss anemia 04/23/2016  . Hematemesis 04/23/2016  . Melena 04/23/2016  . Diabetes ([place]) 04/23/2016  . GERD (gastroesophageal reflux disease) 04/23/2016    Past Surgical History:  Procedure Laterality Date  . ESOPHAGOGASTRODUODENOSCOPY (EGD) WITH PROPOFOL N/A 05/18/2016   Procedure: ESOPHAGOGASTRODUODENOSCOPY (EGD) WITH PROPOFOL;  Surgeon: Manya Silvas, MD;  Location: [facility] ENDOSCOPY;  Service: Endoscopy;  Laterality: N/A;  . TUBAL LIGATION      Prior to Admission medications   Medication Sig Start Date End Date Taking? Authorizing Provider  albuterol (PROVENTIL HFA;VENTOLIN HFA) 108 (90 Base) MCG/ACT inhaler Inhale 2 puffs into the lungs every 6 (six) hours as needed for wheezing. 09/03/17   Caryn Section Linden Dolin, PA-C  albuterol (PROVENTIL) (2.5 MG/3ML) 0.083% nebulizer solution Take 3 mLs (2.5 mg total)  by nebulization every 6 (six) hours as needed for wheezing or shortness of breath. 09/03/17   Caryn Section Linden Dolin, PA-C  azithromycin (ZITHROMAX Z-PAK) 250 MG tablet 2 pills today then 1 pill a day for 4 days 09/03/17   Versie Starks, PA-C  cholecalciferol (VITAMIN D) 1000 units tablet Take 1,000 Units by mouth daily.    [provider]  Cholecalciferol (VITAMIN D-1000 MAX ST) 1000 units tablet Take by mouth.    [provider]  docusate sodium (COLACE) 100 MG capsule Take 100 mg by mouth daily.    [provider]  escitalopram (LEXAPRO) 20 MG tablet Take 20 mg by mouth daily. 12/10/14   [provider]  ferrous sulfate 325 (65 FE) MG tablet Take 325 mg by mouth daily.    [provider]  levocetirizine (XYZAL) 5 MG tablet Take 5 mg by mouth every evening.    [provider]  methylPREDNISolone (MEDROL DOSEPAK) 4 MG TBPK tablet Take 6 pills on day one then decrease by 1 pill each day 09/03/17   Versie Starks, PA-C  montelukast (SINGULAIR) 10 MG tablet Take 10 mg by mouth at bedtime.    [provider]  Multiple Vitamin (MULTI-VITAMINS) TABS Take by mouth.    [provider]  pantoprazole (PROTONIX) 20 MG tablet Take 20 mg by mouth 2 (two) times daily.    [provider]  pantoprazole (PROTONIX) 40 MG tablet Take 1 tablet (40 mg total) by mouth 2 (two)  times daily. 04/25/16 05/25/16  Hower, Aaron Mose, MD    Allergies Oxycodone-acetaminophen; Percocet [oxycodone-acetaminophen]; and Phenylephrine-guaifenesin  Family History  Problem Relation Age of Onset  . Anxiety disorder Mother   . Asthma Mother   . GER disease Father     Social History Social History   Tobacco Use  . Smoking status: Current Some Day Smoker    Packs/day: 0.25    Types: Cigarettes  . Smokeless tobacco: Never Used  Substance Use Topics  . Alcohol use: Yes  . Drug use: No    Review of Systems  Constitutional: No fever/chills, positive for body  aches Eyes: No visual changes. ENT: No sore throat.  Positive for runny nose and congestion Respiratory: Positive cough Cardiac: Denies chest pain Gastrointestinal: Denies vomiting or diarrhea Genitourinary: Negative for dysuria. Musculoskeletal: Negative for back pain. Skin: Negative for rash.    ____________________________________________   PHYSICAL EXAM:  VITAL SIGNS: ED Triage Vitals  Enc Vitals Group     BP 09/03/17 2040 119/88     Pulse Rate 09/03/17 2040 93     Resp 09/03/17 2040 20     Temp 09/03/17 2040 99.3 F (37.4 C)     Temp Source 09/03/17 2040 Oral     SpO2 09/03/17 2040 99 %     Weight 09/03/17 2039 187 lb (84.8 kg)     Height 09/03/17 2039 5' (1.524 m)     Head Circumference --      Peak Flow --      Pain Score 09/03/17 2038 9     Pain Loc --      Pain Edu? --      Excl. in [place]? --     Constitutional: Alert and oriented. Well appearing and in no acute distress. Eyes: Conjunctivae are normal.  Head: Atraumatic. Nose: No congestion/rhinnorhea. Mouth/Throat: Mucous membranes are moist.  Throat is irritated Cardiovascular: Normal rate, regular rhythm.  Heart sounds are normal Respiratory: Normal respiratory effort.  No retractions, lungs with scattered wheezing throughout all lung fields  GU: deferred Musculoskeletal: FROM all extremities, warm and well perfused Neurologic:  Normal speech and language.  Skin:  Skin is warm, dry and intact. No rash noted. Psychiatric: Mood and affect are normal. Speech and behavior are normal.  ____________________________________________   LABS (all labs ordered are listed, but only abnormal results are displayed)  Labs Reviewed - No data to display ____________________________________________   ____________________________________________  RADIOLOGY    ____________________________________________   PROCEDURES  Procedure(s) performed:  No  Procedures    ____________________________________________   INITIAL IMPRESSION / ASSESSMENT AND PLAN / ED COURSE  Pertinent labs & imaging results that were available during my care of the patient were reviewed by me and considered in my medical decision making (see chart for details).  Patient is a 32 year old female complaining of cough and congestion.  States she is having to use her inhaler more often.  She has history of asthma.  She also states she is running out of her inhaler.  Is having to use her child's nebulizer machine.  She denies fever or chills.  She does states she has body aches.  Mucus is been green and yellow  On physical exam patient appears well.  The lungs are mostly clear to auscultation but there is some scattered wheezing  Diagnosis acute asthmatic bronchitis.  Patient was given a prescription for Z-Pak, Medrol Dosepak, albuterol inhaler, and albuterol Nebules.  She was instructed to follow-up with her regular doctor.  If  she is not improving in 3 days she should see her doctor or return to the emergency department.  Patient states she understands will comply with her treatment plan.  She was discharged in stable condition     As part of my medical decision making, I reviewed the following data within the [facility] notes reviewed and incorporated, Old chart reviewed, Notes from prior ED visits and [place] Controlled Substance Database  ____________________________________________   FINAL CLINICAL IMPRESSION(S) / ED DIAGNOSES  Final diagnoses:  Acute asthmatic bronchitis      NEW MEDICATIONS STARTED DURING THIS VISIT:  New Prescriptions   ALBUTEROL (PROVENTIL) (2.5 MG/3ML) 0.083% NEBULIZER SOLUTION    Take 3 mLs (2.5 mg total) by nebulization every 6 (six) hours as needed for wheezing or shortness of breath.   AZITHROMYCIN (ZITHROMAX Z-PAK) 250 MG TABLET    2 pills today then 1 pill a day for 4 days   METHYLPREDNISOLONE  (MEDROL DOSEPAK) 4 MG TBPK TABLET    Take 6 pills on day one then decrease by 1 pill each day     Note:  This document was prepared using Dragon voice recognition software and may include unintentional dictation errors.    Versie Starks, PA-C 09/03/17 2143    Nance Pear, MD 09/03/17 2206

## 2017-09-03 NOTE — ED Triage Notes (Signed)
Patient ambulatory to triage with steady gait, without difficulty or distress noted, mask in place; pt reports prod cough green sputum, congestion, body aches since last night

## 2017-11-22 ENCOUNTER — Telehealth: Payer: Self-pay | Admitting: Obstetrics & Gynecology

## 2017-11-22 NOTE — Telephone Encounter (Signed)
[facility] referring for Dyfunctional uterine bleeding. Called and left voicemail for patient to call back.Lori KitchenMarland Bentley

## 2017-11-22 NOTE — Telephone Encounter (Signed)
Patient is schedule 12/02/17 with [facility]

## 2017-11-29 ENCOUNTER — Emergency Department
Admission: EM | Admit: 2017-11-29 | Discharge: 2017-11-29 | Disposition: A | Discharge status: Home / Self Care | Payer: Medicaid Other | Attending: Emergency Medicine | Admitting: Emergency Medicine

## 2017-11-29 ENCOUNTER — Encounter: Payer: Self-pay | Admitting: Emergency Medicine

## 2017-11-29 ENCOUNTER — Other Ambulatory Visit: Payer: Self-pay

## 2017-11-29 DIAGNOSIS — J45909 Unspecified asthma, uncomplicated: Secondary | ICD-10-CM | POA: Insufficient documentation

## 2017-11-29 DIAGNOSIS — F419 Anxiety disorder, unspecified: Secondary | ICD-10-CM | POA: Insufficient documentation

## 2017-11-29 DIAGNOSIS — G4700 Insomnia, unspecified: Secondary | ICD-10-CM

## 2017-11-29 DIAGNOSIS — F17210 Nicotine dependence, cigarettes, uncomplicated: Secondary | ICD-10-CM | POA: Diagnosis not present

## 2017-11-29 DIAGNOSIS — Z79899 Other long term (current) drug therapy: Secondary | ICD-10-CM | POA: Insufficient documentation

## 2017-11-29 DIAGNOSIS — E119 Type 2 diabetes mellitus without complications: Secondary | ICD-10-CM | POA: Diagnosis not present

## 2017-11-29 DIAGNOSIS — F329 Major depressive disorder, single episode, unspecified: Secondary | ICD-10-CM | POA: Insufficient documentation

## 2017-11-29 DIAGNOSIS — Z720 Tobacco use: Secondary | ICD-10-CM

## 2017-11-29 MED ORDER — ALBUTEROL SULFATE HFA 108 (90 BASE) MCG/ACT IN AERS: 2.0000 | INHALATION_SPRAY | Freq: Four times a day (QID) | RESPIRATORY_TRACT | 0 refills | Status: AC | PRN

## 2017-11-29 MED ORDER — LORAZEPAM 2 MG PO TABS
2.0000 mg | ORAL_TABLET | Freq: Once | ORAL | Status: AC
  Administered 2017-11-29: 2 mg via ORAL
  Filled 2017-11-29: qty 1

## 2017-11-29 MED ORDER — LORAZEPAM 1 MG PO TABS: 1.0000 mg | ORAL_TABLET | Freq: Two times a day (BID) | ORAL | 0 refills | Status: DC | PRN

## 2017-11-29 NOTE — ED Triage Notes (Signed)
Patient ambulatory to triage with steady gait, without difficulty or distress noted; pt reports taking abilify 5mg  nightly for last mo but last 2 nights unable to sleep; med changed to 2mg  but has not started yet

## 2017-11-29 NOTE — ED Notes (Signed)
Pt to the er for inability to sleep. Pt recently stopped taking leaxapro and began taking abilify. Pt says the abilify does not agree with her. Pt has also stopped smoking.

## 2017-11-29 NOTE — Discharge Instructions (Signed)
It was a pleasure to take care of you today, and thank you for coming to our emergency department.  If you have any questions or concerns before leaving please ask the nurse to grab me and I'm more than happy to go through your aftercare instructions again. ° °If you were prescribed any opioid pain medication today such as Norco, Vicodin, Percocet, morphine, hydrocodone, or oxycodone please make sure you do not drive when you are taking this medication as it can alter your ability to drive safely. ° °If you have any concerns once you are home that you are not improving or are in fact getting worse before you can make it to your follow-up appointment, please do not hesitate to call 911 and come back for further evaluation. ° °Miyako Oelke, MD ° ° ° °

## 2017-11-29 NOTE — ED Provider Notes (Signed)
[facility] Emergency Department Provider Note  ____________________________________________   First MD Initiated Contact with Patient 11/29/17 0448     (approximate)  I have reviewed the triage vital signs and the nursing notes.   HISTORY  Chief Complaint Insomnia    HPI Lori Bentley is a 32 y.o. female who self presents to the emergency department with 2 days of insomnia.  She has a long-standing history of depression and bipolar and had been taking Lexapro for about 10 years.  1 month ago her psychiatrist stopped her Lexapro which she did not wean off.  She has recently begun taking Abilify and she does not like the way the medication makes her feel.  She is also recently stopped smoking cigarettes.  She has not gotten much sleep for the past 2 days and feels quite anxious.  She denies suicidality.  She does not want to hurt herself.  She feels frustrated.  Her symptoms been gradual onset are now moderate to severe.  Nothing seems to make them better.  Past Medical History:  Diagnosis Date  . Anxiety   . Asthma   . Chronic anemia   . Depression   . Diabetes ([place])   . GERD (gastroesophageal reflux disease)     Patient Active Problem List   Diagnosis Date Noted  . IDA (iron deficiency anemia) 05/16/2016  . New onset of headaches 05/16/2016  . Menorrhagia with regular cycle 05/16/2016  . Dehydration 05/16/2016  . Nausea with vomiting 05/16/2016  . Blood loss anemia 04/23/2016  . Hematemesis 04/23/2016  . Melena 04/23/2016  . Diabetes ([place]) 04/23/2016  . GERD (gastroesophageal reflux disease) 04/23/2016    Past Surgical History:  Procedure Laterality Date  . ESOPHAGOGASTRODUODENOSCOPY (EGD) WITH PROPOFOL N/A 05/18/2016   Procedure: ESOPHAGOGASTRODUODENOSCOPY (EGD) WITH PROPOFOL;  Surgeon: Manya Silvas, MD;  Location: [facility] ENDOSCOPY;  Service: Endoscopy;  Laterality: N/A;  . TUBAL LIGATION      Prior to Admission medications     Medication Sig Start Date End Date Taking? Authorizing Provider  albuterol (PROVENTIL HFA;VENTOLIN HFA) 108 (90 Base) MCG/ACT inhaler Inhale 2 puffs into the lungs every 6 (six) hours as needed for wheezing or shortness of breath. 11/29/17   Darel Hong, MD  azithromycin (ZITHROMAX Z-PAK) 250 MG tablet 2 pills today then 1 pill a day for 4 days 09/03/17   Versie Starks, PA-C  cholecalciferol (VITAMIN D) 1000 units tablet Take 1,000 Units by mouth daily.    [provider]  Cholecalciferol (VITAMIN D-1000 MAX ST) 1000 units tablet Take by mouth.    [provider]  docusate sodium (COLACE) 100 MG capsule Take 100 mg by mouth daily.    [provider]  escitalopram (LEXAPRO) 20 MG tablet Take 20 mg by mouth daily. 12/10/14   [provider]  ferrous sulfate 325 (65 FE) MG tablet Take 325 mg by mouth daily.    [provider]  levocetirizine (XYZAL) 5 MG tablet Take 5 mg by mouth every evening.    [provider]  LORazepam (ATIVAN) 1 MG tablet Take 1 tablet (1 mg total) by mouth 2 (two) times daily as needed for anxiety or sleep. 11/29/17 11/29/18  Darel Hong, MD  methylPREDNISolone (MEDROL DOSEPAK) 4 MG TBPK tablet Take 6 pills on day one then decrease by 1 pill each day 09/03/17   Versie Starks, PA-C  montelukast (SINGULAIR) 10 MG tablet Take 10 mg by mouth at bedtime.    [provider]  Multiple Vitamin (MULTI-VITAMINS) TABS Take by mouth.    [provider]  pantoprazole (PROTONIX) 20 MG tablet Take 20 mg by mouth 2 (two) times daily.    [provider]  pantoprazole (PROTONIX) 40 MG tablet Take 1 tablet (40 mg total) by mouth 2 (two) times daily. 04/25/16 05/25/16  Hower, Aaron Mose, MD    Allergies Oxycodone-acetaminophen; Percocet [oxycodone-acetaminophen]; and Phenylephrine-guaifenesin  Family History  Problem Relation Age of Onset  . Anxiety disorder Mother   . Asthma Mother   . GER disease Father      Social History Social History   Tobacco Use  . Smoking status: Current Some Day Smoker    Packs/day: 0.25    Types: Cigarettes  . Smokeless tobacco: Never Used  Substance Use Topics  . Alcohol use: Yes  . Drug use: No    Review of Systems Constitutional: No fever/chills ENT: No sore throat. Cardiovascular: Denies chest pain. Respiratory: Denies shortness of breath. Gastrointestinal: No abdominal pain.  No nausea, no vomiting.  No diarrhea.  No constipation. Musculoskeletal: Negative for back pain. Neurological: Negative for headaches   ____________________________________________   PHYSICAL EXAM:  VITAL SIGNS: ED Triage Vitals [11/29/17 0132]  Enc Vitals Group     BP 122/86     Pulse Rate 89     Resp 18     Temp 97.9 F (36.6 C)     Temp Source Oral     SpO2 97 %     Weight 184 lb (83.5 kg)     Height 5' (1.524 m)     Head Circumference      Peak Flow      Pain Score 0     Pain Loc      Pain Edu?      Excl. in [place]?     Constitutional: Alert and oriented x4 pacing around the room somewhat anxious appearing Head: Atraumatic.  Pupils equal round reactive to light mid range and brisk Nose: No congestion/rhinnorhea. Mouth/Throat: No trismus Neck: No stridor.   Cardiovascular: Regular rate and rhythm Respiratory: Normal respiratory effort.  No retractions.  Mild expiratory wheeze in all fields Gastrointestinal: Soft nontender Neurologic:  Normal speech and language. No gross focal neurologic deficits are appreciated.  Skin:  Skin is warm, dry and intact. No rash noted.    ____________________________________________  LABS (all labs ordered are listed, but only abnormal results are displayed)  Labs Reviewed - No data to display   __________________________________________  EKG   ____________________________________________  RADIOLOGY   ____________________________________________   DIFFERENTIAL includes but not limited to  Anxiety,  insomnia, tobacco withdrawal, bronchitis   PROCEDURES  Procedure(s) performed: no  Procedures  Critical Care performed: no  Observation: no ____________________________________________   INITIAL IMPRESSION / ASSESSMENT AND PLAN / ED COURSE  Pertinent labs & imaging results that were available during my care of the patient were reviewed by me and considered in my medical decision making (see chart for details).  The patient is anxious although well-appearing.  She does have a mild wheeze so I will prescribe her albuterol for her possible COPD.  Regarding her insomnia she is not driving tonight so we will give her 2 mg of lorazepam now.  I discussed with her the side effects of stopping smoking the importance of continuing her Abilify and psychiatry follow-up.  Strict return precautions and given the patient verbalizes understanding and agreement with plan.      ____________________________________________   FINAL CLINICAL IMPRESSION(S) / ED DIAGNOSES  Final diagnoses:  Insomnia, unspecified type  Tobacco abuse      NEW MEDICATIONS STARTED DURING THIS VISIT:  Discharge Medication List as of 11/29/2017  5:01 AM    START taking these medications   Details  LORazepam (ATIVAN) 1 MG tablet Take 1 tablet (1 mg total) by mouth 2 (two) times daily as needed for anxiety or sleep., Starting Fri 11/29/2017, Until Sat 11/29/2018, Print         Note:  This document was prepared using Dragon voice recognition software and may include unintentional dictation errors.      Darel Hong, MD 11/29/17 [phone]

## 2017-11-29 NOTE — ED Notes (Addendum)
Patient ambulatory to triage with steady gait, without difficulty or distress noted; pt distressed over waiting; vs retaken at pt's request; talked at length with pt regarding her presenting c/o insomnia x 2 days; pt st that she had been taking lexapro for many yrs for bipolar with no complications; her psych changed her to abilify for unknown reason a mo ago; doesn't like the way it makes her feel so he decreased her dosage; st unable to afford new prescription at this time so cont to take original dosage and feels that this is causing her insomnia; st she has been scared to try to take any OTC meds to help her sleep as she didn't know how it would interact; pt assured that she would be eval by the ED provider; pt voices understanding and agreeance to wait and be seen

## 2017-12-02 ENCOUNTER — Encounter: Payer: Self-pay | Admitting: Obstetrics & Gynecology

## 2017-12-02 ENCOUNTER — Ambulatory Visit (INDEPENDENT_AMBULATORY_CARE_PROVIDER_SITE_OTHER): Payer: Medicaid Other | Admitting: Obstetrics & Gynecology

## 2017-12-02 VITALS — BP 130/80 | Ht 60.0 in | Wt 182.0 lb

## 2017-12-02 DIAGNOSIS — D259 Leiomyoma of uterus, unspecified: Secondary | ICD-10-CM

## 2017-12-02 DIAGNOSIS — N920 Excessive and frequent menstruation with regular cycle: Secondary | ICD-10-CM | POA: Diagnosis not present

## 2017-12-02 DIAGNOSIS — D500 Iron deficiency anemia secondary to blood loss (chronic): Secondary | ICD-10-CM

## 2017-12-02 NOTE — Progress Notes (Signed)
Consult: Dysfunctional Uterine Bleeding Patient complains of irregular menses. She had been bleeding regularly. She is now bleeding every 28 days and menses are lasting 5 days. She changes her pad or tampon every a few hours. Clots are moderate in size. Dysmenorrhea:severe, occurring throughout menses. Cyclic symptoms include: none. Current contraception: Prior BTL and recent Depo-Provera injections for recent heavy menses.  Considering future fertility if could get tubal reversed.  History of infertility: no. History of abnormal Pap smear: no.  PAP UTD, normal.  PMHx: She  has a past medical history of Anxiety, Asthma, Chronic anemia, Depression, Diabetes ([place]), and GERD (gastroesophageal reflux disease). Also,  has a past surgical history that includes Tubal ligation and Esophagogastroduodenoscopy (egd) with propofol (N/A, 05/18/2016)., family history includes Anxiety disorder in her mother; Asthma in her mother; Fibroids in her mother; GER disease in her father.,  reports that she has been smoking cigarettes.  She has been smoking about 0.25 packs per day. She has never used smokeless tobacco. She reports that she drinks alcohol. She reports that she does not use drugs.  She has a current medication list which includes the following prescription(s): escitalopram, ferrous sulfate, lorazepam, pantoprazole, albuterol, azithromycin, cholecalciferol, cholecalciferol, docusate sodium, levocetirizine, methylprednisolone, montelukast, multi-vitamins, and pantoprazole. Also, is allergic to oxycodone-acetaminophen; percocet [oxycodone-acetaminophen]; and phenylephrine-guaifenesin.  Review of Systems  Constitutional: Negative for chills, fever and malaise/fatigue.  HENT: Negative for congestion, sinus pain and sore throat.   Eyes: Negative for blurred vision and pain.  Respiratory: Positive for cough. Negative for wheezing.   Cardiovascular: Negative for chest pain and leg swelling.  Gastrointestinal:  Negative for abdominal pain, constipation, diarrhea, heartburn, nausea and vomiting.  Genitourinary: Negative for dysuria, frequency, hematuria and urgency.  Musculoskeletal: Negative for back pain, joint pain, myalgias and neck pain.  Skin: Negative for itching and rash.  Neurological: Negative for dizziness, tremors and weakness.  Endo/Heme/Allergies: Does not bruise/bleed easily.  Psychiatric/Behavioral: Positive for depression. The patient is nervous/anxious. The patient does not have insomnia.    Objective: BP 130/80   Ht 5' (1.524 m)   Wt 182 lb (82.6 kg)   LMP 11/11/2017 (Approximate)   BMI 35.54 kg/m  Physical Exam  Constitutional: She is oriented to person, place, and time. She appears well-developed and well-nourished. No distress.  Genitourinary: Rectum normal and vagina normal. Pelvic exam was performed with patient supine. There is no rash or lesion on the right labia. There is no rash or lesion on the left labia. Vagina exhibits no lesion. No bleeding in the vagina. Right adnexum does not display mass and does not display tenderness. Left adnexum does not display mass and does not display tenderness. Cervix does not exhibit motion tenderness, lesion, friability or polyp.   Uterus is enlarged, mobile and anteverted. Uterus is not exhibiting a mass.  Genitourinary Comments: 8 week size uterus  HENT:  Head: Normocephalic and atraumatic. Head is without laceration.  Right Ear: Hearing normal.  Left Ear: Hearing normal.  Nose: No epistaxis.  No foreign bodies.  Mouth/Throat: Uvula is midline, oropharynx is clear and moist and mucous membranes are normal.  Eyes: Pupils are equal, round, and reactive to light.  Neck: Normal range of motion. Neck supple. No thyromegaly present.  Cardiovascular: Normal rate and regular rhythm. Exam reveals no gallop and no friction rub.  No murmur heard. Pulmonary/Chest: Effort normal and breath sounds normal. No respiratory distress. She has no  wheezes. Right breast exhibits no mass, no skin change and no tenderness. Left breast exhibits  no mass, no skin change and no tenderness.  Abdominal: Soft. Bowel sounds are normal. She exhibits no distension. There is no tenderness. There is no rebound.  Musculoskeletal: Normal range of motion.  Neurological: She is alert and oriented to person, place, and time. No cranial nerve deficit.  Skin: Skin is warm and dry.  Psychiatric: She has a normal mood and affect. Judgment normal.  Vitals reviewed.  ASSESSMENT/PLAN:  Problem List Items Addressed This Visit    Blood loss anemia, Chronic   Menorrhagia with regular cycle - worsening and new   Uterine leiomyoma, new problem for further investigation     [place] to assess for fibroids, other etiology Consider Mirena as next option, vs Depo or other hormonal control of menorrhagia If fibroids, discuss myomectomy vs hysterectomy and [place].  Pregnancy considerations.  Patient has abnormal uterine bleeding . She has a normal exam today, with no evidence of lesions.  Evaluation includes the following: exam, labs such as hormonal testing, and pelvic ultrasound to evaluate for any structural gynecologic abnormalities.  Patient to follow up after testing.  Treatment option for menorrhagia or menometrorrhagia discussed in great detail with the patient.  Options include hormonal therapy, IUD therapy such as Mirena, D&C, Ablation, and Hysterectomy.  The pros and cons of each option discussed with patient.  Barnett Applebaum, MD, [facility], [facility] 12/02/2017  9:38 AM

## 2017-12-02 NOTE — Patient Instructions (Signed)

## 2017-12-09 ENCOUNTER — Ambulatory Visit: Payer: Medicaid Other | Admitting: Obstetrics & Gynecology

## 2017-12-09 ENCOUNTER — Encounter: Payer: Self-pay | Admitting: Obstetrics & Gynecology

## 2017-12-09 ENCOUNTER — Ambulatory Visit (INDEPENDENT_AMBULATORY_CARE_PROVIDER_SITE_OTHER): Payer: Medicaid Other

## 2017-12-09 ENCOUNTER — Other Ambulatory Visit: Payer: Medicaid Other

## 2017-12-09 ENCOUNTER — Ambulatory Visit (INDEPENDENT_AMBULATORY_CARE_PROVIDER_SITE_OTHER): Payer: Medicaid Other | Admitting: Obstetrics & Gynecology

## 2017-12-09 VITALS — BP 130/80 | Ht 60.0 in | Wt 182.0 lb

## 2017-12-09 DIAGNOSIS — D219 Benign neoplasm of connective and other soft tissue, unspecified: Secondary | ICD-10-CM | POA: Diagnosis not present

## 2017-12-09 DIAGNOSIS — N920 Excessive and frequent menstruation with regular cycle: Secondary | ICD-10-CM | POA: Diagnosis not present

## 2017-12-09 DIAGNOSIS — D500 Iron deficiency anemia secondary to blood loss (chronic): Secondary | ICD-10-CM | POA: Diagnosis not present

## 2017-12-09 NOTE — Patient Instructions (Signed)
Myomectomy Myomectomy is surgery to remove a noncancerous tumor (myoma) from the uterus. Myomas are tumors made up of fibrous tissue. They are often called fibroid tumors. Fibroid tumors can range from the size of a pea to the size of a grapefruit. In a myomectomy, the fibroid tumor is removed without removing the uterus. Because these tumors are rarely cancerous, this surgery is usually done only if the tumor is growing or causing symptoms such as pain, pressure, bleeding, or pain with intercourse. LET YOUR HEALTH CARE PROVIDER KNOW ABOUT:  Any allergies you have.  All medicines you are taking, including vitamins, herbs, eye drops, creams, and over-the-counter medicines.  Previous problems you or members of your family have had with the use of anesthetics.  Any blood disorders you have.  Previous surgeries you have had.  Medical conditions you have. RISKS AND COMPLICATIONS Generally, this is a safe procedure. However, as with any procedure, complications can occur. Possible complications include:  Excessive bleeding.  Infection.  Injury to nearby organs.  Blood clots in the legs, chest, or brain.  Scar tissue on other organs and in the pelvis. This may require another surgery to remove the scar tissue.  BEFORE THE PROCEDURE  Ask your health care provider about changing or stopping your regular medicines. Avoid taking aspirin or blood thinners as directed by your health care provider.  Do not  eat or drink anything after midnight on the night before surgery.  If you smoke, do not  smoke for 2 weeks before the surgery.  Do not  drink alcohol the day before the surgery.  Arrange for someone to drive you home after the procedure or after your hospital stay. Also arrange for someone to help you with activities during your recovery. PROCEDURE You will be given medicine to make you sleep through the procedure (general anesthetic). Any of the following methods may be used to perform  a myomectomy:  Small monitors will be put on your body. They are used to check your heart, blood pressure, and oxygen level.  An IV access tube will be put into one of your veins. Medicine will be able to flow directly into your body through this IV tube.  You might be given a medicine to help you relax (sedative).  You will be given a medicine to make you sleep (general anesthetic). A breathing tube will be placed into your lungs during the procedure.  A thin, flexible tube (catheter) will be inserted into your bladder to collect urine.  Any of the following methods may be used to perform a myomectomy: ? Hysteroscopic myomectomy-This method may be used when the fibroid tumor is inside the cavity of the uterus. A long, thin tube that is like a telescope (hysteroscope) is inserted inside the uterus. A saline solution is put into your uterus. This expands the uterus and allows the surgeon to see the fibroids. Tools are passed through the hysteroscope to remove the fibroid tumor in pieces. ? Laparoscopic myomectomy-A few small cuts (incisions) are made in the lower abdomen. A thin, lighted tube with a tiny camera on the end (laparoscope) is inserted through one of the incisions. This gives the surgeon a good view of the area. The fibroid tumor is removed through the other incisions. The incisions are then closed with stitches (sutures) or staples. ? Abdominal myomectomy-This method is used when the fibroid tumor cannot be removed with a hysteroscope or laparoscope. The surgery is performed through a larger surgical incision in the abdomen. The   fibroid tumor is removed through this incision. The incision is closed with sutures or staples.  What to expect after the procedure  If you had a laparoscopic or hysteroscopic myomectomy, you may be able to go home the same day, or you may need to stay in the hospital overnight.  If you had an abdominal myomectomy, you may need to stay in the hospital for a  few days.  Your IV access tube and catheter will be removed in 1-2 days.  You may be given medicine for pain or to help you sleep.  You may be given an antibiotic medicine, if needed. This information is not intended to replace advice given to you by your health care provider. Make sure you discuss any questions you have with your health care provider. Document Released: 05/13/2007 Document Revised: 12/22/2015 Document Reviewed: 02/25/2013 Elsevier Interactive Patient Education  2017 Elsevier Inc.  

## 2017-12-09 NOTE — Progress Notes (Signed)
  HPI: Uterine Fibroids Patient presents with uterine fibroids. Periods are regular every 28-30 days, lasting several days. Dysmenorrhea:severe, occurring throughout menses. Cyclic symptoms include none. No intermenstrual bleeding, spotting, or discharge.  Ultrasound demonstrates 5-6 cm fibroid (IM to SM) These findings are Pelvis abnormal - fibroid  PMHx: She  has a past medical history of Anxiety, Asthma, Chronic anemia, Depression, Diabetes ([place]), and GERD (gastroesophageal reflux disease). Also,  has a past surgical history that includes Tubal ligation and Esophagogastroduodenoscopy (egd) with propofol (N/A, 05/18/2016)., family history includes Anxiety disorder in her mother; Asthma in her mother; Fibroids in her mother; GER disease in her father.,  reports that she has been smoking cigarettes.  She has been smoking about 0.25 packs per day. She has never used smokeless tobacco. She reports that she drinks alcohol. She reports that she does not use drugs.  She has a current medication list which includes the following prescription(s): albuterol, ferrous sulfate, lamotrigine, azithromycin, cholecalciferol, cholecalciferol, docusate sodium, escitalopram, levocetirizine, lorazepam, methylprednisolone, montelukast, multi-vitamins, pantoprazole, and pantoprazole. Also, is allergic to oxycodone-acetaminophen; percocet [oxycodone-acetaminophen]; and phenylephrine-guaifenesin.  Review of Systems  All other systems reviewed and are negative.   Objective: BP 130/80   Ht 5' (1.524 m)   Wt 182 lb (82.6 kg)   LMP 11/11/2017 (Approximate)   BMI 35.54 kg/m   Physical examination Constitutional NAD, Conversant  Skin No rashes, lesions or ulceration.   Extremities: Moves all appropriately.  Normal ROM for age. No lymphadenopathy.  Neuro: Grossly intact  Psych: Oriented to PPT.  Normal mood. Normal affect.   [place]-  Indications:menorrhagia with Anemia Findings:  The uterus is anteverted and  measures 10.59 x 6.47 x 7.57cm. Echo texture is homogenous with evidence of a focal mass. Within the uterus is a single suspected fibroid measuring: Fibroid 1: 5.32 x 5.26cm, Mid Body, Intramural with Submucousal extension  The Endometrium measures 6.03 mm.  Right Ovary is not identified. Left Ovary measures 3.0 x 2.42 x 2.11 cm. It is normal in appearance. Survey of the adnexa demonstrates no adnexal masses. There is no free fluid in the cul de sac.  Impression: 1. Single fibroid within the uterus  Assessment:  Fibroid  Menorrhagia with regular cycle  Blood loss anemia  Fibroid treatment such as [place], Lupron, Myomectomy, and Hysterectomy discussed in detail, with the pros and cons of each choice counseled.  No treatment as an option also discussed, as well as control of symptoms alone with hormone therapy. Information provided to the patient.  Desires myomectomy to preserve option for fertility and does not want hysterectomy.  Prefers not to have [place] done.  A total of 15 minutes were spent face-to-face with the patient during this encounter and over half of that time dealt with counseling and coordination of care.  Barnett Applebaum, MD, [facility], [facility] 12/09/2017  4:09 PM

## 2017-12-10 ENCOUNTER — Telehealth: Payer: Self-pay | Admitting: Obstetrics & Gynecology

## 2017-12-10 NOTE — Telephone Encounter (Signed)
Lmtrc

## 2017-12-10 NOTE — Telephone Encounter (Signed)
Patient returned the call and is aware of H&P at [facility] on 12/25/17 @ 2:10pm w/ Dr. Kenton Kingfisher, Pre-admit Testing phone interview to be scheduled, and OR on 01/02/18. Patient is aware she may receive calls from the [facility] and [facility]. Patient said she may possibly be prescribed another anxiety medication and is aware she should let the pharmacy know every medication she is taking when they call. Patient has my ext.

## 2017-12-10 NOTE — Telephone Encounter (Signed)
OK. Thx

## 2017-12-10 NOTE — Telephone Encounter (Signed)
-----   Message from Gae Dry, MD sent at 12/09/2017  4:11 PM EDT ----- Regarding: surgery Surgery Booking Request Patient Full Name:  Lori Bentley  MRN: [id number]  DOB: 1986-07-16  Surgeon: [name], MD  Requested Surgery Date and Time: 12/24/17, start time 830 due to meeting at 7 Primary Diagnosis AND Code: Fibroid, Menorrhagia, Anemia Secondary Diagnosis and Code:  Surgical Procedure: Open Myomectomy L&D Notification: No Admission Status: surgery admit Length of Surgery: 1 hour Special Case Needs: vasopressin H&P:  yes (date) Phone Interview???: yes Interpreter: Language:  Medical Clearance: no Special Scheduling Instructions: no

## 2017-12-25 ENCOUNTER — Ambulatory Visit (INDEPENDENT_AMBULATORY_CARE_PROVIDER_SITE_OTHER): Payer: Medicaid Other | Admitting: Obstetrics & Gynecology

## 2017-12-25 ENCOUNTER — Encounter: Payer: Self-pay | Admitting: Obstetrics & Gynecology

## 2017-12-25 VITALS — BP 100/60 | Ht 60.0 in | Wt 178.0 lb

## 2017-12-25 DIAGNOSIS — D219 Benign neoplasm of connective and other soft tissue, unspecified: Secondary | ICD-10-CM | POA: Diagnosis not present

## 2017-12-25 DIAGNOSIS — N920 Excessive and frequent menstruation with regular cycle: Secondary | ICD-10-CM

## 2017-12-25 DIAGNOSIS — D500 Iron deficiency anemia secondary to blood loss (chronic): Secondary | ICD-10-CM

## 2017-12-25 NOTE — H&P (View-Only) (Signed)
PRE-OPERATIVE HISTORY AND PHYSICAL EXAM  HPI:  Lori Bentley is a 32 y.o. [id number] No LMP recorded.; she is being admitted for surgery related to abnormal uterine bleeding and fibroids. Periods are regular every 28-30 days, lasting several days. Dysmenorrhea:severe, occurring throughout menses. Cyclic symptoms include none. No intermenstrual bleeding, spotting, or discharge.  Ultrasound demonstrates 5-6 cm fibroid (IM to SM)  PMHx: Past Medical History:  Diagnosis Date  . Anxiety   . Asthma   . Chronic anemia   . Depression   . Diabetes ([place])   . GERD (gastroesophageal reflux disease)    Past Surgical History:  Procedure Laterality Date  . ESOPHAGOGASTRODUODENOSCOPY (EGD) WITH PROPOFOL N/A 05/18/2016   Procedure: ESOPHAGOGASTRODUODENOSCOPY (EGD) WITH PROPOFOL;  Surgeon: Manya Silvas, MD;  Location: [facility] ENDOSCOPY;  Service: Endoscopy;  Laterality: N/A;  . TUBAL LIGATION     Family History  Problem Relation Age of Onset  . Anxiety disorder Mother   . Asthma Mother   . Fibroids Mother   . GER disease Father    Social History   Tobacco Use  . Smoking status: Current Some Day Smoker    Packs/day: 0.25    Types: Cigarettes  . Smokeless tobacco: Never Used  Substance Use Topics  . Alcohol use: Yes  . Drug use: No    Current Outpatient Medications:  .  albuterol (PROVENTIL HFA;VENTOLIN HFA) 108 (90 Base) MCG/ACT inhaler, Inhale 2 puffs into the lungs every 6 (six) hours as needed for wheezing or shortness of breath., Disp: 1 Inhaler, Rfl: 0 .  azithromycin (ZITHROMAX Z-PAK) 250 MG tablet, 2 pills today then 1 pill a day for 4 days (Patient not taking: Reported on 12/02/2017), Disp: 6 each, Rfl: 0 .  escitalopram (LEXAPRO) 20 MG tablet, Take 20 mg by mouth daily., Disp: , Rfl:  .  Iron-Vitamin C 65-125 MG TABS, Take 1 tablet by mouth daily., Disp: , Rfl:  .  lamoTRIgine (LAMICTAL) 25 MG tablet, Take 25 mg by mouth daily., Disp: , Rfl:  .  LORazepam (ATIVAN) 1 MG  tablet, Take 1 tablet (1 mg total) by mouth 2 (two) times daily as needed for anxiety or sleep. (Patient not taking: Reported on 12/09/2017), Disp: 5 tablet, Rfl: 0 .  methylPREDNISolone (MEDROL DOSEPAK) 4 MG TBPK tablet, Take 6 pills on day one then decrease by 1 pill each day (Patient not taking: Reported on 12/02/2017), Disp: 21 tablet, Rfl: 0 .  pantoprazole (PROTONIX) 40 MG tablet, Take 1 tablet (40 mg total) by mouth 2 (two) times daily., Disp: 60 tablet, Rfl: 0 Allergies: Percocet [oxycodone-acetaminophen] and Phenylephrine-guaifenesin  Review of Systems  Constitutional: Negative for chills, fever and malaise/fatigue.  HENT: Negative for congestion, sinus pain and sore throat.   Eyes: Negative for blurred vision and pain.  Respiratory: Negative for cough and wheezing.   Cardiovascular: Negative for chest pain and leg swelling.  Gastrointestinal: Negative for abdominal pain, constipation, diarrhea, heartburn, nausea and vomiting.  Genitourinary: Negative for dysuria, frequency, hematuria and urgency.  Musculoskeletal: Negative for back pain, joint pain, myalgias and neck pain.  Skin: Negative for itching and rash.  Neurological: Negative for dizziness, tremors and weakness.  Endo/Heme/Allergies: Does not bruise/bleed easily.  Psychiatric/Behavioral: Negative for depression. The patient is not nervous/anxious and does not have insomnia.     Objective: There were no vitals taken for this visit. There were no vitals filed for this visit. Physical Exam  Constitutional: She is oriented to person, place, and time. She appears  well-developed and well-nourished. No distress.  HENT:  Head: Normocephalic and atraumatic. Head is without laceration.  Right Ear: Hearing normal.  Left Ear: Hearing normal.  Nose: No epistaxis.  No foreign bodies.  Mouth/Throat: Uvula is midline, oropharynx is clear and moist and mucous membranes are normal.  Eyes: Pupils are equal, round, and reactive to light.    Neck: Normal range of motion. Neck supple. No thyromegaly present.  Cardiovascular: Normal rate and regular rhythm. Exam reveals no gallop and no friction rub.  No murmur heard. Pulmonary/Chest: Effort normal and breath sounds normal. No respiratory distress. She has no wheezes. Right breast exhibits no mass, no skin change and no tenderness. Left breast exhibits no mass, no skin change and no tenderness.  Abdominal: Soft. Bowel sounds are normal. She exhibits no distension. There is no tenderness. There is no rebound.  Musculoskeletal: Normal range of motion.  Neurological: She is alert and oriented to person, place, and time. No cranial nerve deficit.  Skin: Skin is warm and dry.  Psychiatric: She has a normal mood and affect. Judgment normal.  Vitals reviewed.   Assessment: 1. Menorrhagia with regular cycle   2. Fibroid   3. Blood loss anemia   All options discussed; prefers open myomectomy for removal of fibroid(s) and preservation of fertility options.  I have had a careful discussion with this patient about all the options available and the risk/benefits of each. I have fully informed this patient that surgery may subject her to a variety of discomforts and risks: She understands that most patients have surgery with little difficulty, but problems can happen ranging from minor to fatal. These include nausea, vomiting, pain, bleeding, infection, poor healing, hernia, or formation of adhesions. Unexpected reactions may occur from any drug or anesthetic given. Unintended injury may occur to other pelvic or abdominal structures such as Fallopian tubes, ovaries, bladder, ureter (tube from kidney to bladder), or bowel. Nerves going from the pelvis to the legs may be injured. Any such injury may require immediate or later additional surgery to correct the problem. Excessive blood loss requiring transfusion is very unlikely but possible. Dangerous blood clots may form in the legs or lungs. Physical  and sexual activity will be restricted in varying degrees for an indeterminate period of time but most often 2-6 weeks.  Finally, she understands that it is impossible to list every possible undesirable effect and that the condition for which surgery is done is not always cured or significantly improved, and in rare cases may be even worse.Ample time was given to answer all questions.  Barnett Applebaum, MD, [facility], [facility] 12/25/2017  8:09 AM

## 2017-12-25 NOTE — Progress Notes (Signed)
PRE-OPERATIVE HISTORY AND PHYSICAL EXAM  HPI:  Lori Bentley is a 32 y.o. [id number] No LMP recorded.; she is being admitted for surgery related to abnormal uterine bleeding and fibroids. Periods are regular every 28-30 days, lasting several days. Dysmenorrhea:severe, occurring throughout menses. Cyclic symptoms include none. No intermenstrual bleeding, spotting, or discharge.  Ultrasound demonstrates 5-6 cm fibroid (IM to SM)  PMHx: Past Medical History:  Diagnosis Date  . Anxiety   . Asthma   . Chronic anemia   . Depression   . Diabetes ([place])   . GERD (gastroesophageal reflux disease)    Past Surgical History:  Procedure Laterality Date  . ESOPHAGOGASTRODUODENOSCOPY (EGD) WITH PROPOFOL N/A 05/18/2016   Procedure: ESOPHAGOGASTRODUODENOSCOPY (EGD) WITH PROPOFOL;  Surgeon: Manya Silvas, MD;  Location: [facility] ENDOSCOPY;  Service: Endoscopy;  Laterality: N/A;  . TUBAL LIGATION     Family History  Problem Relation Age of Onset  . Anxiety disorder Mother   . Asthma Mother   . Fibroids Mother   . GER disease Father    Social History   Tobacco Use  . Smoking status: Current Some Day Smoker    Packs/day: 0.25    Types: Cigarettes  . Smokeless tobacco: Never Used  Substance Use Topics  . Alcohol use: Yes  . Drug use: No    Current Outpatient Medications:  .  albuterol (PROVENTIL HFA;VENTOLIN HFA) 108 (90 Base) MCG/ACT inhaler, Inhale 2 puffs into the lungs every 6 (six) hours as needed for wheezing or shortness of breath., Disp: 1 Inhaler, Rfl: 0 .  azithromycin (ZITHROMAX Z-PAK) 250 MG tablet, 2 pills today then 1 pill a day for 4 days (Patient not taking: Reported on 12/02/2017), Disp: 6 each, Rfl: 0 .  escitalopram (LEXAPRO) 20 MG tablet, Take 20 mg by mouth daily., Disp: , Rfl:  .  Iron-Vitamin C 65-125 MG TABS, Take 1 tablet by mouth daily., Disp: , Rfl:  .  lamoTRIgine (LAMICTAL) 25 MG tablet, Take 25 mg by mouth daily., Disp: , Rfl:  .  LORazepam (ATIVAN) 1 MG  tablet, Take 1 tablet (1 mg total) by mouth 2 (two) times daily as needed for anxiety or sleep. (Patient not taking: Reported on 12/09/2017), Disp: 5 tablet, Rfl: 0 .  methylPREDNISolone (MEDROL DOSEPAK) 4 MG TBPK tablet, Take 6 pills on day one then decrease by 1 pill each day (Patient not taking: Reported on 12/02/2017), Disp: 21 tablet, Rfl: 0 .  pantoprazole (PROTONIX) 40 MG tablet, Take 1 tablet (40 mg total) by mouth 2 (two) times daily., Disp: 60 tablet, Rfl: 0 Allergies: Percocet [oxycodone-acetaminophen] and Phenylephrine-guaifenesin  Review of Systems  Constitutional: Negative for chills, fever and malaise/fatigue.  HENT: Negative for congestion, sinus pain and sore throat.   Eyes: Negative for blurred vision and pain.  Respiratory: Negative for cough and wheezing.   Cardiovascular: Negative for chest pain and leg swelling.  Gastrointestinal: Negative for abdominal pain, constipation, diarrhea, heartburn, nausea and vomiting.  Genitourinary: Negative for dysuria, frequency, hematuria and urgency.  Musculoskeletal: Negative for back pain, joint pain, myalgias and neck pain.  Skin: Negative for itching and rash.  Neurological: Negative for dizziness, tremors and weakness.  Endo/Heme/Allergies: Does not bruise/bleed easily.  Psychiatric/Behavioral: Negative for depression. The patient is not nervous/anxious and does not have insomnia.     Objective: There were no vitals taken for this visit. There were no vitals filed for this visit. Physical Exam  Constitutional: She is oriented to person, place, and time. She appears  well-developed and well-nourished. No distress.  HENT:  Head: Normocephalic and atraumatic. Head is without laceration.  Right Ear: Hearing normal.  Left Ear: Hearing normal.  Nose: No epistaxis.  No foreign bodies.  Mouth/Throat: Uvula is midline, oropharynx is clear and moist and mucous membranes are normal.  Eyes: Pupils are equal, round, and reactive to light.    Neck: Normal range of motion. Neck supple. No thyromegaly present.  Cardiovascular: Normal rate and regular rhythm. Exam reveals no gallop and no friction rub.  No murmur heard. Pulmonary/Chest: Effort normal and breath sounds normal. No respiratory distress. She has no wheezes. Right breast exhibits no mass, no skin change and no tenderness. Left breast exhibits no mass, no skin change and no tenderness.  Abdominal: Soft. Bowel sounds are normal. She exhibits no distension. There is no tenderness. There is no rebound.  Musculoskeletal: Normal range of motion.  Neurological: She is alert and oriented to person, place, and time. No cranial nerve deficit.  Skin: Skin is warm and dry.  Psychiatric: She has a normal mood and affect. Judgment normal.  Vitals reviewed.   Assessment: 1. Menorrhagia with regular cycle   2. Fibroid   3. Blood loss anemia   All options discussed; prefers open myomectomy for removal of fibroid(s) and preservation of fertility options.  I have had a careful discussion with this patient about all the options available and the risk/benefits of each. I have fully informed this patient that surgery may subject her to a variety of discomforts and risks: She understands that most patients have surgery with little difficulty, but problems can happen ranging from minor to fatal. These include nausea, vomiting, pain, bleeding, infection, poor healing, hernia, or formation of adhesions. Unexpected reactions may occur from any drug or anesthetic given. Unintended injury may occur to other pelvic or abdominal structures such as Fallopian tubes, ovaries, bladder, ureter (tube from kidney to bladder), or bowel. Nerves going from the pelvis to the legs may be injured. Any such injury may require immediate or later additional surgery to correct the problem. Excessive blood loss requiring transfusion is very unlikely but possible. Dangerous blood clots may form in the legs or lungs. Physical  and sexual activity will be restricted in varying degrees for an indeterminate period of time but most often 2-6 weeks.  Finally, she understands that it is impossible to list every possible undesirable effect and that the condition for which surgery is done is not always cured or significantly improved, and in rare cases may be even worse.Ample time was given to answer all questions.  Barnett Applebaum, MD, [facility], [facility] 12/25/2017  8:09 AM

## 2017-12-25 NOTE — Patient Instructions (Signed)
Myomectomy, Care After °Refer to this sheet in the next few weeks. These instructions provide you with information on caring for yourself after your procedure. Your health care provider may also give you more specific instructions. Your treatment has been planned according to current medical practices, but problems sometimes occur. Call your health care provider if you have any problems or questions after your procedure. °What can I expect after the procedure? °After your procedure, it is typical to have the following: °· Pain in your abdomen, especially at any incision sites. You will be given pain medicine to control the pain. °· Tiredness. This is a normal part of the recovery process. Your energy level will return to normal over the next several weeks. °· Constipation. °· Vaginal bleeding. This is normal and should stop after 1-2 weeks. ° °Follow these instructions at home: °· Only take over-the-counter or prescription medicines as directed by your health care provider. Avoid aspirin because it can cause bleeding. °· Do not douche, use tampons, or have sexual intercourse until given permission by your health care provider. °· Remove or change any bandages (dressings) as directed by your health care provider. °· Take showers instead of baths as directed by your health care provider. °· You will probably be able to go back to your normal routine after a few days. Do not do anything that requires extra effort until your health care provider says it is okay. Do not lift anything heavier than 15 pounds (6.8 kg) until your health care provider approves. °· Walk daily but take frequent rest breaks if you tire easily. °· Continue to practice deep breathing and coughing. If it hurts to cough, try holding a pillow against your belly as you cough. °· If you become constipated, you may: °? Use a mild laxative if your health care provider approves. °? Add more fruit and bran to your diet. °? Drink enough fluids to keep your  urine clear or pale yellow. °· Take your temperature twice a day and write it down. °· Do not drink alcohol. °· Do not drive until your health care provider approves. °· Have someone help you at home for 1 week or until you can do your own household activities. °· Follow up with your health care provider as directed. °Contact a health care provider if: °· You have a fever. °· You have increasing abdominal pain that is not relieved with medicine. °· You have nausea, vomiting, or diarrhea. °· You have pain when you urinate, or you have blood in your urine. °· You have a rash on your body. °· You have pain or redness where your IV access tube was inserted. °· You have redness, swelling, or any kind of drainage from an incision. °Get help right away if: °· You have weakness or lightheadedness. °· You have pain, swelling, or redness in your legs. °· You have chest pain. °· You faint. °· You have shortness of breath. °· You have heavy vaginal bleeding. °· Your incision is opening up. °This information is not intended to replace advice given to you by your health care provider. Make sure you discuss any questions you have with your health care provider. °Document Released: 12/06/2010 Document Revised: 12/22/2015 Document Reviewed: 02/25/2013 °Elsevier Interactive Patient Education © 2017 [employer] ° °

## 2017-12-26 ENCOUNTER — Encounter
Admission: RE | Admit: 2017-12-26 | Discharge: 2017-12-26 | Disposition: A | Discharge status: Home / Self Care | Payer: Medicaid Other | Source: Ambulatory Visit | Attending: Obstetrics & Gynecology | Admitting: Obstetrics & Gynecology

## 2017-12-26 ENCOUNTER — Other Ambulatory Visit: Payer: Self-pay

## 2017-12-26 HISTORY — DX: Personal history of other medical treatment: Z92.89

## 2017-12-26 HISTORY — DX: Personal history of other diseases of the digestive system: Z87.19

## 2017-12-26 NOTE — Patient Instructions (Addendum)
Your procedure is scheduled on: 01-02-18 Report to Same Day Surgery 2nd floor medical mall [facility] Entrance-take elevator on left to 2nd floor.  Check in with surgery information desk.) To find out your arrival time please call [phone] between 1PM - 3PM on   Remember: Instructions that are not followed completely may result in serious medical risk, up to and including death, or upon the discretion of your surgeon and anesthesiologist your surgery may need to be rescheduled.    _x___ 1. Do not eat food after midnight the night before your procedure. You may drink clear liquids up to 2 hours before you are scheduled to arrive at the hospital for your procedure.  Do not drink clear liquids within 2 hours of your scheduled arrival to the hospital.  Clear liquids include  --Water or Apple juice without pulp  --Clear carbohydrate beverage such as ClearFast or Gatorade  --Black Coffee or Clear Tea (No milk, no creamers, do not add anything to                  the coffee or Tea Type 1 and type 2 diabetics should only drink water.  No gum chewing or hard candies.     __x__ 2. No Alcohol for 24 hours before or after surgery.   __x__3. No Smoking or e-cigarettes for 24 prior to surgery.  Do not use any chewable tobacco products for at least 6 hour prior to surgery   ____  4. Bring all medications with you on the day of surgery if instructed.    __x__ 5. Notify your doctor if there is any change in your medical condition     (cold, fever, infections).    x___6. On the morning of surgery brush your teeth with toothpaste and water.  You may rinse your mouth with mouth wash if you wish.  Do not swallow any toothpaste or mouthwash.   Do not wear jewelry, make-up, hairpins, clips or nail polish.  Do not wear lotions, powders, or perfumes. You may wear deodorant.  Do not shave 48 hours prior to surgery. Men may shave face and neck.  Do not bring valuables to the hospital.    [facility] is  not responsible for any belongings or valuables.               Contacts, dentures or bridgework may not be worn into surgery.  Leave your suitcase in the car. After surgery it may be brought to your room.  For patients admitted to the hospital, discharge time is determined by your                       treatment team.  _  Patients discharged the day of surgery will not be allowed to drive home.  You will need someone to drive you home and stay with you the night of your procedure.    Please read over the following fact sheets that you were given:   [facility] Preparing for Surgery and or MRSA Information   _x___ TAKE THE FOLLOWING MEDICATIONS THE MORNING OF SURGERY WITH A SMALL SIP OF WATER. These include:  1. LEXAPRO  2. LAMICTAL  3. PROTONIX  4.  5.  6.  ____Fleets enema or Magnesium Citrate as directed.   _x___ Use CHG Soap or sage wipes as directed on instruction sheet   __X__ Use inhalers on the day of surgery and bring to hospital day of surgery-USE ALBUTEROL INHALER  AND BRING TO HOSPITAL  ____ Stop Metformin and Janumet 2 days prior to surgery.    ____ Take 1/2 of usual insulin dose the night before surgery and none on the morning     surgery.   ____ Follow recommendations from Cardiologist, Pulmonologist or PCP regarding stopping Aspirin, Coumadin, Plavix ,Eliquis, Effient, or Pradaxa, and Pletal.  X____Stop Anti-inflammatories such as Advil, Aleve, Ibuprofen, Motrin, Naproxen, Naprosyn, Goodies powders or aspirin products NOW-OK to take Tylenol    ____ Stop supplements until after surgery.    ____ Bring C-Pap to the hospital.

## 2017-12-28 DIAGNOSIS — E119 Type 2 diabetes mellitus without complications: Secondary | ICD-10-CM

## 2017-12-28 HISTORY — DX: Type 2 diabetes mellitus without complications: E11.9

## 2017-12-30 ENCOUNTER — Encounter
Admission: RE | Admit: 2017-12-30 | Discharge: 2017-12-30 | Disposition: A | Discharge status: Home / Self Care | Payer: Medicaid Other | Source: Ambulatory Visit | Attending: Obstetrics & Gynecology | Admitting: Obstetrics & Gynecology

## 2017-12-30 DIAGNOSIS — Z01818 Encounter for other preprocedural examination: Secondary | ICD-10-CM | POA: Diagnosis not present

## 2017-12-30 DIAGNOSIS — D500 Iron deficiency anemia secondary to blood loss (chronic): Secondary | ICD-10-CM | POA: Diagnosis not present

## 2017-12-30 DIAGNOSIS — D219 Benign neoplasm of connective and other soft tissue, unspecified: Secondary | ICD-10-CM | POA: Diagnosis not present

## 2017-12-30 DIAGNOSIS — N920 Excessive and frequent menstruation with regular cycle: Secondary | ICD-10-CM | POA: Diagnosis not present

## 2017-12-30 LAB — TYPE AND SCREEN
ABO/RH(D): B NEG
ANTIBODY SCREEN: NEGATIVE

## 2017-12-30 LAB — CBC
HEMATOCRIT: 33.2 % — AB (ref 35.0–47.0)
HEMOGLOBIN: 10.5 g/dL — AB (ref 12.0–16.0)
MCH: 23.4 pg — ABNORMAL LOW (ref 26.0–34.0)
MCHC: 31.7 g/dL — AB (ref 32.0–36.0)
MCV: 73.9 fL — AB (ref 80.0–100.0)
Platelets: 581 10*3/uL — ABNORMAL HIGH (ref 150–440)
RBC: 4.49 MIL/uL (ref 3.80–5.20)
RDW: 16.8 % — ABNORMAL HIGH (ref 11.5–14.5)
WBC: 8.7 10*3/uL (ref 3.6–11.0)

## 2018-01-01 MED ORDER — CEFOXITIN SODIUM-DEXTROSE 2-2.2 GM-%(50ML) IV SOLR
2.0000 g | INTRAVENOUS | Status: AC
  Administered 2018-01-02: 2 g via INTRAVENOUS

## 2018-01-02 ENCOUNTER — Encounter: Payer: Self-pay | Admitting: Certified Registered"

## 2018-01-02 ENCOUNTER — Inpatient Hospital Stay: Payer: Medicaid Other | Admitting: Anesthesiology

## 2018-01-02 ENCOUNTER — Inpatient Hospital Stay
Admission: RE | Admit: 2018-01-02 | Discharge: 2018-01-03 | DRG: 743 | Disposition: A | Discharge status: Home / Self Care | Payer: Medicaid Other | Attending: Obstetrics & Gynecology | Admitting: Obstetrics & Gynecology

## 2018-01-02 ENCOUNTER — Encounter
Admission: RE | Disposition: A | Discharge status: Home / Self Care | Payer: Self-pay | Source: Home / Self Care | Attending: Obstetrics & Gynecology

## 2018-01-02 DIAGNOSIS — N920 Excessive and frequent menstruation with regular cycle: Secondary | ICD-10-CM | POA: Diagnosis not present

## 2018-01-02 DIAGNOSIS — K219 Gastro-esophageal reflux disease without esophagitis: Secondary | ICD-10-CM | POA: Diagnosis present

## 2018-01-02 DIAGNOSIS — N946 Dysmenorrhea, unspecified: Secondary | ICD-10-CM | POA: Diagnosis present

## 2018-01-02 DIAGNOSIS — J45909 Unspecified asthma, uncomplicated: Secondary | ICD-10-CM | POA: Diagnosis present

## 2018-01-02 DIAGNOSIS — D500 Iron deficiency anemia secondary to blood loss (chronic): Secondary | ICD-10-CM | POA: Diagnosis present

## 2018-01-02 DIAGNOSIS — D259 Leiomyoma of uterus, unspecified: Principal | ICD-10-CM | POA: Diagnosis present

## 2018-01-02 DIAGNOSIS — F329 Major depressive disorder, single episode, unspecified: Secondary | ICD-10-CM | POA: Diagnosis present

## 2018-01-02 DIAGNOSIS — D219 Benign neoplasm of connective and other soft tissue, unspecified: Secondary | ICD-10-CM | POA: Diagnosis present

## 2018-01-02 DIAGNOSIS — F17210 Nicotine dependence, cigarettes, uncomplicated: Secondary | ICD-10-CM | POA: Diagnosis present

## 2018-01-02 HISTORY — PX: MYOMECTOMY: SHX85

## 2018-01-02 LAB — URINE DRUG SCREEN, QUALITATIVE (ARMC ONLY)
AMPHETAMINES, UR SCREEN: NOT DETECTED
Barbiturates, Ur Screen: NOT DETECTED
Benzodiazepine, Ur Scrn: NOT DETECTED
Cannabinoid 50 Ng, Ur ~~LOC~~: POSITIVE — AB
Cocaine Metabolite,Ur ~~LOC~~: NOT DETECTED
MDMA (ECSTASY) UR SCREEN: NOT DETECTED
METHADONE SCREEN, URINE: NOT DETECTED
Opiate, Ur Screen: NOT DETECTED
Phencyclidine (PCP) Ur S: NOT DETECTED
TRICYCLIC, UR SCREEN: NOT DETECTED

## 2018-01-02 LAB — POCT PREGNANCY, URINE: PREG TEST UR: NEGATIVE

## 2018-01-02 LAB — PREGNANCY, URINE: Preg Test, Ur: NEGATIVE

## 2018-01-02 SURGERY — MYOMECTOMY, ABDOMINAL APPROACH
Anesthesia: General | Wound class: Clean Contaminated

## 2018-01-02 MED ORDER — BUPIVACAINE 0.25 % ON-Q PUMP DUAL CATH 400 ML
400.0000 mL | INJECTION | Status: DC
  Filled 2018-01-02 (×2): qty 400

## 2018-01-02 MED ORDER — ROCURONIUM BROMIDE 50 MG/5ML IV SOLN
INTRAVENOUS | Status: AC
  Filled 2018-01-02: qty 1

## 2018-01-02 MED ORDER — HYDROCODONE-ACETAMINOPHEN 5-325 MG PO TABS
1.0000 | ORAL_TABLET | ORAL | Status: DC | PRN
  Administered 2018-01-03: 1 via ORAL
  Filled 2018-01-02: qty 1

## 2018-01-02 MED ORDER — LACTATED RINGERS IV SOLN
INTRAVENOUS | Status: DC
  Administered 2018-01-02 – 2018-01-03 (×3): via INTRAVENOUS

## 2018-01-02 MED ORDER — VASOPRESSIN 20 UNIT/ML IV SOLN
INTRAVENOUS | Status: AC
  Filled 2018-01-02: qty 1

## 2018-01-02 MED ORDER — ONDANSETRON HCL 4 MG/2ML IJ SOLN
INTRAMUSCULAR | Status: DC | PRN
  Administered 2018-01-02: 4 mg via INTRAVENOUS

## 2018-01-02 MED ORDER — FENTANYL CITRATE (PF) 250 MCG/5ML IJ SOLN
INTRAMUSCULAR | Status: AC
  Filled 2018-01-02: qty 5

## 2018-01-02 MED ORDER — MORPHINE SULFATE (PF) 2 MG/ML IV SOLN
1.0000 mg | INTRAVENOUS | Status: DC | PRN
  Administered 2018-01-02 – 2018-01-03 (×2): 2 mg via INTRAVENOUS
  Filled 2018-01-02 (×2): qty 1

## 2018-01-02 MED ORDER — BISACODYL 10 MG RE SUPP: 10.0000 mg | Freq: Every day | RECTAL | Status: DC | PRN

## 2018-01-02 MED ORDER — SODIUM CHLORIDE FLUSH 0.9 % IV SOLN
INTRAVENOUS | Status: AC
  Filled 2018-01-02: qty 50

## 2018-01-02 MED ORDER — SUGAMMADEX SODIUM 200 MG/2ML IV SOLN
INTRAVENOUS | Status: DC | PRN
  Administered 2018-01-02: 200 mg via INTRAVENOUS

## 2018-01-02 MED ORDER — PROPOFOL 10 MG/ML IV BOLUS
INTRAVENOUS | Status: DC | PRN
  Administered 2018-01-02: 50 mg via INTRAVENOUS
  Administered 2018-01-02: 150 mg via INTRAVENOUS

## 2018-01-02 MED ORDER — HYDROMORPHONE HCL 1 MG/ML IJ SOLN
INTRAMUSCULAR | Status: AC
  Filled 2018-01-02: qty 1

## 2018-01-02 MED ORDER — GLYCOPYRROLATE 0.2 MG/ML IJ SOLN
INTRAMUSCULAR | Status: DC | PRN
  Administered 2018-01-02: 0.1 mg via INTRAVENOUS

## 2018-01-02 MED ORDER — ONDANSETRON HCL 4 MG/2ML IJ SOLN: 4.0000 mg | Freq: Four times a day (QID) | INTRAMUSCULAR | Status: DC | PRN

## 2018-01-02 MED ORDER — VASOPRESSIN 20 UNIT/ML IV SOLN
INTRAVENOUS | Status: DC | PRN
  Administered 2018-01-02: 8 mL via INTRAMUSCULAR

## 2018-01-02 MED ORDER — ESCITALOPRAM OXALATE 10 MG PO TABS
20.0000 mg | ORAL_TABLET | ORAL | Status: DC
  Administered 2018-01-03: 20 mg via ORAL
  Filled 2018-01-02 (×2): qty 2

## 2018-01-02 MED ORDER — MIDAZOLAM HCL 2 MG/2ML IJ SOLN
INTRAMUSCULAR | Status: DC | PRN
  Administered 2018-01-02: 3 mg via INTRAVENOUS
  Administered 2018-01-02: 2 mg via INTRAVENOUS

## 2018-01-02 MED ORDER — GLYCOPYRROLATE 0.2 MG/ML IJ SOLN
INTRAMUSCULAR | Status: AC
  Filled 2018-01-02: qty 1

## 2018-01-02 MED ORDER — LAMOTRIGINE 25 MG PO TABS
25.0000 mg | ORAL_TABLET | ORAL | Status: DC
  Administered 2018-01-03: 25 mg via ORAL
  Filled 2018-01-02 (×2): qty 1

## 2018-01-02 MED ORDER — FENTANYL CITRATE (PF) 100 MCG/2ML IJ SOLN
25.0000 ug | INTRAMUSCULAR | Status: DC | PRN
  Administered 2018-01-02: 25 ug via INTRAVENOUS

## 2018-01-02 MED ORDER — DEXAMETHASONE SODIUM PHOSPHATE 10 MG/ML IJ SOLN
INTRAMUSCULAR | Status: DC | PRN
  Administered 2018-01-02: 10 mg via INTRAVENOUS

## 2018-01-02 MED ORDER — LACTATED RINGERS IV SOLN: INTRAVENOUS | Status: DC

## 2018-01-02 MED ORDER — PANTOPRAZOLE SODIUM 40 MG PO TBEC
40.0000 mg | DELAYED_RELEASE_TABLET | Freq: Two times a day (BID) | ORAL | Status: DC
  Administered 2018-01-02 – 2018-01-03 (×2): 40 mg via ORAL
  Filled 2018-01-02 (×2): qty 1

## 2018-01-02 MED ORDER — ONDANSETRON HCL 4 MG/2ML IJ SOLN
INTRAMUSCULAR | Status: AC
  Filled 2018-01-02: qty 2

## 2018-01-02 MED ORDER — ACETAMINOPHEN NICU IV SYRINGE 10 MG/ML
INTRAVENOUS | Status: AC
  Filled 2018-01-02: qty 1

## 2018-01-02 MED ORDER — LIDOCAINE HCL (CARDIAC) PF 100 MG/5ML IV SOSY
PREFILLED_SYRINGE | INTRAVENOUS | Status: DC | PRN
  Administered 2018-01-02: 50 mg via INTRAVENOUS

## 2018-01-02 MED ORDER — FENTANYL CITRATE (PF) 100 MCG/2ML IJ SOLN
INTRAMUSCULAR | Status: DC | PRN
  Administered 2018-01-02: 150 ug via INTRAVENOUS
  Administered 2018-01-02: 100 ug via INTRAVENOUS

## 2018-01-02 MED ORDER — DIPHENHYDRAMINE HCL 50 MG/ML IJ SOLN
INTRAMUSCULAR | Status: DC | PRN
  Administered 2018-01-02: 12.5 mg via INTRAVENOUS

## 2018-01-02 MED ORDER — MIDAZOLAM HCL 5 MG/5ML IJ SOLN
INTRAMUSCULAR | Status: AC
  Filled 2018-01-02: qty 5

## 2018-01-02 MED ORDER — FAMOTIDINE 20 MG PO TABS: 20.0000 mg | ORAL_TABLET | Freq: Once | ORAL | Status: DC

## 2018-01-02 MED ORDER — CEFOXITIN SODIUM-DEXTROSE 2-2.2 GM-%(50ML) IV SOLR
INTRAVENOUS | Status: AC
  Filled 2018-01-02: qty 50

## 2018-01-02 MED ORDER — SENNOSIDES-DOCUSATE SODIUM 8.6-50 MG PO TABS: 1.0000 | ORAL_TABLET | Freq: Every evening | ORAL | Status: DC | PRN

## 2018-01-02 MED ORDER — ONDANSETRON HCL 4 MG PO TABS: 4.0000 mg | ORAL_TABLET | Freq: Four times a day (QID) | ORAL | Status: DC | PRN

## 2018-01-02 MED ORDER — LACTATED RINGERS IV SOLN
INTRAVENOUS | Status: DC
  Administered 2018-01-02: 50 mL/h via INTRAVENOUS
  Administered 2018-01-02: 14:00:00 via INTRAVENOUS

## 2018-01-02 MED ORDER — BUPIVACAINE HCL 0.5 % IJ SOLN
INTRAMUSCULAR | Status: DC | PRN
  Administered 2018-01-02: 10 mL

## 2018-01-02 MED ORDER — ALBUTEROL SULFATE (2.5 MG/3ML) 0.083% IN NEBU: 2.5000 mg | INHALATION_SOLUTION | Freq: Four times a day (QID) | RESPIRATORY_TRACT | Status: DC | PRN

## 2018-01-02 MED ORDER — FENTANYL CITRATE (PF) 100 MCG/2ML IJ SOLN
INTRAMUSCULAR | Status: AC
  Filled 2018-01-02: qty 2

## 2018-01-02 MED ORDER — ROCURONIUM BROMIDE 100 MG/10ML IV SOLN
INTRAVENOUS | Status: DC | PRN
  Administered 2018-01-02: 50 mg via INTRAVENOUS

## 2018-01-02 MED ORDER — DOCUSATE SODIUM 100 MG PO CAPS
100.0000 mg | ORAL_CAPSULE | Freq: Two times a day (BID) | ORAL | Status: DC
  Administered 2018-01-02 – 2018-01-03 (×2): 100 mg via ORAL
  Filled 2018-01-02 (×2): qty 1

## 2018-01-02 MED ORDER — LIDOCAINE HCL (PF) 2 % IJ SOLN
INTRAMUSCULAR | Status: AC
  Filled 2018-01-02: qty 10

## 2018-01-02 MED ORDER — ACETAMINOPHEN 325 MG PO TABS
650.0000 mg | ORAL_TABLET | ORAL | Status: DC | PRN
  Administered 2018-01-03: 650 mg via ORAL
  Filled 2018-01-02: qty 2

## 2018-01-02 MED ORDER — BUPIVACAINE HCL 0.5 % IJ SOLN
10.0000 mL | Freq: Once | INTRAMUSCULAR | Status: DC
  Filled 2018-01-02: qty 10

## 2018-01-02 MED ORDER — HYDROMORPHONE HCL 1 MG/ML IJ SOLN: 0.2500 mg | INTRAMUSCULAR | Status: DC | PRN

## 2018-01-02 MED ORDER — SUGAMMADEX SODIUM 200 MG/2ML IV SOLN
INTRAVENOUS | Status: AC
  Filled 2018-01-02: qty 2

## 2018-01-02 MED ORDER — ACETAMINOPHEN 10 MG/ML IV SOLN
INTRAVENOUS | Status: DC | PRN
  Administered 2018-01-02: 1000 mg via INTRAVENOUS

## 2018-01-02 MED ORDER — BUPIVACAINE HCL (PF) 0.5 % IJ SOLN
INTRAMUSCULAR | Status: AC
  Filled 2018-01-02: qty 30

## 2018-01-02 MED ORDER — DEXAMETHASONE SODIUM PHOSPHATE 10 MG/ML IJ SOLN
INTRAMUSCULAR | Status: AC
  Filled 2018-01-02: qty 1

## 2018-01-02 MED ORDER — PROPOFOL 10 MG/ML IV BOLUS
INTRAVENOUS | Status: AC
  Filled 2018-01-02: qty 20

## 2018-01-02 MED ORDER — SIMETHICONE 80 MG PO CHEW
80.0000 mg | CHEWABLE_TABLET | Freq: Four times a day (QID) | ORAL | Status: DC | PRN
  Administered 2018-01-03: 80 mg via ORAL
  Filled 2018-01-02: qty 1

## 2018-01-02 SURGICAL SUPPLY — 42 items
BARRIER ADHS 3X4 INTERCEED (GAUZE/BANDAGES/DRESSINGS) ×2 IMPLANT
CANISTER SUCT 1200ML W/VALVE (MISCELLANEOUS) ×2 IMPLANT
CATH KIT ON-Q SILVERSOAK 5IN (CATHETERS) ×4 IMPLANT
CHLORAPREP W/TINT 26ML (MISCELLANEOUS) ×2 IMPLANT
COUNTER NEEDLE 20/40 LG (NEEDLE) ×2 IMPLANT
DRAPE LAPAROTOMY 100X77 ABD (DRAPES) IMPLANT
DRAPE LAPAROTOMY TRNSV 106X77 (MISCELLANEOUS) IMPLANT
DRSG OPSITE POSTOP 4X10 (GAUZE/BANDAGES/DRESSINGS) ×2 IMPLANT
DRSG TELFA 3X8 NADH (GAUZE/BANDAGES/DRESSINGS) IMPLANT
ELECT BLADE 6.5 EXT (BLADE) ×2 IMPLANT
ELECT CAUTERY BLADE 6.4 (BLADE) ×2 IMPLANT
ELECT REM PT RETURN 9FT ADLT (ELECTROSURGICAL) ×2
ELECTRODE REM PT RTRN 9FT ADLT (ELECTROSURGICAL) ×1 IMPLANT
GAUZE SPONGE 4X4 12PLY STRL (GAUZE/BANDAGES/DRESSINGS) IMPLANT
GLOVE BIO SURGEON STRL SZ8 (GLOVE) ×2 IMPLANT
GLOVE INDICATOR 8.0 STRL GRN (GLOVE) ×2 IMPLANT
GOWN STRL REUS W/ TWL LRG LVL3 (GOWN DISPOSABLE) ×2 IMPLANT
GOWN STRL REUS W/ TWL XL LVL3 (GOWN DISPOSABLE) ×1 IMPLANT
GOWN STRL REUS W/TWL LRG LVL3 (GOWN DISPOSABLE) ×2
GOWN STRL REUS W/TWL XL LVL3 (GOWN DISPOSABLE) ×1
NS IRRIG 1000ML POUR BTL (IV SOLUTION) ×2 IMPLANT
PACK BASIN MAJOR ARMC (MISCELLANEOUS) ×2 IMPLANT
PAD OB MATERNITY 4.3X12.25 (PERSONAL CARE ITEMS) ×2 IMPLANT
RETRACTOR WOUND ALXS 18CM MED (MISCELLANEOUS) ×1 IMPLANT
RTRCTR WOUND ALEXIS O 18CM MED (MISCELLANEOUS) ×2
SPONGE LAP 18X18 RF (DISPOSABLE) ×4 IMPLANT
STRAP SAFETY 5IN WIDE (MISCELLANEOUS) ×2 IMPLANT
STRIP CLOSURE SKIN 1/2X4 (GAUZE/BANDAGES/DRESSINGS) ×2 IMPLANT
SUT ETHIBOND 0 (SUTURE) ×2 IMPLANT
SUT MAXON ABS #0 GS21 30IN (SUTURE) ×4 IMPLANT
SUT PLAIN 2 0 XLH (SUTURE) ×2 IMPLANT
SUT VIC AB 0 CT1 27 (SUTURE) ×2
SUT VIC AB 0 CT1 27XCR 8 STRN (SUTURE) ×2 IMPLANT
SUT VIC AB 0 CT1 36 (SUTURE) ×4 IMPLANT
SUT VIC AB 2-0 SH 27 (SUTURE) ×5
SUT VIC AB 2-0 SH 27XBRD (SUTURE) ×5 IMPLANT
SUT VIC AB 4-0 PS2 18 (SUTURE) ×2 IMPLANT
SUT VICRYL PLUS ABS 0 54 (SUTURE) ×4 IMPLANT
SYR CONTROL 10ML (SYRINGE) ×2 IMPLANT
TOWEL OR 17X26 4PK STRL BLUE (TOWEL DISPOSABLE) ×2 IMPLANT
TRAY FOLEY MTR SLVR 16FR STAT (SET/KITS/TRAYS/PACK) ×2 IMPLANT
TRAY PREP VAG/GEN (MISCELLANEOUS) ×2 IMPLANT

## 2018-01-02 NOTE — Anesthesia Postprocedure Evaluation (Signed)
Anesthesia Post Note  Patient: Lori Bentley  Procedure(s) Performed: MYOMECTOMY (N/A )  Patient location during evaluation: PACU Anesthesia Type: General Level of consciousness: awake and alert Pain management: pain level controlled Vital Signs Assessment: post-procedure vital signs reviewed and stable Respiratory status: spontaneous breathing, nonlabored ventilation, respiratory function stable and patient connected to nasal cannula oxygen Cardiovascular status: blood pressure returned to baseline and stable Postop Assessment: no apparent nausea or vomiting Anesthetic complications: no     Last Vitals:  Vitals:   01/02/18 1553 01/02/18 1600  BP: 120/86 (!) 125/91  Pulse: 92 74  Resp: 18 20  Temp:  36.8 C  SpO2: 96% 97%    Last Pain:  Vitals:   01/02/18 1600  TempSrc: Oral  PainSc:                  Lori Bentley

## 2018-01-02 NOTE — Interval H&P Note (Signed)
History and Physical Interval Note:  01/02/2018 10:20 AM  Lori Bentley  has presented today for surgery, with the diagnosis of FIBROID MENORRHAGIA,ANEMIA  The various methods of treatment have been discussed with the patient and family. After consideration of risks, benefits and other options for treatment, the patient has consented to  Procedure(s): MYOMECTOMY (N/A) as a surgical intervention .  The patient's history has been reviewed, patient examined, no change in status, stable for surgery.  I have reviewed the patient's chart and labs.  Questions were answered to the patient's satisfaction.     [name]

## 2018-01-02 NOTE — Progress Notes (Signed)
Day of Surgery Procedure(s) (LRB): MYOMECTOMY (N/A)  Subjective: Patient reports incisional pain and otherwise doing well.    Objective: I have reviewed patient's vital signs, intake and output, medications and labs.  Abd: Min T, ND Incision: clean, dry and intact Extr: no calf T, no edema  Assessment: s/p Procedure(s): MYOMECTOMY (N/A): stable  Plan: Advance diet Encourage ambulation Advance to PO medication  LOS: 0 days    Lori Bentley 01/02/2018, 5:13 PM

## 2018-01-02 NOTE — Anesthesia Procedure Notes (Signed)
Procedure Name: Intubation Date/Time: 01/02/2018 1:04 PM Performed by: Nile Riggs, CRNA Pre-anesthesia Checklist: Patient identified, Suction available, Emergency Drugs available, Patient being monitored and Timeout performed Patient Re-evaluated:Patient Re-evaluated prior to induction Oxygen Delivery Method: Circle system utilized Preoxygenation: Pre-oxygenation with 100% oxygen Induction Type: IV induction Ventilation: Mask ventilation without difficulty Laryngoscope Size: Miller and 2 Grade View: Grade I Tube type: Oral Tube size: 7.5 mm Number of attempts: 1 Airway Equipment and Method: Stylet Placement Confirmation: ETT inserted through vocal cords under direct vision,  positive ETCO2,  CO2 detector and breath sounds checked- equal and bilateral Secured at: 22 cm Tube secured with: Tape Dental Injury: Teeth and Oropharynx as per pre-operative assessment

## 2018-01-02 NOTE — Transfer of Care (Signed)
Immediate Anesthesia Transfer of Care Note  Patient: Lori Bentley  Procedure(s) Performed: MYOMECTOMY (N/A )  Patient Location: PACU  Anesthesia Type:General  Level of Consciousness: drowsy and patient cooperative  Airway & Oxygen Therapy: Patient Spontanous Breathing and Patient connected to face mask oxygen  Post-op Assessment: Report given to RN  Post vital signs: Reviewed and stable  Last Vitals:  Vitals Value Taken Time  BP 160/108 01/02/2018  2:53 PM  Temp 37.1 C 01/02/2018  2:45 PM  Pulse 92 01/02/2018  2:53 PM  Resp 19 01/02/2018  2:53 PM  SpO2 100 % 01/02/2018  2:53 PM  Vitals shown include unvalidated device data.  Last Pain:  Vitals:   01/02/18 1117  TempSrc: Tympanic  PainSc: 5       Patients Stated Pain Goal: 0 (12/81/18 8677)  Complications: No apparent anesthesia complications

## 2018-01-02 NOTE — Anesthesia Post-op Follow-up Note (Signed)
Anesthesia QCDR form completed.        

## 2018-01-02 NOTE — Op Note (Signed)
Operative Note   01/02/2018 2:38 PM  PRE-OP DIAGNOSIS: FIBROID MENORRHAGIA,ANEMIA   POST-OP DIAGNOSIS: same   PROCEDURE: Laparotomy, Open Myomectomy   SURGEON: Barnett Applebaum, MD, FACOG  ASSISTANT: Dr Glennon Mac, No other capable assistant available, in surgery requiring high level assistant.  ANESTHESIA: Choice   ESTIMATED BLOOD LOSS: 562 mL   COMPLICATIONS: none  VTE PROPHYLAXIS: SCDs to the bilateral lower extremities  ANTIBIOTICS: Cefoxitin  CONDITION: stable  INDICATIONS: Large fibroid, Menorrhagia  DISPOSITION: PACU - hemodynamically stable.  FINDINGS: Examination under anesthesia revealed an approximately 8 week size, mobile globular uterus. Laparotomy findings included an enlarged uterine fibroids with largest approximately 5cm.   PROCEDURE IN DETAIL:  The patient was taken to the OR where her general anesthesia was administered. She was prepped and draped in the usual sterile fashion in the supine position after A Foley catheter was sterilely placed. Attention was turned to the patient's abdomen, where a Pfanensteil incision was made with the scalpel and carried to the underlying layer of fascia. The fascia was incised in the midline and this incision was extended with Mayo scissors. Next, the peritoneum was identified and grasped with Kelly clamps and entered sharply with Metzenbaum scissors. This incision was extended superiorly and inferiorly with the bovie with good visualization of the bladder. A survey of the patient's abdomen was performed with the above noted findings.   Next, the uterus was lifted out of the pelvis and the bowel packed with moist laparotomy sponges. The uterine serosa was injected with approximately 8 mL of dilute vasopressin in a verticle line with blanching noted. A vertical incision was made through the superficial myometrium to the level of the myoma with a scalpel. The fibroid was grasped with a towel clips and the myoma was shelled out using the  bovie/metz scissors in the plane of cleavage between the myoma and myometrium. The myometrium was then reapproximated in approximately two to four layers using 0 vicryl suture. The serosa was closed using 2-0 vicryl in a baseball stitch. Excellent hemostasis was noted. The uterus was returned to the abdomen, the laparotomy sponges were removed and the pelvis was irrigated. Again the incision was inspected and excellent hemostasis was noted.  The OnQ pain pump was then placed.  Trocars through the abdomen into the subfascial space.  Catheters threaded in place. Each flushed with 5 mL bupivacaine.    The fascia was reapproximated using 2-0 maxon suture. Subcutaneous layer irrigated and hemostasis assured.  The skin was closed with 4-0 vicryl in subcuticular fashion folloed by skin adhesive.  The patient tolerated the procedure well. All sponge, lap and needle counts were correct x 2. The patient was taken to the recovery room in stable condition.  Barnett Applebaum, MD, [facility], [facility] 01/02/2018  2:38 PM

## 2018-01-02 NOTE — Anesthesia Preprocedure Evaluation (Signed)
Anesthesia Evaluation  Patient identified by MRN, date of birth, ID band Patient awake    Reviewed: Allergy & Precautions, H&P , NPO status , Patient's Chart, lab work & pertinent test results  History of Anesthesia Complications Negative for: history of anesthetic complications  Airway Mallampati: I  TM Distance: >3 FB Neck ROM: full    Dental  (+) Chipped, Poor Dentition, Missing   Pulmonary neg shortness of breath, asthma , Current Smoker,           Cardiovascular negative cardio ROS       Neuro/Psych  Headaches, PSYCHIATRIC DISORDERS Anxiety Depression negative psych ROS   GI/Hepatic Neg liver ROS, hiatal hernia, GERD  Medicated and Controlled,  Endo/Other  diabetes, Type 2  Renal/GU      Musculoskeletal   Abdominal   Peds  Hematology negative hematology ROS (+)   Anesthesia Other Findings Past Medical History: No date: Anxiety No date: Asthma     Comment:  WELL CONTROLLED No date: Chronic anemia No date: Depression No date: Diabetes ([place]) No date: GERD (gastroesophageal reflux disease) 2018: History of blood transfusion     Comment:  GI BLEED No date: History of hiatal hernia  Past Surgical History: 05/18/2016: ESOPHAGOGASTRODUODENOSCOPY (EGD) WITH PROPOFOL; N/A     Comment:  Procedure: ESOPHAGOGASTRODUODENOSCOPY (EGD) WITH               PROPOFOL;  Surgeon: Manya Silvas, MD;  Location: [facility]              ENDOSCOPY;  Service: Endoscopy;  Laterality: N/A; No date: TUBAL LIGATION     Reproductive/Obstetrics negative OB ROS                             Anesthesia Physical Anesthesia Plan  ASA: III  Anesthesia Plan: General ETT   Post-op Pain Management:    Induction: Intravenous  PONV Risk Score and Plan: Ondansetron, Dexamethasone, Midazolam and Treatment may vary due to age or medical condition  Airway Management Planned: Oral ETT  Additional Equipment:    Intra-op Plan:   Post-operative Plan: Extubation in OR  Informed Consent: I have reviewed the patients History and Physical, chart, labs and discussed the procedure including the risks, benefits and alternatives for the proposed anesthesia with the patient or authorized representative who has indicated his/her understanding and acceptance.   Dental Advisory Given  Plan Discussed with: Anesthesiologist, CRNA and Surgeon  Anesthesia Plan Comments: (Patient consented for risks of anesthesia including but not limited to:  - adverse reactions to medications - damage to teeth, lips or other oral mucosa - sore throat or hoarseness - Damage to heart, brain, lungs or loss of life  Patient voiced understanding.)        Anesthesia Quick Evaluation

## 2018-01-03 ENCOUNTER — Encounter: Payer: Self-pay | Admitting: Obstetrics & Gynecology

## 2018-01-03 LAB — HEMOGLOBIN: Hemoglobin: 9.1 g/dL — ABNORMAL LOW (ref 12.0–16.0)

## 2018-01-03 MED ORDER — HYDROCODONE-ACETAMINOPHEN 5-325 MG PO TABS: 1.0000 | ORAL_TABLET | ORAL | 0 refills | Status: DC | PRN

## 2018-01-03 NOTE — Discharge Summary (Signed)
Gynecology Physician Postoperative Discharge Summary  Patient ID: Lori Bentley MRN: [id number] DOB/AGE: September 24, 1985 32 y.o.  Admit Date: 01/02/2018 Discharge Date: 01/03/2018  Preoperative Diagnoses: Fibroid uterus, Menorrhagia  Procedures: Procedure(s) (LRB): MYOMECTOMY (N/A)  Significant Labs: CBC Latest Ref Rng & Units 01/03/2018 12/30/2017 11/22/2016  WBC 3.6 - 11.0 K/uL - 8.7 11.9(H)  Hemoglobin 12.0 - 16.0 g/dL 9.1(L) 10.5(L) 15.5  Hematocrit 35.0 - 47.0 % - 33.2(L) 45.8  Platelets 150 - 440 K/uL - 581(H) 361    Hospital Course:  Lori Bentley is a 32 y.o. [id number]  admitted for scheduled surgery.  She underwent the procedures as mentioned above, her operation was uncomplicated. For further details about surgery, please refer to the operative report. Patient had an uncomplicated postoperative course. By time of discharge on POD#1, her pain was controlled on oral pain medications; she was ambulating, voiding without difficulty, tolerating regular diet and passing flatus. She was deemed stable for discharge to home.   Discharge Exam: Blood pressure 115/85, pulse 84, temperature 98.9 F (37.2 C), temperature source Oral, resp. rate 18, height 5' (1.524 m), weight 175 lb 1 oz (79.4 kg), last menstrual period 11/27/2017, SpO2 98 %. General appearance: alert and no distress  Resp: clear to auscultation bilaterally  Cardio: regular rate and rhythm  GI: soft, non-tender; bowel sounds normal; no masses, no organomegaly.  Incision: C/D/I, no erythema, no drainage noted Pelvic: scant blood on pad  Extremities: extremities normal, atraumatic, no cyanosis or edema and Homans sign is negative, no sign of DVT  Discharged Condition: Stable  Disposition: Discharge disposition: 01-Home or Self Care       Discharge Instructions    Call MD for:  persistant nausea and vomiting   Complete by:  As directed    Call MD for:  redness, tenderness, or signs of infection (pain, swelling,  redness, odor or green/yellow discharge around incision site)   Complete by:  As directed    Call MD for:  severe uncontrolled pain   Complete by:  As directed    Call MD for:  temperature >100.4   Complete by:  As directed    Change dressing (specify)   Complete by:  As directed    Dressing change: remove any dressings tomorrow   Diet general   Complete by:  As directed    Discharge instructions   Complete by:  As directed    Resume activities according to discharge instruction sheets   Increase activity slowly   Complete by:  As directed      Allergies as of 01/03/2018      Reactions   Nsaids    GI BLEED-HAD BLOOD TRASNFUSION   Peanut-containing Drug Products Itching   Itching in throat   Percocet [oxycodone-acetaminophen] Swelling   Swelling of face and throat   Phenylephrine-guaifenesin Other (See Comments)   Unknown reaction. Does not remember reaction      Medication List    STOP taking these medications   azithromycin 250 MG tablet Commonly known as:  ZITHROMAX Z-PAK   Iron-Vitamin C 65-125 MG Tabs   LORazepam 1 MG tablet Commonly known as:  ATIVAN   methylPREDNISolone 4 MG Tbpk tablet Commonly known as:  MEDROL DOSEPAK     TAKE these medications   albuterol 108 (90 Base) MCG/ACT inhaler Commonly known as:  PROVENTIL HFA;VENTOLIN HFA Inhale 2 puffs into the lungs every 6 (six) hours as needed for wheezing or shortness of breath.   escitalopram 20 MG tablet Commonly known as:  LEXAPRO Take 20 mg by mouth every morning.   HYDROcodone-acetaminophen 5-325 MG tablet Commonly known as:  NORCO/VICODIN Take 1 tablet by mouth every 4 (four) hours as needed for moderate pain.   lamoTRIgine 25 MG tablet Commonly known as:  LAMICTAL Take 25 mg by mouth every morning.   pantoprazole 40 MG tablet Commonly known as:  PROTONIX Take 1 tablet (40 mg total) by mouth 2 (two) times daily.            Discharge Care Instructions  (From admission, onward)         Start     Ordered   01/03/18 0000  Change dressing (specify)    Comments:  Dressing change: remove any dressings tomorrow   01/03/18 1312       Barnett Applebaum, MD

## 2018-01-03 NOTE — Discharge Instructions (Signed)
General Gynecological Post-Operative Instructions °You may expect to feel dizzy, weak, and drowsy for as long as 24 hours after receiving the medicine that made you sleep (anesthetic).  °Do not drive a car, ride a bicycle, participate in physical activities, or take public transportation until you are done taking narcotic pain medicines or as directed by your doctor.  °Do not drink alcohol or take tranquilizers.  °Do not take medicine that has not been prescribed by your doctor.  °Do not sign important papers or make important decisions while on narcotic pain medicines.  °Have a responsible person with you.  °CARE OF INCISION  °Keep incision clean and dry. °Take showers instead of baths until your doctor gives you permission to take baths.  °Avoid heavy lifting (more than 10 pounds/4.5 kilograms), pushing, or pulling.  °Avoid activities that may risk injury to your surgical site.  °No sexual intercourse or placement of anything in the vagina for 2 weeks or as instructed by your doctor. °If you have tubes coming from the wound site, check with your doctor regarding appropriate care of the tubes. °Only take prescription or over-the-counter medicines  for pain, discomfort, or fever as directed by your doctor. Do not take aspirin. It can make you bleed. Take medicines (antibiotics) that kill germs if they are prescribed for you.  °Call the office or go to the ER if:  °You feel sick to your stomach (nauseous) and you start to throw up (vomit).  °You have trouble eating or drinking.  °You have an oral temperature above 101.  °You have constipation that is not helped by adjusting diet or increasing fluid intake. Pain medicines are a common cause of constipation.  °You have any other concerns. °SEEK IMMEDIATE MEDICAL CARE IF:  °You have persistent dizziness.  °You have difficulty breathing or a congested sounding (croupy) cough.  °You have an oral temperature above 102.5, not controlled by medicine.  °There is increasing  pain or tenderness near or in the surgical site.  ° ° ° °

## 2018-01-03 NOTE — Progress Notes (Signed)
Patient discharged home. Discharge instructions, prescriptions and follow up appointment given to and reviewed with patient. Patient verbalized understanding. Patient wheeled out by auxiliary.  

## 2018-01-03 NOTE — Progress Notes (Signed)
1 Day Post-Op Procedure(s) (LRB): MYOMECTOMY (N/A)  Subjective: Patient reports lower abd pain around incision. No nausea.  Has not voided yet.  No BM.    Objective: I have reviewed patient's vital signs, intake and output, medications and labs.  Abd: Min T, ND Incision: clean, dry and intact Extr: no calf T, no edema  Assessment: s/p Procedure(s): MYOMECTOMY (N/A): stable  Plan: Advance diet Encourage ambulation Advance to PO medication Discontinue IV fluids Voiding trial this am  Discharge planning discussed OnQ pain pum pros and cons discussed, should stay in place for 4 days   LOS: 1 day    [name] 01/03/2018, 7:29 AM

## 2018-01-06 ENCOUNTER — Telehealth: Payer: Self-pay

## 2018-01-06 LAB — SURGICAL PATHOLOGY

## 2018-01-06 NOTE — Telephone Encounter (Signed)
Spoke w/pt. Notified results have not returned. Will send message to [facility] to contact pt when results are received.

## 2018-01-06 NOTE — Telephone Encounter (Signed)
Let her know - no cancer

## 2018-01-06 NOTE — Telephone Encounter (Signed)
Pt inquiring about her results of the fibroid she had removed on Thursday 01/02/18 by [facility]. Cb#[id number]

## 2018-01-07 NOTE — Telephone Encounter (Signed)
Pt.notified

## 2018-01-16 ENCOUNTER — Ambulatory Visit (INDEPENDENT_AMBULATORY_CARE_PROVIDER_SITE_OTHER): Payer: Medicaid Other | Admitting: Obstetrics & Gynecology

## 2018-01-16 ENCOUNTER — Encounter: Payer: Self-pay | Admitting: Obstetrics & Gynecology

## 2018-01-16 VITALS — BP 110/70 | Ht 60.0 in | Wt 174.0 lb

## 2018-01-16 DIAGNOSIS — D219 Benign neoplasm of connective and other soft tissue, unspecified: Secondary | ICD-10-CM

## 2018-01-16 MED ORDER — HYDROCODONE-ACETAMINOPHEN 5-325 MG PO TABS: 1.0000 | ORAL_TABLET | ORAL | 0 refills | Status: DC | PRN

## 2018-01-16 NOTE — Progress Notes (Signed)
  Postoperative Follow-up Patient presents post op from myomectomy for fibroids, 2 weeks ago. Images:    Pathology: DIAGNOSIS:  A. FIBROIDS; MYOMECTOMY:  - FRAGMENTS OF LEIOMYOMA.  - NEGATIVE FOR ATYPIA AND MALIGNANCY. Subjective: Patient reports marked improvement in her preop symptoms. Eating a regular diet without difficulty. Pain is controlled with current analgesics. Medications being used: prescription NSAID's including Vicodin.  Activity: normal activities of daily living. Patient reports vaginal sx's of Irregular bleeding  Objective: BP 110/70   Ht 5' (1.524 m)   Wt 174 lb (78.9 kg)   BMI 33.98 kg/m  Physical Exam  Constitutional: She is oriented to person, place, and time. She appears well-developed and well-nourished. No distress.  Cardiovascular: Normal rate.  Pulmonary/Chest: Effort normal.  Abdominal: Soft. She exhibits no distension. There is no tenderness.  Incision Healing Well   Musculoskeletal: Normal range of motion.  Neurological: She is alert and oriented to person, place, and time. No cranial nerve deficit.  Skin: Skin is warm and dry.  Psychiatric: She has a normal mood and affect.    Assessment: s/p :  myomectomy progressing well  Plan: Patient has done well after surgery with no apparent complications.  I have discussed the post-operative course to date, and the expected progress moving forward.  The patient understands what complications to be concerned about.  I will see the patient in routine follow up, or sooner if needed.    Activity plan: No heavy lifting.  [name] 01/16/2018, 9:22 AM

## 2018-02-20 ENCOUNTER — Ambulatory Visit: Payer: Medicaid Other | Admitting: Obstetrics & Gynecology

## 2018-02-25 ENCOUNTER — Ambulatory Visit: Payer: Medicaid Other | Admitting: Obstetrics & Gynecology

## 2019-03-27 ENCOUNTER — Other Ambulatory Visit: Payer: Self-pay

## 2019-03-27 ENCOUNTER — Emergency Department: Payer: Medicaid Other

## 2019-03-27 ENCOUNTER — Inpatient Hospital Stay
Admission: EM | Admit: 2019-03-27 | Discharge: 2019-03-28 | DRG: 812 | Disposition: A | Discharge status: Home / Self Care | Payer: Medicaid Other | Attending: Internal Medicine | Admitting: Internal Medicine

## 2019-03-27 ENCOUNTER — Encounter: Payer: Self-pay | Admitting: Emergency Medicine

## 2019-03-27 DIAGNOSIS — D509 Iron deficiency anemia, unspecified: Secondary | ICD-10-CM | POA: Diagnosis present

## 2019-03-27 DIAGNOSIS — F419 Anxiety disorder, unspecified: Secondary | ICD-10-CM | POA: Diagnosis present

## 2019-03-27 DIAGNOSIS — Z79899 Other long term (current) drug therapy: Secondary | ICD-10-CM | POA: Diagnosis not present

## 2019-03-27 DIAGNOSIS — Z818 Family history of other mental and behavioral disorders: Secondary | ICD-10-CM | POA: Diagnosis not present

## 2019-03-27 DIAGNOSIS — Z888 Allergy status to other drugs, medicaments and biological substances status: Secondary | ICD-10-CM

## 2019-03-27 DIAGNOSIS — Z885 Allergy status to narcotic agent status: Secondary | ICD-10-CM | POA: Diagnosis not present

## 2019-03-27 DIAGNOSIS — R531 Weakness: Secondary | ICD-10-CM | POA: Diagnosis present

## 2019-03-27 DIAGNOSIS — Z91010 Allergy to peanuts: Secondary | ICD-10-CM

## 2019-03-27 DIAGNOSIS — Z20828 Contact with and (suspected) exposure to other viral communicable diseases: Secondary | ICD-10-CM | POA: Diagnosis present

## 2019-03-27 DIAGNOSIS — F329 Major depressive disorder, single episode, unspecified: Secondary | ICD-10-CM | POA: Diagnosis present

## 2019-03-27 DIAGNOSIS — J45909 Unspecified asthma, uncomplicated: Secondary | ICD-10-CM | POA: Diagnosis present

## 2019-03-27 DIAGNOSIS — F17210 Nicotine dependence, cigarettes, uncomplicated: Secondary | ICD-10-CM | POA: Diagnosis present

## 2019-03-27 DIAGNOSIS — E119 Type 2 diabetes mellitus without complications: Secondary | ICD-10-CM | POA: Diagnosis present

## 2019-03-27 DIAGNOSIS — R55 Syncope and collapse: Secondary | ICD-10-CM | POA: Diagnosis present

## 2019-03-27 DIAGNOSIS — K922 Gastrointestinal hemorrhage, unspecified: Secondary | ICD-10-CM | POA: Diagnosis present

## 2019-03-27 DIAGNOSIS — Z886 Allergy status to analgesic agent status: Secondary | ICD-10-CM | POA: Diagnosis not present

## 2019-03-27 DIAGNOSIS — K219 Gastro-esophageal reflux disease without esophagitis: Secondary | ICD-10-CM | POA: Diagnosis present

## 2019-03-27 DIAGNOSIS — Z825 Family history of asthma and other chronic lower respiratory diseases: Secondary | ICD-10-CM | POA: Diagnosis not present

## 2019-03-27 LAB — PROTIME-INR
INR: 0.9 (ref 0.8–1.2)
Prothrombin Time: 12.5 seconds (ref 11.4–15.2)

## 2019-03-27 LAB — URINALYSIS, COMPLETE (UACMP) WITH MICROSCOPIC
Bacteria, UA: NONE SEEN
Bilirubin Urine: NEGATIVE
Glucose, UA: NEGATIVE mg/dL
Hgb urine dipstick: NEGATIVE
Ketones, ur: NEGATIVE mg/dL
Leukocytes,Ua: NEGATIVE
Nitrite: NEGATIVE
Protein, ur: NEGATIVE mg/dL
Specific Gravity, Urine: 1.021 (ref 1.005–1.030)
pH: 5 (ref 5.0–8.0)

## 2019-03-27 LAB — CBC
HCT: 21.9 % — ABNORMAL LOW (ref 36.0–46.0)
Hemoglobin: 5.8 g/dL — ABNORMAL LOW (ref 12.0–15.0)
MCH: 18.6 pg — ABNORMAL LOW (ref 26.0–34.0)
MCHC: 26.5 g/dL — ABNORMAL LOW (ref 30.0–36.0)
MCV: 70.4 fL — ABNORMAL LOW (ref 80.0–100.0)
Platelets: 468 10*3/uL — ABNORMAL HIGH (ref 150–400)
RBC: 3.11 MIL/uL — ABNORMAL LOW (ref 3.87–5.11)
RDW: 19.9 % — ABNORMAL HIGH (ref 11.5–15.5)
WBC: 12.3 10*3/uL — ABNORMAL HIGH (ref 4.0–10.5)
nRBC: 0 % (ref 0.0–0.2)

## 2019-03-27 LAB — HCG, QUANTITATIVE, PREGNANCY: hCG, Beta Chain, Quant, S: 1 m[IU]/mL (ref <5)

## 2019-03-27 LAB — BASIC METABOLIC PANEL
Anion gap: 10 (ref 5–15)
BUN: 17 mg/dL (ref 6–20)
CO2: 22 mmol/L (ref 22–32)
Calcium: 8.5 mg/dL — ABNORMAL LOW (ref 8.9–10.3)
Chloride: 103 mmol/L (ref 98–111)
Creatinine, Ser: 0.61 mg/dL (ref 0.44–1.00)
GFR calc Af Amer: >60 mL/min (ref >60)
GFR calc non Af Amer: >60 mL/min (ref >60)
Glucose, Bld: 109 mg/dL — ABNORMAL HIGH (ref 70–99)
Potassium: 3.6 mmol/L (ref 3.5–5.1)
Sodium: 135 mmol/L (ref 135–145)

## 2019-03-27 LAB — SARS CORONAVIRUS 2 BY RT PCR (HOSPITAL ORDER, PERFORMED IN ~~LOC~~ HOSPITAL LAB): SARS Coronavirus 2: NEGATIVE

## 2019-03-27 LAB — PREPARE RBC (CROSSMATCH)

## 2019-03-27 LAB — POCT PREGNANCY, URINE: Preg Test, Ur: NEGATIVE

## 2019-03-27 MED ORDER — ZOLPIDEM TARTRATE 5 MG PO TABS: 5.0000 mg | ORAL_TABLET | Freq: Every evening | ORAL | Status: DC | PRN

## 2019-03-27 MED ORDER — ONDANSETRON HCL 4 MG PO TABS: 4.0000 mg | ORAL_TABLET | Freq: Four times a day (QID) | ORAL | Status: DC | PRN

## 2019-03-27 MED ORDER — PANTOPRAZOLE SODIUM 40 MG IV SOLR: 40.0000 mg | Freq: Two times a day (BID) | INTRAVENOUS | Status: DC

## 2019-03-27 MED ORDER — INSULIN ASPART 100 UNIT/ML ~~LOC~~ SOLN: 0.0000 [IU] | Freq: Three times a day (TID) | SUBCUTANEOUS | Status: DC

## 2019-03-27 MED ORDER — SODIUM CHLORIDE 0.9 % IV SOLN
INTRAVENOUS | Status: DC
  Administered 2019-03-28: 05:00:00 via INTRAVENOUS

## 2019-03-27 MED ORDER — SODIUM CHLORIDE 0.9 % IV SOLN
80.0000 mg | INTRAVENOUS | Status: DC
  Administered 2019-03-28: 80 mg via INTRAVENOUS
  Filled 2019-03-27 (×2): qty 80

## 2019-03-27 MED ORDER — SODIUM CHLORIDE 0.9 % IV SOLN
10.0000 mL/h | Freq: Once | INTRAVENOUS | Status: AC
  Administered 2019-03-27: 21:00:00 10 mL/h via INTRAVENOUS

## 2019-03-27 MED ORDER — SODIUM CHLORIDE 0.9 % IV SOLN
8.0000 mg/h | INTRAVENOUS | Status: DC
  Administered 2019-03-28: 8 mg/h via INTRAVENOUS
  Filled 2019-03-27: qty 80

## 2019-03-27 MED ORDER — SODIUM CHLORIDE 0.9 % IV SOLN
40.0000 mg | Freq: Once | INTRAVENOUS | Status: DC
  Filled 2019-03-27: qty 4

## 2019-03-27 MED ORDER — NICOTINE 14 MG/24HR TD PT24
14.0000 mg | MEDICATED_PATCH | Freq: Every day | TRANSDERMAL | Status: DC
  Filled 2019-03-27: qty 1

## 2019-03-27 MED ORDER — SODIUM CHLORIDE 0.9 % IV SOLN: 10.0000 mL/h | Freq: Once | INTRAVENOUS | Status: DC

## 2019-03-27 MED ORDER — ESCITALOPRAM OXALATE 10 MG PO TABS
20.0000 mg | ORAL_TABLET | ORAL | Status: DC
  Filled 2019-03-27: qty 2

## 2019-03-27 MED ORDER — ONDANSETRON HCL 4 MG/2ML IJ SOLN: 4.0000 mg | Freq: Four times a day (QID) | INTRAMUSCULAR | Status: DC | PRN

## 2019-03-27 NOTE — H&P (Signed)
[place] [facility] [place] NAME: Lori Bentley    MR#:  [id number]  DATE OF BIRTH:  1985/12/06  DATE OF ADMISSION:  03/27/2019  PRIMARY CARE PHYSICIAN: Freddy Finner, NP   REQUESTING/REFERRING PHYSICIAN: Dr. Burlene Arnt.  CHIEF COMPLAINT:   Chief Complaint  Patient presents with  . Loss of Consciousness   Syncope today HISTORY OF PRESENT ILLNESS:  Lori Bentley  is a 33 y.o. female with a known history of multiple medical problems as below.  The patient presents the ED with above chief complaint.  The patient had a one episode of a dark stool last night and have 2 episodes of lightheadedness and almost passed out.  She also complains of generalized weakness but no chest pain or shortness of breath.  She denies any NSAIDs intake.  She has history of GI bleeding and heavy vaginal bleeding with her.  Her hemoglobin decreased to 5.8.  ED physician discussed with GI physician Dr. Alice Reichert and request admission. PAST MEDICAL HISTORY:   Past Medical History:  Diagnosis Date  . Anxiety   . Asthma    WELL CONTROLLED  . Chronic anemia   . Depression   . Diabetes ([place]) 12/2017   patient denies diabetes  . GERD (gastroesophageal reflux disease)   . History of blood transfusion 2018   GI BLEED  . History of hiatal hernia     PAST SURGICAL HISTORY:   Past Surgical History:  Procedure Laterality Date  . ESOPHAGOGASTRODUODENOSCOPY (EGD) WITH PROPOFOL N/A 05/18/2016   Procedure: ESOPHAGOGASTRODUODENOSCOPY (EGD) WITH PROPOFOL;  Surgeon: Manya Silvas, MD;  Location: [facility] ENDOSCOPY;  Service: Endoscopy;  Laterality: N/A;  . MYOMECTOMY N/A 01/02/2018   Procedure: MYOMECTOMY;  Surgeon: Gae Dry, MD;  Location: [facility] ORS;  Service: Gynecology;  Laterality: N/A;  . TUBAL LIGATION  2012    SOCIAL HISTORY:   Social History   Tobacco Use  . Smoking status: Current Every Day Smoker    Packs/day: 0.25    Years: 1.00    Pack years: 0.25    Types: Cigarettes  . Smokeless tobacco: Never Used  Substance Use Topics  . Alcohol use: Not Currently    FAMILY HISTORY:   Family History  Problem Relation Age of Onset  . Anxiety disorder Mother   . Asthma Mother   . Fibroids Mother   . GER disease Father     DRUG ALLERGIES:   Allergies  Allergen Reactions  . Nsaids     GI BLEED-HAD BLOOD TRASNFUSION  . Peanut-Containing Drug Products Itching    Itching in throat  . Percocet [Oxycodone-Acetaminophen] Swelling    Swelling of face and throat  . Phenylephrine-Guaifenesin Other (See Comments)    Unknown reaction. Does not remember reaction    REVIEW OF SYSTEMS:   Review of Systems  Constitutional: Positive for malaise/fatigue. Negative for chills and fever.  HENT: Negative for sore throat.   Eyes: Negative for blurred vision and double vision.  Respiratory: Negative for cough, hemoptysis, shortness of breath, wheezing and stridor.   Cardiovascular: Negative for chest pain, palpitations, orthopnea and leg swelling.  Gastrointestinal: Positive for melena. Negative for abdominal pain, blood in stool, diarrhea, nausea and vomiting.  Genitourinary: Negative for dysuria, flank pain and hematuria.  Musculoskeletal: Negative for back pain and joint pain.  Skin: Negative for rash.  Neurological: Positive for loss of consciousness. Negative for dizziness, sensory change, focal weakness, seizures, weakness and headaches.  Endo/Heme/Allergies: Negative for polydipsia.  Psychiatric/Behavioral:  Negative for depression. The patient is not nervous/anxious.     MEDICATIONS AT HOME:   Prior to Admission medications   Medication Sig Start Date End Date Taking? Authorizing Provider  albuterol (PROVENTIL HFA;VENTOLIN HFA) 108 (90 Base) MCG/ACT inhaler Inhale 2 puffs into the lungs every 6 (six) hours as needed for wheezing or shortness of breath. 11/29/17  Yes Darel Hong, MD  escitalopram (LEXAPRO) 20 MG tablet Take 20 mg by mouth  every morning.  12/10/14  Yes [provider]  lurasidone (LATUDA) 20 MG TABS tablet Take 20 mg by mouth daily.   Yes [provider]  pantoprazole (PROTONIX) 40 MG tablet Take 1 tablet (40 mg total) by mouth 2 (two) times daily. 04/25/16 03/27/19 Yes Hower, Aaron Mose, MD  lamoTRIgine (LAMICTAL) 25 MG tablet Take 25 mg by mouth every morning.     [provider]      VITAL SIGNS:  Blood pressure 132/88, pulse (!) 112, temperature 99.4 F (37.4 C), temperature source Oral, resp. rate 11, height 5' (1.524 m), weight 79.4 kg, last menstrual period 02/24/2019, SpO2 98 %.  PHYSICAL EXAMINATION:  Physical Exam  GENERAL:  33 y.o.-year-old patient lying in the bed with no acute distress.  Obesity. EYES: Pupils equal, round, reactive to light and accommodation. No scleral icterus. Extraocular muscles intact.  HEENT: Head atraumatic, normocephalic. NECK:  Supple, no jugular venous distention. No thyroid enlargement, no tenderness.  LUNGS: Normal breath sounds bilaterally, no wheezing, rales,rhonchi or crepitation. No use of accessory muscles of respiration.  CARDIOVASCULAR: S1, S2 normal. No murmurs, rubs, or gallops.  ABDOMEN: Soft, nontender, nondistended. Bowel sounds present. No organomegaly or mass.  EXTREMITIES: No pedal edema, cyanosis, or clubbing.  NEUROLOGIC: Cranial nerves II through XII are intact. Muscle strength 5/5 in all extremities. Sensation intact. Gait not checked.  PSYCHIATRIC: The patient is alert and oriented x 3.  SKIN: No obvious rash, lesion, or ulcer.   LABORATORY PANEL:   CBC Recent Labs  Lab 03/27/19 1817  WBC 12.3*  HGB 5.8*  HCT 21.9*  PLT 468*   ------------------------------------------------------------------------------------------------------------------  Chemistries  Recent Labs  Lab 03/27/19 1817  NA 135  K 3.6  CL 103  CO2 22  GLUCOSE 109*  BUN 17  CREATININE 0.61  CALCIUM 8.5*    ------------------------------------------------------------------------------------------------------------------  Cardiac Enzymes No results for input(s): TROPONINI in the last 168 hours. ------------------------------------------------------------------------------------------------------------------  RADIOLOGY:  Dg Chest Port 1 View  Result Date: 03/27/2019 CLINICAL DATA:  Syncope.  Vomiting and weakness. EXAM: PORTABLE CHEST 1 VIEW COMPARISON:  None. FINDINGS: The heart size and mediastinal contours are within normal limits. Both lungs are clear. The visualized skeletal structures are unremarkable. IMPRESSION: No active disease. Electronically Signed   By: Titus Dubin M.D.   On: 03/27/2019 19:15      IMPRESSION AND PLAN:   GI bleeding, possible upper GI bleeding. The patient will be admitted to medical floor. N.p.o., Protonix IV, IV fluid support, per Dr. Alice Reichert, possible EGD tomorrow.  Anemia of chronic disease and due to acute blood loss secondary to GI bleeding. PRBC transfusion, follow-up hemoglobin.  Syncope, possible due to above.  Diabetes.  Start sliding scale. Leukocytosis and thrombocytosis.  Possible due to reaction, follow-up CBC. Asthma.  Stable. Tobacco abuse.  Smoking cessation was counseled for 3 to 4 minutes, nicotine patch.    All the records are reviewed and case discussed with ED provider. Management plans discussed with the patient, her husband and they are in agreement.  CODE STATUS:  Full code.  TOTAL TIME TAKING CARE OF THIS PATIENT: 47 minutes.    Demetrios Loll M.D on 03/27/2019 at 8:07 PM  Between 7am to 6pm - Pager - [phone]  After 6pm go to [email or web address] - Proofreader  [facility]  Office  [phone]  CC: Primary care physician; Freddy Finner, NP   Note: This dictation was prepared with Dragon dictation along with smaller phrase technology. Any transcriptional errors that result from this  process are unin

## 2019-03-27 NOTE — ED Triage Notes (Addendum)
Patient states earlier this week she had [place] food that she thinks gave her food poisoning earlier in the week. Patient states she had nausea and vomiting the next day. States she has had 2 syncopal episodes in the past two days and any time she changes position she feel like she is going to pass out. Patient denies N/V for the last two days. States history of iron deficiency which has made her feel the same. Also reports black stool for the past week. She does take an iron supplement.

## 2019-03-27 NOTE — ED Triage Notes (Signed)
This RN to patient in lobby where patient is sitting due to SO refusing to leave the lobby. Pt pt appears to be staring off into space but blinking when stimulated. Pt with immediate response to sternal rub, pushing this RN's arm away roughly. Pt's SO yelling at this RN regarding policy of him being in lobby. Pt intermittently stating she wants to leave. Pt taken back to triage for evaluation.

## 2019-03-27 NOTE — ED Provider Notes (Addendum)
[facility] Emergency Department Provider Note  ____________________________________________   I have reviewed the triage vital signs and the nursing notes. Where available I have reviewed prior notes and, if possible and indicated, outside hospital notes.   Patient seen and evaluated during the coronavirus epidemic during a time with low staffing  Patient seen for the symptoms described in the history of present illness. She was evaluated in the context of the global COVID-19 pandemic, which necessitated consideration that the patient might be at risk for infection with the SARS-CoV-2 virus that causes COVID-19. Institutional protocols and algorithms that pertain to the evaluation of patients at risk for COVID-19 are in a state of rapid change based on information released by regulatory bodies including the CDC and federal and state organizations. These policies and algorithms were followed during the patient's care in the ED.    HISTORY  Chief Complaint Loss of Consciousness    HPI Lori Bentley is a 33 y.o. female with a history of significant chronic iron deficiency anemia, who states she does take her iron pills, states that she had some upset stomach with one episode of dark stools over the last day or 2 and then felt lightheaded and came close to passed out or passed out x2.  No trauma.  She does not take NSAIDs, she does have a history of very heavy vaginal bleeding with her periods, she does have her tubes tied.  Last period was about a month ago.  Normal.  Did have a myomectomy but still has heavy periods.  She denies  abdominal pain at this time fever chills or head injury.  However, she still feels somewhat weak.  Hemoglobin was performed in triage, and she has a hemoglobin of 5.8.  Patient does have a slight cough she states as well she is not a Jehovah's Witness and has no personal troll or religious reasons not to have transfusion.    Past Medical  History:  Diagnosis Date  . Anxiety   . Asthma    WELL CONTROLLED  . Chronic anemia   . Depression   . Diabetes ([place]) 12/2017   patient denies diabetes  . GERD (gastroesophageal reflux disease)   . History of blood transfusion 2018   GI BLEED  . History of hiatal hernia     Patient Active Problem List   Diagnosis Date Noted  . Fibroids 12/09/2017  . IDA (iron deficiency anemia) 05/16/2016  . New onset of headaches 05/16/2016  . Menorrhagia with regular cycle 05/16/2016  . Dehydration 05/16/2016  . Nausea with vomiting 05/16/2016  . Blood loss anemia 04/23/2016  . Hematemesis 04/23/2016  . Melena 04/23/2016  . Diabetes ([place]) 04/23/2016  . GERD (gastroesophageal reflux disease) 04/23/2016    Past Surgical History:  Procedure Laterality Date  . ESOPHAGOGASTRODUODENOSCOPY (EGD) WITH PROPOFOL N/A 05/18/2016   Procedure: ESOPHAGOGASTRODUODENOSCOPY (EGD) WITH PROPOFOL;  Surgeon: Manya Silvas, MD;  Location: [facility] ENDOSCOPY;  Service: Endoscopy;  Laterality: N/A;  . MYOMECTOMY N/A 01/02/2018   Procedure: MYOMECTOMY;  Surgeon: Gae Dry, MD;  Location: [facility] ORS;  Service: Gynecology;  Laterality: N/A;  . TUBAL LIGATION  2012    Prior to Admission medications   Medication Sig Start Date End Date Taking? Authorizing Provider  albuterol (PROVENTIL HFA;VENTOLIN HFA) 108 (90 Base) MCG/ACT inhaler Inhale 2 puffs into the lungs every 6 (six) hours as needed for wheezing or shortness of breath. 11/29/17   Darel Hong, MD  escitalopram (LEXAPRO) 20 MG tablet Take 20  mg by mouth every morning.  12/10/14   [provider]  HYDROcodone-acetaminophen (NORCO/VICODIN) 5-325 MG tablet Take 1 tablet by mouth every 4 (four) hours as needed for moderate pain. 01/16/18   Gae Dry, MD  lamoTRIgine (LAMICTAL) 25 MG tablet Take 25 mg by mouth every morning.     [provider]  pantoprazole (PROTONIX) 40 MG tablet Take 1 tablet (40 mg total) by mouth 2 (two) times  daily. 04/25/16 12/19/18  Hower, Aaron Mose, MD    Allergies Nsaids, Peanut-containing drug products, Percocet [oxycodone-acetaminophen], and Phenylephrine-guaifenesin  Family History  Problem Relation Age of Onset  . Anxiety disorder Mother   . Asthma Mother   . Fibroids Mother   . GER disease Father     Social History Social History   Tobacco Use  . Smoking status: Current Every Day Smoker    Packs/day: 0.25    Years: 1.00    Pack years: 0.25    Types: Cigarettes  . Smokeless tobacco: Never Used  Substance Use Topics  . Alcohol use: Not Currently  . Drug use: No    Review of Systems Constitutional: No fever/chills Eyes: No visual changes. ENT: No sore throat. No stiff neck no neck pain Cardiovascular: Denies chest pain. Respiratory: Denies shortness of breath. Gastrointestinal:   no vomiting.  No diarrhea.  No constipation. Genitourinary: Negative for dysuria. Musculoskeletal: Negative lower extremity swelling Skin: Negative for rash. Neurological: Negative for severe headaches, focal weakness or numbness.   ____________________________________________   PHYSICAL EXAM:  VITAL SIGNS: ED Triage Vitals  Enc Vitals Group     BP 03/27/19 1802 109/78     Pulse Rate 03/27/19 1802 (!) 114     Resp 03/27/19 1802 16     Temp 03/27/19 1802 99.4 F (37.4 C)     Temp Source 03/27/19 1802 Oral     SpO2 03/27/19 1802 100 %     Weight 03/27/19 1804 175 lb (79.4 kg)     Height 03/27/19 1804 5' (1.524 m)     Head Circumference --      Peak Flow --      Pain Score 03/27/19 1811 9     Pain Loc --      Pain Edu? --      Excl. in [place]? --     Constitutional: Alert and oriented. Well appearing and in no acute distress. Eyes: Conjunctivae are normal Head: Atraumatic HEENT: No congestion/rhinnorhea. Mucous membranes are moist.  Oropharynx non-erythematous Neck:   Nontender with no meningismus, no masses, no stridor Cardiovascular: Normal rate, regular rhythm. Grossly normal  heart sounds.  Good peripheral circulation. Respiratory: Normal respiratory effort.  No retractions. Lungs CTAB. Abdominal: Soft and nontender. No distention. No guarding no rebound Back:  There is no focal tenderness or step off.  there is no midline tenderness there are no lesions noted. there is no CVA tenderness Musculoskeletal: No lower extremity tenderness, no upper extremity tenderness. No joint effusions, no DVT signs strong distal pulses no edema Neurologic:  Normal speech and language. No gross focal neurologic deficits are appreciated.  Skin:  Skin is warm, dry and intact. No rash noted. Psychiatric: Mood and affect are normal. Speech and behavior are normal.  ____________________________________________   LABS (all labs ordered are listed, but only abnormal results are displayed)  Labs Reviewed  BASIC METABOLIC PANEL - Abnormal; Notable for the following components:      Result Value   Glucose, Bld 109 (*)    Calcium  8.5 (*)    All other components within normal limits  CBC - Abnormal; Notable for the following components:   WBC 12.3 (*)    RBC 3.11 (*)    Hemoglobin 5.8 (*)    HCT 21.9 (*)    MCV 70.4 (*)    MCH 18.6 (*)    MCHC 26.5 (*)    RDW 19.9 (*)    Platelets 468 (*)    All other components within normal limits  SARS CORONAVIRUS 2 (TAT 6-12 HRS)  URINALYSIS, COMPLETE (UACMP) WITH MICROSCOPIC  HCG, QUANTITATIVE, PREGNANCY  PROTIME-INR  CBG MONITORING, ED  POC URINE PREG, ED  POC OCCULT BLOOD, ED  TYPE AND SCREEN  PREPARE RBC (CROSSMATCH)    Pertinent labs  results that were available during my care of the patient were reviewed by me and considered in my medical decision making (see chart for details). ____________________________________________  EKG  I personally interpreted any EKGs ordered by me or triage Sinus, mild tachycardia noted normal axis, no acute ST elevation or depression specific ST  changes ____________________________________________  RADIOLOGY  Pertinent labs & imaging results that were available during my care of the patient were reviewed by me and considered in my medical decision making (see chart for details). If possible, patient and/or family made aware of any abnormal findings.  No results found. ____________________________________________    PROCEDURES  Procedure(s) performed: None  Procedures  Critical Care performed: CRITICAL CARE Performed by: Schuyler Amor   Total critical care time: 45 minutes  Critical care time was exclusive of separately billable procedures and treating other patients.  Critical care was necessary to treat or prevent imminent or life-threatening deterioration.  Critical care was time spent personally by me on the following activities: development of treatment plan with patient and/or surrogate as well as nursing, discussions with consultants, evaluation of patient's response to treatment, examination of patient, obtaining history from patient or surrogate, ordering and performing treatments and interventions, ordering and review of laboratory studies, ordering and review of radiographic studies, pulse oximetry and re-evaluation of patient's condition.   ____________________________________________   INITIAL IMPRESSION / ASSESSMENT AND PLAN / ED COURSE  Pertinent labs & imaging results that were available during my care of the patient were reviewed by me and considered in my medical decision making (see chart for details).   Patient here with significant anemia, and passing out spells, has had one black stool.  Did have a negative EGD 2017 for similar symptoms.  Does consent to blood transfusion which we will give.  Most likely this is multifactorial with at least some contributing from iron deficiency anemia and chronic menstrual.  Loss however we will check a rectal exam to see if she is guaiac positive and patient  likely will be admitted for symptomatic anemia.  She does have a slight cough lungs are clear and sats are good protocol we will send coronavirus and I will also obtain a screening chest x-ray.  ----------------------------------------- 7:14 PM on 03/27/2019 -----------------------------------------  Rectal exam: Female chaperone present, guaiac positive dark stool.  Talk to Dr. Alice Reichert, of GI.  Appreciate consult, he agrees with management and will follow as an inpatient admitting to hospitalist service   ____________________________________________   FINAL CLINICAL IMPRESSION(S) / ED DIAGNOSES  Final diagnoses:  Weakness      This chart was dictated using voice recognition software.  Despite best efforts to proofread,  errors can occur which can change meaning.      Charlotte Crumb  A, MD 03/27/19 Einar Crow    Schuyler Amor, MD 03/27/19 1914    Schuyler Amor, MD 03/27/19 [phone]

## 2019-03-28 LAB — CBC
HCT: 25.3 % — ABNORMAL LOW (ref 36.0–46.0)
Hemoglobin: 7.7 g/dL — ABNORMAL LOW (ref 12.0–15.0)
MCH: 23 pg — ABNORMAL LOW (ref 26.0–34.0)
MCHC: 30.4 g/dL (ref 30.0–36.0)
MCV: 75.5 fL — ABNORMAL LOW (ref 80.0–100.0)
Platelets: 286 10*3/uL (ref 150–400)
RBC: 3.35 MIL/uL — ABNORMAL LOW (ref 3.87–5.11)
RDW: 20.9 % — ABNORMAL HIGH (ref 11.5–15.5)
WBC: 7.7 10*3/uL (ref 4.0–10.5)
nRBC: 0 % (ref 0.0–0.2)

## 2019-03-28 LAB — IRON AND TIBC
Iron: 14 ug/dL — ABNORMAL LOW (ref 28–170)
Saturation Ratios: 4 % — ABNORMAL LOW (ref 10.4–31.8)
TIBC: 388 ug/dL (ref 250–450)
UIBC: 374 ug/dL

## 2019-03-28 LAB — RETICULOCYTES
Immature Retic Fract: 32.9 % — ABNORMAL HIGH (ref 2.3–15.9)
RBC.: 3.36 MIL/uL — ABNORMAL LOW (ref 3.87–5.11)
Retic Count, Absolute: 30.9 10*3/uL (ref 19.0–186.0)
Retic Ct Pct: 0.9 % (ref 0.4–3.1)

## 2019-03-28 LAB — BASIC METABOLIC PANEL
Anion gap: 6 (ref 5–15)
BUN: 10 mg/dL (ref 6–20)
CO2: 23 mmol/L (ref 22–32)
Calcium: 8 mg/dL — ABNORMAL LOW (ref 8.9–10.3)
Chloride: 108 mmol/L (ref 98–111)
Creatinine, Ser: 0.67 mg/dL (ref 0.44–1.00)
GFR calc Af Amer: >60 mL/min (ref >60)
GFR calc non Af Amer: >60 mL/min (ref >60)
Glucose, Bld: 99 mg/dL (ref 70–99)
Potassium: 3.5 mmol/L (ref 3.5–5.1)
Sodium: 137 mmol/L (ref 135–145)

## 2019-03-28 LAB — HEMOGLOBIN AND HEMATOCRIT, BLOOD
HCT: 25.2 % — ABNORMAL LOW (ref 36.0–46.0)
Hemoglobin: 7.6 g/dL — ABNORMAL LOW (ref 12.0–15.0)

## 2019-03-28 LAB — GLUCOSE, CAPILLARY
Glucose-Capillary: 97 mg/dL (ref 70–99)
Glucose-Capillary: 96 mg/dL (ref 70–99)

## 2019-03-28 LAB — FOLATE: Folate: 8.8 ng/mL (ref >5.9)

## 2019-03-28 LAB — HEMOGLOBIN A1C
Hgb A1c MFr Bld: 5.4 % (ref 4.8–5.6)
Mean Plasma Glucose: 108.28 mg/dL

## 2019-03-28 LAB — VITAMIN B12: Vitamin B-12: 376 pg/mL (ref 180–914)

## 2019-03-28 LAB — FERRITIN: Ferritin: 6 ng/mL — ABNORMAL LOW (ref 11–307)

## 2019-03-28 MED ORDER — MORPHINE SULFATE (PF) 2 MG/ML IV SOLN
1.0000 mg | INTRAVENOUS | Status: DC | PRN
  Administered 2019-03-28: 2 mg via INTRAVENOUS
  Filled 2019-03-28: qty 1

## 2019-03-28 MED ORDER — PANTOPRAZOLE SODIUM 40 MG PO TBEC: 40.0000 mg | DELAYED_RELEASE_TABLET | Freq: Every day | ORAL | 2 refills | Status: DC

## 2019-03-28 MED ORDER — SODIUM CHLORIDE 0.9 % IV SOLN
200.0000 mg | INTRAVENOUS | Status: DC
  Administered 2019-03-28: 200 mg via INTRAVENOUS
  Filled 2019-03-28: qty 10

## 2019-03-28 MED ORDER — FERROUS SULFATE 325 (65 FE) MG PO TABS: 325.0000 mg | ORAL_TABLET | Freq: Two times a day (BID) | ORAL | 3 refills | Status: DC

## 2019-03-28 MED ORDER — PANTOPRAZOLE SODIUM 40 MG PO TBEC
40.0000 mg | DELAYED_RELEASE_TABLET | Freq: Every day | ORAL | Status: DC
  Administered 2019-03-28: 40 mg via ORAL
  Filled 2019-03-28: qty 1

## 2019-03-28 NOTE — Discharge Summary (Signed)
[facility] [place] NAME: Lori Bentley    MR#:  [id number]  DATE OF BIRTH:  January 31, 1986  DATE OF ADMISSION:  03/27/2019   ADMITTING PHYSICIAN: Demetrios Loll, MD  DATE OF DISCHARGE: 03/28/2019  PRIMARY CARE PHYSICIAN: Freddy Finner, NP   ADMISSION DIAGNOSIS:   Weakness [R53.1] Near syncope [R55] Gastrointestinal hemorrhage, unspecified gastrointestinal hemorrhage type [K92.2] GI bleed [K92.2]  DISCHARGE DIAGNOSIS:   Active Problems:   GIB (gastrointestinal bleeding)   GI bleed   SECONDARY DIAGNOSIS:   Past Medical History:  Diagnosis Date  . Anxiety   . Asthma    WELL CONTROLLED  . Chronic anemia   . Depression   . Diabetes ([place]) 12/2017   patient denies diabetes  . GERD (gastroesophageal reflux disease)   . History of blood transfusion 2018   GI BLEED  . History of hiatal hernia     HOSPITAL COURSE:   33 year old female with past medical history significant for known iron deficiency anemia, dysmenorrhea, asthma, diabetes and depression presents to hospital secondary to near syncopal episode.  1.  Near syncope-secondary to anemia -Hemoglobin was noted to be at 5.8.  No hypotension noted. -Patient has not had further syncopal episodes or lightheadedness in the hospital  2.  Acute on chronic anemia-denies any active bleeding.  Severe iron deficiency anemia noted on labs. -Has had prior history of IV iron infusions. -Denies any hematemesis, had dark stools yesterday but also mentions that she was taking iron pills. -Hemoglobin on admission was 5.8, received 2 units transfusion and hemoglobin is 7.7. -GI was initially consulted and patient was scheduled to have EGD, however patient refused to have inpatient EGD and wants to go home and get this done as outpatient -Anemia labs showing iron levels of only 14 and ferritin of 6. -Receiving IV iron 1 dose prior to discharge, recommended outpatient IV iron infusion set up.   Continue to take oral iron supplements.  3.  Depression-continue home medications.  Patient on Latuda and Lexapro  4.  GERD-Protonix  Patient is independent at baseline and will be discharged home today  DISCHARGE CONDITIONS:   Guarded  CONSULTS OBTAINED:   None  DRUG ALLERGIES:   Allergies  Allergen Reactions  . Nsaids     GI BLEED-HAD BLOOD TRASNFUSION  . Peanut-Containing Drug Products Itching    Itching in throat  . Percocet [Oxycodone-Acetaminophen] Swelling    Swelling of face and throat  . Phenylephrine-Guaifenesin Other (See Comments)    Unknown reaction. Does not remember reaction   DISCHARGE MEDICATIONS:   Allergies as of 03/28/2019      Reactions   Nsaids    GI BLEED-HAD BLOOD TRASNFUSION   Peanut-containing Drug Products Itching   Itching in throat   Percocet [oxycodone-acetaminophen] Swelling   Swelling of face and throat   Phenylephrine-guaifenesin Other (See Comments)   Unknown reaction. Does not remember reaction      Medication List    TAKE these medications   albuterol 108 (90 Base) MCG/ACT inhaler Commonly known as: VENTOLIN HFA Inhale 2 puffs into the lungs every 6 (six) hours as needed for wheezing or shortness of breath.   escitalopram 20 MG tablet Commonly known as: LEXAPRO Take 20 mg by mouth every morning.   eszopiclone 2 MG Tabs tablet Commonly known as: LUNESTA Take 2 mg by mouth at bedtime as needed for sleep. Take immediately before bedtime   ferrous sulfate 325 (65 FE) MG tablet Take 1 tablet (  325 mg total) by mouth 2 (two) times daily with a meal.   Latuda 20 MG Tabs tablet Generic drug: lurasidone Take 20 mg by mouth daily.   pantoprazole 40 MG tablet Commonly known as: PROTONIX Take 1 tablet (40 mg total) by mouth daily. What changed: when to take this        DISCHARGE INSTRUCTIONS:   1.  PCP follow-up in 1 to 2 weeks 2.  GI follow-up in 2 weeks  DIET:   Regular diet  ACTIVITY:   Activity as tolerated   OXYGEN:   Home Oxygen: No.  Oxygen Delivery: room air  DISCHARGE LOCATION:   home   If you experience worsening of your admission symptoms, develop shortness of breath, life threatening emergency, suicidal or homicidal thoughts you must seek medical attention immediately by calling 911 or calling your MD immediately  if symptoms less severe.  You Must read complete instructions/literature along with all the possible adverse reactions/side effects for all the Medicines you take and that have been prescribed to you. Take any new Medicines after you have completely understood and accpet all the possible adverse reactions/side effects.   Please note  You were cared for by a hospitalist during your hospital stay. If you have any questions about your discharge medications or the care you received while you were in the hospital after you are discharged, you can call the unit and asked to speak with the hospitalist on call if the hospitalist that took care of you is not available. Once you are discharged, your primary care physician will handle any further medical issues. Please note that NO REFILLS for any discharge medications will be authorized once you are discharged, as it is imperative that you return to your primary care physician (or establish a relationship with a primary care physician if you do not have one) for your aftercare needs so that they can reassess your need for medications and monitor your lab values.    On the day of Discharge:  VITAL SIGNS:   Blood pressure 118/69, pulse 75, temperature 97.8 F (36.6 C), resp. rate 16, height 5' (1.524 m), weight 81.6 kg, last menstrual period 02/24/2019, SpO2 99 %.  PHYSICAL EXAMINATION:    GENERAL:  33 y.o.-year-old obese patient lying in the bed with no acute distress.  EYES: Pupils equal, round, reactive to light and accommodation. No scleral icterus. Extraocular muscles intact.  HEENT: Head atraumatic, normocephalic. Oropharynx  and nasopharynx clear.  NECK:  Supple, no jugular venous distention. No thyroid enlargement, no tenderness.  LUNGS: Normal breath sounds bilaterally, no wheezing, rales,rhonchi or crepitation. No use of accessory muscles of respiration.  CARDIOVASCULAR: S1, S2 normal. No murmurs, rubs, or gallops.  ABDOMEN: Soft, non-tender, non-distended. Bowel sounds present. No organomegaly or mass.  EXTREMITIES: No pedal edema, cyanosis, or clubbing.  NEUROLOGIC: Cranial nerves II through XII are intact. Muscle strength 5/5 in all extremities. Sensation intact. Gait not checked.  PSYCHIATRIC: The patient is alert and oriented x 3.  SKIN: No obvious rash, lesion, or ulcer.   DATA REVIEW:   CBC Recent Labs  Lab 03/28/19 0842 03/28/19 1213  WBC 7.7  --   HGB 7.7* 7.6*  HCT 25.3* 25.2*  PLT 286  --     Chemistries  Recent Labs  Lab 03/28/19 0842  NA 137  K 3.5  CL 108  CO2 23  GLUCOSE 99  BUN 10  CREATININE 0.67  CALCIUM 8.0*     Microbiology Results  Results for  orders placed or performed during the hospital encounter of 03/27/19  SARS Coronavirus 2 [facility] order, Performed in [place] hospital lab)     Status: None   Collection Time: 03/27/19  7:37 PM  Result Value Ref Range Status   SARS Coronavirus 2 NEGATIVE NEGATIVE Final    Comment: (NOTE) If result is NEGATIVE SARS-CoV-2 target nucleic acids are NOT DETECTED. The SARS-CoV-2 RNA is generally detectable in upper and lower  respiratory specimens during the acute phase of infection. The lowest  concentration of SARS-CoV-2 viral copies this assay can detect is 250  copies / mL. A negative result does not preclude SARS-CoV-2 infection  and should not be used as the sole basis for treatment or other  patient management decisions.  A negative result may occur with  improper specimen collection / handling, submission of specimen other  than nasopharyngeal swab, presence of viral mutation(s) within the  areas targeted by this  assay, and inadequate number of viral copies  (<250 copies / mL). A negative result must be combined with clinical  observations, patient history, and epidemiological information. If result is POSITIVE SARS-CoV-2 target nucleic acids are DETECTED. The SARS-CoV-2 RNA is generally detectable in upper and lower  respiratory specimens dur ing the acute phase of infection.  Positive  results are indicative of active infection with SARS-CoV-2.  Clinical  correlation with patient history and other diagnostic information is  necessary to determine patient infection status.  Positive results do  not rule out bacterial infection or co-infection with other viruses. If result is PRESUMPTIVE POSTIVE SARS-CoV-2 nucleic acids MAY BE PRESENT.   A presumptive positive result was obtained on the submitted specimen  and confirmed on repeat testing.  While 2019 novel coronavirus  (SARS-CoV-2) nucleic acids may be present in the submitted sample  additional confirmatory testing may be necessary for epidemiological  and / or clinical management purposes  to differentiate between  SARS-CoV-2 and other Sarbecovirus currently known to infect humans.  If clinically indicated additional testing with an alternate test  methodology [phone]) is advised. The SARS-CoV-2 RNA is generally  detectable in upper and lower respiratory sp ecimens during the acute  phase of infection. The expected result is Negative. Fact Sheet for Patients:  StrictlyIdeas.no Fact Sheet for Healthcare Providers: BankingDealers.co.za This test is not yet approved or cleared by the [place] FDA and has been authorized for detection and/or diagnosis of SARS-CoV-2 by FDA under an Emergency Use Authorization (EUA).  This EUA will remain in effect (meaning this test can be used) for the duration of the COVID-19 declaration under Section 564(b)(1) of the Act, 21 U.S.C. section 360bbb-3(b)(1),  unless the authorization is terminated or revoked sooner. Performed at [facility], [address] [place], [place] [address]     RADIOLOGY:  Dg Chest Port 1 View  Result Date: 03/27/2019 CLINICAL DATA:  Syncope.  Vomiting and weakness. EXAM: PORTABLE CHEST 1 VIEW COMPARISON:  None. FINDINGS: The heart size and mediastinal contours are within normal limits. Both lungs are clear. The visualized skeletal structures are unremarkable. IMPRESSION: No active disease. Electronically Signed   By: Titus Dubin M.D.   On: 03/27/2019 19:15     Management plans discussed with the patient, family and they are in agreement.  CODE STATUS:     Code Status Orders  (From admission, onward)         Start     Ordered   03/27/19 2327  Full code  Continuous  03/27/19 2327        Code Status History    Date Active Date Inactive Code Status Order ID Comments User Context   01/02/2018 1615 01/03/2018 1702 Full Code [id number]  Gae Dry, MD Inpatient   04/23/2016 2231 04/25/2016 1906 Full Code [id number]  Lance Coon, MD Inpatient   Advance Care Planning Activity      TOTAL TIME TAKING CARE OF THIS PATIENT: 38 minutes.    Gladstone Lighter M.D on 03/28/2019 at 1:51 PM  Between 7am to 6pm - Pager - [phone]  After 6pm go to [email or web address] - Proofreader  [facility]  Office  [phone]  CC: Primary care physician; Freddy Finner, NP   Note: This dictation was prepared with Dragon dictation along with smaller phrase technology. Any transcriptional errors that result from this process are unintentional.

## 2019-03-28 NOTE — Progress Notes (Signed)
Patient being discharged home. IV's removed. Instructions given to patient, verbalized understanding. Patient's husband is to come transport patient home.

## 2019-03-29 LAB — TYPE AND SCREEN
ABO/RH(D): B NEG
Antibody Screen: NEGATIVE
Unit division: 0
Unit division: 0

## 2019-03-29 LAB — BPAM RBC
Blood Product Expiration Date: 202009222359
Blood Product Expiration Date: 202009222359
ISSUE DATE / TIME: 202008282041
ISSUE DATE / TIME: 202008290119
Unit Type and Rh: 1700
Unit Type and Rh: 1700

## 2019-03-31 LAB — HIV ANTIBODY (ROUTINE TESTING W REFLEX): HIV Screen 4th Generation wRfx: NONREACTIVE

## 2019-04-24 ENCOUNTER — Inpatient Hospital Stay: Payer: Medicaid Other | Attending: Oncology

## 2019-04-24 ENCOUNTER — Other Ambulatory Visit: Payer: Self-pay

## 2019-04-24 DIAGNOSIS — D500 Iron deficiency anemia secondary to blood loss (chronic): Secondary | ICD-10-CM

## 2019-05-05 ENCOUNTER — Inpatient Hospital Stay: Payer: Medicaid Other | Admitting: Oncology

## 2019-05-05 ENCOUNTER — Inpatient Hospital Stay: Payer: Medicaid Other

## 2019-05-28 ENCOUNTER — Ambulatory Visit: Payer: Medicaid Other | Admitting: Gastroenterology

## 2019-08-11 ENCOUNTER — Other Ambulatory Visit: Payer: Self-pay

## 2019-08-11 ENCOUNTER — Ambulatory Visit: Payer: Medicaid Other | Admitting: Gastroenterology

## 2019-08-11 VITALS — BP 117/86 | HR 85 | Temp 98.9°F | Ht 60.0 in | Wt 182.8 lb

## 2019-08-11 DIAGNOSIS — D509 Iron deficiency anemia, unspecified: Secondary | ICD-10-CM | POA: Diagnosis not present

## 2019-08-11 DIAGNOSIS — K219 Gastro-esophageal reflux disease without esophagitis: Secondary | ICD-10-CM

## 2019-08-11 NOTE — Progress Notes (Signed)
Lori Bellows MD, MRCP(U.K) [address]  [place]  [place], [place] [address]  Main: [phone]  Fax: [phone]   Gastroenterology Consultation  Referring Provider:     Freddy Finner, NP Primary Care Physician:  Freddy Finner, NP Primary Gastroenterologist:  Dr. Jonathon Bentley  Reason for Consultation:     Anemia        HPI:   Lori Bentley is a 34 y.o. y/o female referred for consultation & management  by Dr. Freddy Finner, NP.    She was referred back in 04/19/2019 for anemia.  She was admitted to the hospital in 03/19/2019 with near syncope secondary to anemia hemoglobin was noted to be 5.8 g.  Noted to have severe iron deficiency and given IV iron.  She also received a blood transfusion.  Patient did not have an EGD as recommended by GI and want to be done as an outpatient.  Ferritin was 6 at that time. No subsequent labs since then.  Since discharge I cannot see any new outpatient notes.  She has a history of menorrhagia for which she underwent a myomectomy of uterine fibroids in 01/16/2018  Looking back at the labs were back in 2017 as well she had microcytic anemia.  She was seen by gastroenterology with [facility].  Unclear if hematology referral and further GI work-up was actually performed   She denies any NSAID use.  Denies any use of any blood thinners.  Denies any blood in her stool, blood in the urine, excessive menstrual bleeding.  1 episode of hematemesis a few months back.  None since after.  Takes 20 mg of Protonix once a day in the morning.  Still has heartburn. Past Medical History:  Diagnosis Date  . Anxiety   . Asthma    WELL CONTROLLED  . Chronic anemia   . Depression   . Diabetes ([place]) 12/2017   patient denies diabetes  . GERD (gastroesophageal reflux disease)   . History of blood transfusion 2018   GI BLEED  . History of hiatal hernia     Past Surgical History:  Procedure Laterality Date  . ESOPHAGOGASTRODUODENOSCOPY (EGD) WITH PROPOFOL  N/A 05/18/2016   Procedure: ESOPHAGOGASTRODUODENOSCOPY (EGD) WITH PROPOFOL;  Surgeon: Manya Silvas, MD;  Location: [facility] ENDOSCOPY;  Service: Endoscopy;  Laterality: N/A;  . MYOMECTOMY N/A 01/02/2018   Procedure: MYOMECTOMY;  Surgeon: Gae Dry, MD;  Location: [facility] ORS;  Service: Gynecology;  Laterality: N/A;  . TUBAL LIGATION  2012    Prior to Admission medications   Medication Sig Start Date End Date Taking? Authorizing Provider  albuterol (PROVENTIL HFA;VENTOLIN HFA) 108 (90 Base) MCG/ACT inhaler Inhale 2 puffs into the lungs every 6 (six) hours as needed for wheezing or shortness of breath. 11/29/17   Darel Hong, MD  escitalopram (LEXAPRO) 20 MG tablet Take 20 mg by mouth every morning.  12/10/14   [provider]  eszopiclone (LUNESTA) 2 MG TABS tablet Take 2 mg by mouth at bedtime as needed for sleep. Take immediately before bedtime    [provider]  ferrous sulfate 325 (65 FE) MG tablet Take 1 tablet (325 mg total) by mouth 2 (two) times daily with a meal. 03/28/19   Gladstone Lighter, MD  lurasidone (LATUDA) 20 MG TABS tablet Take 20 mg by mouth daily.    [provider]  pantoprazole (PROTONIX) 40 MG tablet Take 1 tablet (40 mg total) by mouth daily. 03/28/19   Gladstone Lighter, MD    Family  History  Problem Relation Age of Onset  . Anxiety disorder Mother   . Asthma Mother   . Fibroids Mother   . GER disease Father      Social History   Tobacco Use  . Smoking status: Current Every Day Smoker    Packs/day: 0.25    Years: 1.00    Pack years: 0.25    Types: Cigarettes  . Smokeless tobacco: Never Used  Substance Use Topics  . Alcohol use: Not Currently  . Drug use: No    Allergies as of 08/11/2019 - Review Complete 03/27/2019  Allergen Reaction Noted  . Nsaids  12/26/2017  . Peanut-containing drug products Itching 01/02/2018  . Percocet [oxycodone-acetaminophen] Swelling 01/15/2015  . Phenylephrine-guaifenesin Other (See  Comments) 06/19/2012    Review of Systems:    All systems reviewed and negative except where noted in HPI.   Physical Exam:  There were no vitals taken for this visit. No LMP recorded. Psych:  Alert and cooperative. Normal mood and affect. General:   Alert,  Well-developed, well-nourished, pleasant and cooperative in NAD Head:  Normocephalic and atraumatic. Eyes:  Sclera clear, no icterus.   Conjunctiva pink. Neck:  Supple; no masses or thyromegaly. Lungs:  Respirations even and unlabored.  Clear throughout to auscultation.   No wheezes, crackles, or rhonchi. No acute distress. Heart:  Regular rate and rhythm; no murmurs, clicks, rubs, or gallops. Abdomen:  Normal bowel sounds.  No bruits.  Soft, non-tender and non-distended without masses, hepatosplenomegaly or hernias noted.  No guarding or rebound tenderness.    Neurologic:  Alert and oriented x3;  grossly normal neurologically. Skin:  Intact without significant lesions or rashes. No jaundice. Lymph Nodes:  No significant cervical adenopathy. Psych:  Alert and cooperative. Normal mood and affect.  Imaging Studies: No results found.  Assessment and Plan:   Lori Bentley is a 34 y.o. y/o female has been referred for iron deficiency anemia.  History of menorrhagia in 01/16/2018 for which she underwent myomectomy of uterine fibroids.  It is possible that she became iron deficient due to menorrhagia.  She also has a history of heartburn not well controlled on 20 mg of Protonix.  Plan 1.  Iron studies, B12, folate, celiac serology, urine analysis 2.  EGD and colonoscopy and if negative will need capsule study of the small bowel. 3.  If has iron deficiency will refer to hematology for IV iron. 4.  GERD.  Counseled on lifestyle changes including weight loss, avoid eating for 2 hours before bedtime.  Taking her PPI before meals.  Increasing the dose of her Protonix from 20 mg once a day to 20 mg twice daily.  If doing well with  the lifestyle changes we will plan to decrease the dose at next visit  I have discussed alternative options, risks & benefits,  which include, but are not limited to, bleeding, infection, perforation,respiratory complication & drug reaction.  The patient agrees with this plan & written consent will be obtained.    Follow up in 6 weeks telephone visit  Dr Lori Bellows MD,MRCP(U.K)

## 2019-08-12 ENCOUNTER — Other Ambulatory Visit: Payer: Self-pay

## 2019-08-12 DIAGNOSIS — D509 Iron deficiency anemia, unspecified: Secondary | ICD-10-CM

## 2019-08-12 LAB — H. PYLORI BREATH TEST: H pylori Breath Test: NEGATIVE

## 2019-08-12 MED ORDER — NA SULFATE-K SULFATE-MG SULF 17.5-3.13-1.6 GM/177ML PO SOLN: 1.0000 | Freq: Once | ORAL | 0 refills | Status: AC

## 2019-08-13 ENCOUNTER — Encounter: Payer: Self-pay | Admitting: Gastroenterology

## 2019-08-13 ENCOUNTER — Other Ambulatory Visit: Payer: Self-pay

## 2019-08-13 DIAGNOSIS — D509 Iron deficiency anemia, unspecified: Secondary | ICD-10-CM

## 2019-08-13 LAB — CELIAC DISEASE AB SCREEN W/RFX
Antigliadin Abs, IgA: 4 units (ref 0–19)
IgA/Immunoglobulin A, Serum: 213 mg/dL (ref 87–352)
Transglutaminase IgA: <2 U/mL (ref 0–3)

## 2019-08-13 LAB — IRON,TIBC AND FERRITIN PANEL
Ferritin: 13 ng/mL — ABNORMAL LOW (ref 15–150)
Iron Saturation: 14 % — ABNORMAL LOW (ref 15–55)
Iron: 56 ug/dL (ref 27–159)
Total Iron Binding Capacity: 412 ug/dL (ref 250–450)
UIBC: 356 ug/dL (ref 131–425)

## 2019-08-13 LAB — URINALYSIS
Bilirubin, UA: NEGATIVE
Glucose, UA: NEGATIVE
Ketones, UA: NEGATIVE
Leukocytes,UA: NEGATIVE
Nitrite, UA: NEGATIVE
Protein,UA: NEGATIVE
RBC, UA: NEGATIVE
Specific Gravity, UA: 1.018 (ref 1.005–1.030)
Urobilinogen, Ur: 0.2 mg/dL (ref 0.2–1.0)
pH, UA: 6 (ref 5.0–7.5)

## 2019-08-13 LAB — B12 AND FOLATE PANEL
Folate: 4.3 ng/mL (ref >3.0)
Vitamin B-12: 567 pg/mL (ref 232–1245)

## 2019-08-14 ENCOUNTER — Encounter: Payer: Self-pay | Admitting: Anesthesiology

## 2019-08-19 ENCOUNTER — Encounter: Payer: Self-pay | Admitting: Oncology

## 2019-08-19 ENCOUNTER — Inpatient Hospital Stay: Payer: Medicaid Other | Attending: Oncology

## 2019-08-19 ENCOUNTER — Other Ambulatory Visit: Payer: Self-pay

## 2019-08-19 DIAGNOSIS — F17210 Nicotine dependence, cigarettes, uncomplicated: Secondary | ICD-10-CM | POA: Diagnosis not present

## 2019-08-19 DIAGNOSIS — D509 Iron deficiency anemia, unspecified: Secondary | ICD-10-CM | POA: Diagnosis not present

## 2019-08-19 DIAGNOSIS — D500 Iron deficiency anemia secondary to blood loss (chronic): Secondary | ICD-10-CM

## 2019-08-19 LAB — CBC WITH DIFFERENTIAL/PLATELET
Abs Immature Granulocytes: 0.01 10*3/uL (ref 0.00–0.07)
Basophils Absolute: 0.1 10*3/uL (ref 0.0–0.1)
Basophils Relative: 1 %
Eosinophils Absolute: 0.5 10*3/uL (ref 0.0–0.5)
Eosinophils Relative: 9 %
HCT: 28.7 % — ABNORMAL LOW (ref 36.0–46.0)
Hemoglobin: 7.8 g/dL — ABNORMAL LOW (ref 12.0–15.0)
Immature Granulocytes: 0 %
Lymphocytes Relative: 36 %
Lymphs Abs: 2.2 10*3/uL (ref 0.7–4.0)
MCH: 21.3 pg — ABNORMAL LOW (ref 26.0–34.0)
MCHC: 27.2 g/dL — ABNORMAL LOW (ref 30.0–36.0)
MCV: 78.4 fL — ABNORMAL LOW (ref 80.0–100.0)
Monocytes Absolute: 0.4 10*3/uL (ref 0.1–1.0)
Monocytes Relative: 7 %
Neutro Abs: 2.9 10*3/uL (ref 1.7–7.7)
Neutrophils Relative %: 47 %
Platelets: 474 10*3/uL — ABNORMAL HIGH (ref 150–400)
RBC: 3.66 MIL/uL — ABNORMAL LOW (ref 3.87–5.11)
RDW: 19 % — ABNORMAL HIGH (ref 11.5–15.5)
WBC: 6.1 10*3/uL (ref 4.0–10.5)
nRBC: 0 % (ref 0.0–0.2)

## 2019-08-19 LAB — IRON AND TIBC
Iron: 11 ug/dL — ABNORMAL LOW (ref 28–170)
Saturation Ratios: 3 % — ABNORMAL LOW (ref 10.4–31.8)
TIBC: 398 ug/dL (ref 250–450)
UIBC: 387 ug/dL

## 2019-08-19 LAB — FERRITIN: Ferritin: 4 ng/mL — ABNORMAL LOW (ref 11–307)

## 2019-08-19 NOTE — Progress Notes (Signed)
Patient stated that she had been doing well with no concerns. Patient stated that she has her colonoscopy and EGD scheduled to be done on 08/25/2019.

## 2019-08-20 ENCOUNTER — Inpatient Hospital Stay (HOSPITAL_BASED_OUTPATIENT_CLINIC_OR_DEPARTMENT_OTHER): Payer: Medicaid Other | Admitting: Oncology

## 2019-08-20 ENCOUNTER — Inpatient Hospital Stay: Payer: Medicaid Other

## 2019-08-20 ENCOUNTER — Other Ambulatory Visit: Payer: Self-pay | Admitting: Oncology

## 2019-08-20 DIAGNOSIS — F17210 Nicotine dependence, cigarettes, uncomplicated: Secondary | ICD-10-CM | POA: Diagnosis not present

## 2019-08-20 DIAGNOSIS — D509 Iron deficiency anemia, unspecified: Secondary | ICD-10-CM | POA: Diagnosis not present

## 2019-08-21 ENCOUNTER — Other Ambulatory Visit: Payer: Self-pay

## 2019-08-21 ENCOUNTER — Other Ambulatory Visit
Admission: RE | Admit: 2019-08-21 | Discharge: 2019-08-21 | Disposition: A | Discharge status: Home / Self Care | Payer: Medicaid Other | Source: Ambulatory Visit | Attending: Gastroenterology | Admitting: Gastroenterology

## 2019-08-21 DIAGNOSIS — Z01812 Encounter for preprocedural laboratory examination: Secondary | ICD-10-CM | POA: Insufficient documentation

## 2019-08-21 DIAGNOSIS — Z20822 Contact with and (suspected) exposure to covid-19: Secondary | ICD-10-CM | POA: Insufficient documentation

## 2019-08-21 NOTE — Progress Notes (Signed)
I connected with Lori Bentley on 08/21/19 at 10:30 AM EST by video enabled telemedicine visit and verified that I am speaking with the correct person using two identifiers.   I discussed the limitations, risks, security and privacy concerns of performing an evaluation and management service by telemedicine and the availability of in-person appointments. I also discussed with the patient that there may be a patient responsible charge related to this service. The patient expressed understanding and agreed to proceed.  Other persons participating in the visit and their role in the encounter:  none  Patient's location:  home Provider's location:  work  Risk analyst Complaint: Routine follow-up of iron deficiency anemia lost to follow-up  Diagnosis: Iron deficiency anemia possibly secondary to menorrhagia  History of present illness: Patient is a 34 year old female with iron deficiency anemia that has been attributed to menorrhagia in the past.  She has received IV iron in the past But was then lost to follow-up in 2018.  She was recently admitted to the hospital for significant anemia with a hemoglobin of 7.8.  She was also seen by GI and is scheduled to undergo endoscopy and colonoscopy soon.  She has had surgery for her fibroids in 2019 and reports that since then her menstrual cycles are not as heavy as before but she still has first 1 or 2 days when her menstrual cycles would be heavy  Interval history currently reports ongoing fatigue.  Denies any consistent use of NSAIDs.  Denies any blood in her stool or urine.   Review of Systems  Constitutional: Positive for malaise/fatigue. Negative for chills, fever and weight loss.  HENT: Negative for congestion, ear discharge and nosebleeds.   Eyes: Negative for blurred vision.  Respiratory: Negative for cough, hemoptysis, sputum production, shortness of breath and wheezing.   Cardiovascular: Negative for chest pain, palpitations, orthopnea and  claudication.  Gastrointestinal: Negative for abdominal pain, blood in stool, constipation, diarrhea, heartburn, melena, nausea and vomiting.  Genitourinary: Negative for dysuria, flank pain, frequency, hematuria and urgency.  Musculoskeletal: Negative for back pain, joint pain and myalgias.  Skin: Negative for rash.  Neurological: Negative for dizziness, tingling, focal weakness, seizures, weakness and headaches.  Endo/Heme/Allergies: Does not bruise/bleed easily.  Psychiatric/Behavioral: Negative for depression and suicidal ideas. The patient does not have insomnia.     Allergies  Allergen Reactions  . Nsaids     GI BLEED-HAD BLOOD TRASNFUSION  . Peanut-Containing Drug Products Itching    Itching in throat  . Percocet [Oxycodone-Acetaminophen] Swelling    Swelling of face and throat  . Phenylephrine-Guaifenesin Other (See Comments)    Unknown reaction. Does not remember reaction    Past Medical History:  Diagnosis Date  . Anxiety   . Asthma    WELL CONTROLLED  . Bipolar affective disorder, currently manic, mild ([place])   . Chronic anemia   . Depression   . Diabetes ([place]) 12/2017   patient denies diabetes  . GERD (gastroesophageal reflux disease)   . History of blood transfusion 2018   GI BLEED  . History of hiatal hernia     Past Surgical History:  Procedure Laterality Date  . ESOPHAGOGASTRODUODENOSCOPY (EGD) WITH PROPOFOL N/A 05/18/2016   Procedure: ESOPHAGOGASTRODUODENOSCOPY (EGD) WITH PROPOFOL;  Surgeon: Manya Silvas, MD;  Location: [facility] ENDOSCOPY;  Service: Endoscopy;  Laterality: N/A;  . MYOMECTOMY N/A 01/02/2018   Procedure: MYOMECTOMY;  Surgeon: Gae Dry, MD;  Location: [facility] ORS;  Service: Gynecology;  Laterality: N/A;  . TUBAL LIGATION  2012  Social History   Socioeconomic History  . Marital status: Single    Spouse name: Not on file  . Number of children: Not on file  . Years of education: Not on file  . Highest education level: Not on file   Occupational History  . Not on file  Tobacco Use  . Smoking status: Current Every Day Smoker    Packs/day: 0.25    Years: 1.00    Pack years: 0.25    Types: Cigarettes  . Smokeless tobacco: Never Used  Substance and Sexual Activity  . Alcohol use: Yes    Comment: socially  . Drug use: Yes    Types: Marijuana    Comment: occasionally   . Sexual activity: Yes    Birth control/protection: Injection  Other Topics Concern  . Not on file  Social History Narrative  . Not on file   Social Determinants of Health   Financial Resource Strain:   . Difficulty of Paying Living Expenses: Not on file  Food Insecurity:   . Worried About Charity fundraiser in the Last Year: Not on file  . Ran Out of Food in the Last Year: Not on file  Transportation Needs:   . Lack of Transportation (Medical): Not on file  . Lack of Transportation (Non-Medical): Not on file  Physical Activity:   . Days of Exercise per Week: Not on file  . Minutes of Exercise per Session: Not on file  Stress:   . Feeling of Stress : Not on file  Social Connections:   . Frequency of Communication with Friends and Family: Not on file  . Frequency of Social Gatherings with Friends and Family: Not on file  . Attends Religious Services: Not on file  . Active Member of Clubs or Organizations: Not on file  . Attends Archivist Meetings: Not on file  . Marital Status: Not on file  Intimate Partner Violence:   . Fear of Current or Ex-Partner: Not on file  . Emotionally Abused: Not on file  . Physically Abused: Not on file  . Sexually Abused: Not on file    Family History  Problem Relation Age of Onset  . Anxiety disorder Mother   . Asthma Mother   . Fibroids Mother   . GER disease Father      Current Outpatient Medications:  .  albuterol (PROVENTIL HFA;VENTOLIN HFA) 108 (90 Base) MCG/ACT inhaler, Inhale 2 puffs into the lungs every 6 (six) hours as needed for wheezing or shortness of breath., Disp: 1  Inhaler, Rfl: 0 .  escitalopram (LEXAPRO) 20 MG tablet, Take 20 mg by mouth every morning. , Disp: , Rfl:  .  eszopiclone (LUNESTA) 2 MG TABS tablet, Take 2 mg by mouth at bedtime as needed for sleep. Take immediately before bedtime, Disp: , Rfl:  .  ferrous sulfate 325 (65 FE) MG tablet, Take 1 tablet (325 mg total) by mouth 2 (two) times daily with a meal., Disp: 60 tablet, Rfl: 3 .  lurasidone (LATUDA) 20 MG TABS tablet, Take 20 mg by mouth daily., Disp: , Rfl:  .  pantoprazole (PROTONIX) 40 MG tablet, Take 1 tablet (40 mg total) by mouth daily., Disp: 30 tablet, Rfl: 2  No results found.  No images are attached to the encounter.   CMP Latest Ref Rng & Units 03/28/2019  Glucose 70 - 99 mg/dL 99  BUN 6 - 20 mg/dL 10  Creatinine 0.44 - 1.00 mg/dL 0.67  Sodium 135 - 145  mmol/L 137  Potassium 3.5 - 5.1 mmol/L 3.5  Chloride 98 - 111 mmol/L 108  CO2 22 - 32 mmol/L 23  Calcium 8.9 - 10.3 mg/dL 8.0(L)  Total Protein 6.5 - 8.1 g/dL -  Total Bilirubin 0.3 - 1.2 mg/dL -  Alkaline Phos 38 - 126 U/L -  AST 15 - 41 U/L -  ALT 14 - 54 U/L -   CBC Latest Ref Rng & Units 08/19/2019  WBC 4.0 - 10.5 K/uL 6.1  Hemoglobin 12.0 - 15.0 g/dL 7.8(L)  Hematocrit 36.0 - 46.0 % 28.7(L)  Platelets 150 - 400 K/uL 474(H)     Observation/objective: Appears in no acute distress on video visit today.  Breathing is nonlabored  Assessment and plan: Patient is a 34 year old female with iron deficiency anemia possibly secondary to menorrhagia but GI work-up is currently ongoing  Patient is significantly anemic and her iron studies are indicative of severe iron deficiency anemia.  I would therefore want her to get 2 doses of Feraheme that she has received in the past and has tolerated it well without any significant side effects.  Discussed risks and benefits of Feraheme including all but not limited to nausea, leg swelling and possible risk of infusion reaction.  Patient understands and agrees to proceed as  planned.  Recent B12 and folate levels were normal.  Follow-up instructions: I will see her back in 2-1/2 months with repeat CBC ferritin and iron studies to see how she is responding to IV iron as well as follow-up on the results of her endoscopy  I discussed the assessment and treatment plan with the patient. The patient was provided an opportunity to ask questions and all were answered. The patient agreed with the plan and demonstrated an understanding of the instructions.   The patient was advised to call back or seek an in-person evaluation if the symptoms worsen or if the condition fails to improve as anticipated.   Visit Diagnosis: 1. Iron deficiency anemia, unspecified iron deficiency anemia type     Dr. Randa Evens, MD, MPH [facility] Tel- [phone] 08/21/2019 12:24 PM

## 2019-08-22 LAB — SARS CORONAVIRUS 2 (TAT 6-24 HRS): SARS Coronavirus 2: NEGATIVE

## 2019-08-23 ENCOUNTER — Encounter: Payer: Self-pay | Admitting: Gastroenterology

## 2019-08-24 ENCOUNTER — Telehealth: Payer: Self-pay

## 2019-08-24 DIAGNOSIS — D509 Iron deficiency anemia, unspecified: Secondary | ICD-10-CM

## 2019-08-24 NOTE — Telephone Encounter (Signed)
-----   Message from [name], MD sent at 08/23/2019 10:41 AM EST ----- Sherald Hess inform iron is low - refer for IV iron to Dr Tasia Catchings -failed oral iron   Dr Vicente Males

## 2019-08-24 NOTE — Telephone Encounter (Signed)
Patient verbalized understanding of lab results. Put in referral to Dr. Tasia Catchings

## 2019-08-24 NOTE — Discharge Instructions (Signed)
General Anesthesia, Adult, Care After This sheet gives you information about how to care for yourself after your procedure. Your health care provider may also give you more specific instructions. If you have problems or questions, contact your health care provider. What can I expect after the procedure? After the procedure, the following side effects are common:  Pain or discomfort at the IV site.  Nausea.  Vomiting.  Sore throat.  Trouble concentrating.  Feeling cold or chills.  Weak or tired.  Sleepiness and fatigue.  Soreness and body aches. These side effects can affect parts of the body that were not involved in surgery. Follow these instructions at home:  For at least 24 hours after the procedure:  Have a responsible adult stay with you. It is important to have someone help care for you until you are awake and alert.  Rest as needed.  Do not: ? Participate in activities in which you could fall or become injured. ? Drive. ? Use heavy machinery. ? Drink alcohol. ? Take sleeping pills or medicines that cause drowsiness. ? Make important decisions or sign legal documents. ? Take care of children on your own. Eating and drinking  Follow any instructions from your health care provider about eating or drinking restrictions.  When you feel hungry, start by eating small amounts of foods that are soft and easy to digest (bland), such as toast. Gradually return to your regular diet.  Drink enough fluid to keep your urine pale yellow.  If you vomit, rehydrate by drinking water, juice, or clear broth. General instructions  If you have sleep apnea, surgery and certain medicines can increase your risk for breathing problems. Follow instructions from your health care provider about wearing your sleep device: ? Anytime you are sleeping, including during daytime naps. ? While taking prescription pain medicines, sleeping medicines, or medicines that make you drowsy.  Return to  your normal activities as told by your health care provider. Ask your health care provider what activities are safe for you.  Take over-the-counter and prescription medicines only as told by your health care provider.  If you smoke, do not smoke without supervision.  Keep all follow-up visits as told by your health care provider. This is important. Contact a health care provider if:  You have nausea or vomiting that does not get better with medicine.  You cannot eat or drink without vomiting.  You have pain that does not get better with medicine.  You are unable to pass urine.  You develop a skin rash.  You have a fever.  You have redness around your IV site that gets worse. Get help right away if:  You have difficulty breathing.  You have chest pain.  You have blood in your urine or stool, or you vomit blood. Summary  After the procedure, it is common to have a sore throat or nausea. It is also common to feel tired.  Have a responsible adult stay with you for the first 24 hours after general anesthesia. It is important to have someone help care for you until you are awake and alert.  When you feel hungry, start by eating small amounts of foods that are soft and easy to digest (bland), such as toast. Gradually return to your regular diet.  Drink enough fluid to keep your urine pale yellow.  Return to your normal activities as told by your health care provider. Ask your health care provider what activities are safe for you. This information is not   intended to replace advice given to you by your health care provider. Make sure you discuss any questions you have with your health care provider. Document Revised: 07/19/2017 Document Reviewed: 03/01/2017 Elsevier Patient Education  2020 [employer]vier Inc[employer]  

## 2019-08-24 NOTE — Anesthesia Preprocedure Evaluation (Deleted)
Anesthesia Evaluation    Airway        Dental   Pulmonary asthma , Current Smoker and Patient abstained from smoking.,           Cardiovascular      Neuro/Psych  Headaches, PSYCHIATRIC DISORDERS Anxiety Depression Bipolar Disorder    GI/Hepatic hiatal hernia, GERD  ,  Endo/Other  diabetes  Renal/GU      Musculoskeletal   Abdominal   Peds  Hematology  (+) anemia ,   Anesthesia Other Findings   Reproductive/Obstetrics                             Anesthesia Physical Anesthesia Plan  ASA: II  Anesthesia Plan: General   Post-op Pain Management:    Induction: Intravenous  PONV Risk Score and Plan: 2 and Propofol infusion, TIVA and Treatment may vary due to age or medical condition  Airway Management Planned: Natural Airway and Nasal Cannula  Additional Equipment:   Intra-op Plan:   Post-operative Plan:   Informed Consent: I have reviewed the patients History and Physical, chart, labs and discussed the procedure including the risks, benefits and alternatives for the proposed anesthesia with the patient or authorized representative who has indicated his/her understanding and acceptance.       Plan Discussed with: CRNA and Anesthesiologist  Anesthesia Plan Comments:         Anesthesia Quick Evaluation

## 2019-08-25 ENCOUNTER — Ambulatory Visit
Admission: RE | Admit: 2019-08-25 | Discharge: 2019-08-25 | Disposition: A | Discharge status: Home / Self Care | Payer: Medicaid Other | Attending: Gastroenterology | Admitting: Gastroenterology

## 2019-08-25 ENCOUNTER — Encounter
Admission: RE | Disposition: A | Discharge status: Home / Self Care | Payer: Self-pay | Source: Home / Self Care | Attending: Gastroenterology

## 2019-08-25 ENCOUNTER — Other Ambulatory Visit: Payer: Self-pay

## 2019-08-25 ENCOUNTER — Encounter: Payer: Self-pay | Admitting: Gastroenterology

## 2019-08-25 DIAGNOSIS — Z5309 Procedure and treatment not carried out because of other contraindication: Secondary | ICD-10-CM | POA: Diagnosis not present

## 2019-08-25 DIAGNOSIS — D509 Iron deficiency anemia, unspecified: Secondary | ICD-10-CM | POA: Insufficient documentation

## 2019-08-25 HISTORY — DX: Bipolar disorder, current episode manic without psychotic features, mild: F31.11

## 2019-08-25 LAB — POCT PREGNANCY, URINE: Preg Test, Ur: NEGATIVE

## 2019-08-25 SURGERY — COLONOSCOPY WITH PROPOFOL
Anesthesia: Choice

## 2019-08-25 MED ORDER — LACTATED RINGERS IV SOLN: 100.0000 mL/h | INTRAVENOUS | Status: DC

## 2019-08-25 SURGICAL SUPPLY — 36 items
BALLN DILATOR 10-12 8 (BALLOONS)
BALLN DILATOR 12-15 8 (BALLOONS)
BALLN DILATOR 15-18 8 (BALLOONS)
BALLN DILATOR CRE 0-12 8 (BALLOONS)
BALLN DILATOR ESOPH 8 10 CRE (MISCELLANEOUS) IMPLANT
BALLOON DILATOR 12-15 8 (BALLOONS) IMPLANT
BALLOON DILATOR 15-18 8 (BALLOONS) IMPLANT
BALLOON DILATOR CRE 0-12 8 (BALLOONS) IMPLANT
BLOCK BITE 60FR ADLT L/F GRN (MISCELLANEOUS) ×2 IMPLANT
CANISTER SUCT 1200ML W/VALVE (MISCELLANEOUS) ×2 IMPLANT
CLIP HMST 235XBRD CATH ROT (MISCELLANEOUS) IMPLANT
CLIP RESOLUTION 360 11X235 (MISCELLANEOUS)
ELECT REM PT RETURN 9FT ADLT (ELECTROSURGICAL)
ELECTRODE REM PT RTRN 9FT ADLT (ELECTROSURGICAL) IMPLANT
FCP ESCP3.2XJMB 240X2.8X (MISCELLANEOUS)
FORCEPS BIOP RAD 4 LRG CAP 4 (CUTTING FORCEPS) IMPLANT
FORCEPS BIOP RJ4 240 W/NDL (MISCELLANEOUS)
FORCEPS ESCP3.2XJMB 240X2.8X (MISCELLANEOUS) IMPLANT
GOWN CVR UNV OPN BCK APRN NK (MISCELLANEOUS) ×2 IMPLANT
GOWN ISOL THUMB LOOP REG UNIV (MISCELLANEOUS) ×2
INJECTOR VARIJECT VIN23 (MISCELLANEOUS) IMPLANT
KIT DEFENDO VALVE AND CONN (KITS) IMPLANT
KIT ENDO PROCEDURE OLY (KITS) ×2 IMPLANT
MARKER SPOT ENDO TATTOO 5ML (MISCELLANEOUS) IMPLANT
PROBE APC STR FIRE (PROBE) IMPLANT
RETRIEVER NET PLAT FOOD (MISCELLANEOUS) IMPLANT
RETRIEVER NET ROTH 2.5X230 LF (MISCELLANEOUS) IMPLANT
SNARE SHORT THROW 13M SML OVAL (MISCELLANEOUS) IMPLANT
SNARE SHORT THROW 30M LRG OVAL (MISCELLANEOUS) IMPLANT
SNARE SNG USE RND 15MM (INSTRUMENTS) IMPLANT
SPOT EX ENDOSCOPIC TATTOO (MISCELLANEOUS)
SYR INFLATION 60ML (SYRINGE) IMPLANT
TRAP ETRAP POLY (MISCELLANEOUS) IMPLANT
VARIJECT INJECTOR VIN23 (MISCELLANEOUS)
WATER STERILE IRR 250ML POUR (IV SOLUTION) ×2 IMPLANT
WIRE CRE 18-20MM 8CM F G (MISCELLANEOUS) IMPLANT

## 2019-08-25 NOTE — Progress Notes (Signed)
Patient did not finish prep and stated her stools were still brown. MD Vicente Males was informed and he cancelled procedure.

## 2019-08-26 ENCOUNTER — Other Ambulatory Visit: Payer: Self-pay

## 2019-08-26 ENCOUNTER — Telehealth: Payer: Self-pay | Admitting: Gastroenterology

## 2019-08-26 DIAGNOSIS — D509 Iron deficiency anemia, unspecified: Secondary | ICD-10-CM

## 2019-08-26 MED ORDER — NA SULFATE-K SULFATE-MG SULF 17.5-3.13-1.6 GM/177ML PO SOLN: 354.0000 mL | Freq: Once | ORAL | 0 refills | Status: AC

## 2019-08-26 NOTE — Telephone Encounter (Signed)
Scheduled patient for procedure on 09/18/2019 with Dr. Vicente Males at [facility]. Sent prep to the pharmacy and went over new instructions mailed them and sent to [facility]

## 2019-08-26 NOTE — Telephone Encounter (Signed)
Patient is calling to schedule with Dr Vicente Males a colonoscopy & EGD.

## 2019-08-27 ENCOUNTER — Inpatient Hospital Stay: Payer: Medicaid Other

## 2019-08-27 ENCOUNTER — Other Ambulatory Visit: Payer: Self-pay

## 2019-08-27 DIAGNOSIS — D509 Iron deficiency anemia, unspecified: Secondary | ICD-10-CM | POA: Diagnosis not present

## 2019-08-27 DIAGNOSIS — D500 Iron deficiency anemia secondary to blood loss (chronic): Secondary | ICD-10-CM

## 2019-08-27 MED ORDER — SODIUM CHLORIDE 0.9 % IV SOLN
Freq: Once | INTRAVENOUS | Status: AC
  Filled 2019-08-27: qty 250

## 2019-08-27 MED ORDER — SODIUM CHLORIDE 0.9 % IV SOLN
510.0000 mg | Freq: Once | INTRAVENOUS | Status: AC
  Administered 2019-08-27: 14:00:00 510 mg via INTRAVENOUS
  Filled 2019-08-27: qty 510

## 2019-09-02 ENCOUNTER — Other Ambulatory Visit: Payer: Self-pay

## 2019-09-03 ENCOUNTER — Inpatient Hospital Stay: Payer: Medicaid Other | Attending: Oncology

## 2019-09-03 ENCOUNTER — Other Ambulatory Visit: Payer: Self-pay

## 2019-09-03 VITALS — BP 126/85 | HR 77 | Temp 97.3°F | Resp 20

## 2019-09-03 DIAGNOSIS — D500 Iron deficiency anemia secondary to blood loss (chronic): Secondary | ICD-10-CM

## 2019-09-03 DIAGNOSIS — D509 Iron deficiency anemia, unspecified: Secondary | ICD-10-CM | POA: Insufficient documentation

## 2019-09-03 MED ORDER — SODIUM CHLORIDE 0.9 % IV SOLN
510.0000 mg | Freq: Once | INTRAVENOUS | Status: AC
  Administered 2019-09-03: 510 mg via INTRAVENOUS
  Filled 2019-09-03: qty 17

## 2019-09-03 MED ORDER — SODIUM CHLORIDE 0.9 % IV SOLN
Freq: Once | INTRAVENOUS | Status: AC
  Filled 2019-09-03: qty 250

## 2019-09-07 ENCOUNTER — Other Ambulatory Visit: Payer: Self-pay | Admitting: Gastroenterology

## 2019-09-16 ENCOUNTER — Other Ambulatory Visit
Admission: RE | Admit: 2019-09-16 | Discharge: 2019-09-16 | Disposition: A | Discharge status: Home / Self Care | Payer: Medicaid Other | Source: Ambulatory Visit | Attending: Gastroenterology | Admitting: Gastroenterology

## 2019-09-16 DIAGNOSIS — Z01812 Encounter for preprocedural laboratory examination: Secondary | ICD-10-CM | POA: Insufficient documentation

## 2019-09-16 DIAGNOSIS — Z20822 Contact with and (suspected) exposure to covid-19: Secondary | ICD-10-CM | POA: Diagnosis not present

## 2019-09-17 ENCOUNTER — Encounter: Payer: Self-pay | Admitting: Gastroenterology

## 2019-09-17 LAB — SARS CORONAVIRUS 2 (TAT 6-24 HRS): SARS Coronavirus 2: NEGATIVE

## 2019-09-18 ENCOUNTER — Encounter
Admission: RE | Disposition: A | Discharge status: Home / Self Care | Payer: Self-pay | Source: Home / Self Care | Attending: Gastroenterology

## 2019-09-18 ENCOUNTER — Other Ambulatory Visit: Payer: Self-pay

## 2019-09-18 ENCOUNTER — Ambulatory Visit: Payer: Medicaid Other | Admitting: Anesthesiology

## 2019-09-18 ENCOUNTER — Ambulatory Visit
Admission: RE | Admit: 2019-09-18 | Discharge: 2019-09-18 | Disposition: A | Discharge status: Home / Self Care | Payer: Medicaid Other | Attending: Gastroenterology | Admitting: Gastroenterology

## 2019-09-18 DIAGNOSIS — Z79899 Other long term (current) drug therapy: Secondary | ICD-10-CM | POA: Diagnosis not present

## 2019-09-18 DIAGNOSIS — F17210 Nicotine dependence, cigarettes, uncomplicated: Secondary | ICD-10-CM | POA: Diagnosis not present

## 2019-09-18 DIAGNOSIS — Z888 Allergy status to other drugs, medicaments and biological substances status: Secondary | ICD-10-CM | POA: Insufficient documentation

## 2019-09-18 DIAGNOSIS — Z885 Allergy status to narcotic agent status: Secondary | ICD-10-CM | POA: Diagnosis not present

## 2019-09-18 DIAGNOSIS — K449 Diaphragmatic hernia without obstruction or gangrene: Secondary | ICD-10-CM

## 2019-09-18 DIAGNOSIS — J45909 Unspecified asthma, uncomplicated: Secondary | ICD-10-CM | POA: Diagnosis not present

## 2019-09-18 DIAGNOSIS — Z91010 Allergy to peanuts: Secondary | ICD-10-CM | POA: Insufficient documentation

## 2019-09-18 DIAGNOSIS — D509 Iron deficiency anemia, unspecified: Secondary | ICD-10-CM

## 2019-09-18 DIAGNOSIS — K219 Gastro-esophageal reflux disease without esophagitis: Secondary | ICD-10-CM | POA: Diagnosis not present

## 2019-09-18 DIAGNOSIS — Z886 Allergy status to analgesic agent status: Secondary | ICD-10-CM | POA: Insufficient documentation

## 2019-09-18 DIAGNOSIS — F319 Bipolar disorder, unspecified: Secondary | ICD-10-CM | POA: Diagnosis not present

## 2019-09-18 HISTORY — PX: COLONOSCOPY WITH PROPOFOL: SHX5780

## 2019-09-18 HISTORY — PX: ESOPHAGOGASTRODUODENOSCOPY (EGD) WITH PROPOFOL: SHX5813

## 2019-09-18 SURGERY — COLONOSCOPY WITH PROPOFOL
Anesthesia: General

## 2019-09-18 MED ORDER — MIDAZOLAM HCL 5 MG/5ML IJ SOLN
INTRAMUSCULAR | Status: DC | PRN
  Administered 2019-09-18: 2 mg via INTRAVENOUS

## 2019-09-18 MED ORDER — PROPOFOL 10 MG/ML IV BOLUS
INTRAVENOUS | Status: DC | PRN
  Administered 2019-09-18: 30 mg via INTRAVENOUS
  Administered 2019-09-18: 20 mg via INTRAVENOUS

## 2019-09-18 MED ORDER — MIDAZOLAM HCL 2 MG/2ML IJ SOLN
INTRAMUSCULAR | Status: AC
  Filled 2019-09-18: qty 2

## 2019-09-18 MED ORDER — GLYCOPYRROLATE 0.2 MG/ML IJ SOLN
INTRAMUSCULAR | Status: DC | PRN
  Administered 2019-09-18 (×2): .2 mg via INTRAVENOUS

## 2019-09-18 MED ORDER — SODIUM CHLORIDE 0.9 % IV SOLN
INTRAVENOUS | Status: DC
  Administered 2019-09-18: 1000 mL via INTRAVENOUS

## 2019-09-18 MED ORDER — PROPOFOL 500 MG/50ML IV EMUL
INTRAVENOUS | Status: DC | PRN
  Administered 2019-09-18: 50 ug/kg/min via INTRAVENOUS
  Administered 2019-09-18: 75 ug/kg/min via INTRAVENOUS

## 2019-09-18 MED ORDER — PROPOFOL 500 MG/50ML IV EMUL
INTRAVENOUS | Status: AC
  Filled 2019-09-18: qty 50

## 2019-09-18 MED ORDER — FENTANYL CITRATE (PF) 100 MCG/2ML IJ SOLN
INTRAMUSCULAR | Status: DC | PRN
  Administered 2019-09-18 (×2): 50 ug via INTRAVENOUS

## 2019-09-18 MED ORDER — LIDOCAINE HCL (PF) 2 % IJ SOLN
INTRAMUSCULAR | Status: DC | PRN
  Administered 2019-09-18: 100 mg

## 2019-09-18 MED ORDER — PHENYLEPHRINE HCL (PRESSORS) 10 MG/ML IV SOLN
INTRAVENOUS | Status: DC | PRN
  Administered 2019-09-18 (×2): 100 ug via INTRAVENOUS

## 2019-09-18 MED ORDER — FENTANYL CITRATE (PF) 100 MCG/2ML IJ SOLN
INTRAMUSCULAR | Status: AC
  Filled 2019-09-18: qty 2

## 2019-09-18 NOTE — H&P (Signed)
[name], MD [address] [place], [place], [place], [address] [address], [place], [place], [place], [address] Phone: [phone]  Fax: [phone]  Primary Care Physician:  Freddy Finner, NP   Pre-Procedure History & Physical: HPI:  Denylah Pelter Feltes is a 34 y.o. female is here for an endoscopy and colonoscopy    Past Medical History:  Diagnosis Date  . Anxiety   . Asthma    WELL CONTROLLED  . Bipolar affective disorder, currently manic, mild ([place])   . Chronic anemia   . Depression   . Diabetes ([place]) 12/2017   patient denies diabetes  . GERD (gastroesophageal reflux disease)   . History of blood transfusion 2018   GI BLEED  . History of hiatal hernia     Past Surgical History:  Procedure Laterality Date  . ESOPHAGOGASTRODUODENOSCOPY (EGD) WITH PROPOFOL N/A 05/18/2016   Procedure: ESOPHAGOGASTRODUODENOSCOPY (EGD) WITH PROPOFOL;  Surgeon: Manya Silvas, MD;  Location: [facility] ENDOSCOPY;  Service: Endoscopy;  Laterality: N/A;  . MYOMECTOMY N/A 01/02/2018   Procedure: MYOMECTOMY;  Surgeon: Gae Dry, MD;  Location: [facility] ORS;  Service: Gynecology;  Laterality: N/A;  . TUBAL LIGATION  2012    Prior to Admission medications   Medication Sig Start Date End Date Taking? Authorizing Provider  albuterol (PROVENTIL HFA;VENTOLIN HFA) 108 (90 Base) MCG/ACT inhaler Inhale 2 puffs into the lungs every 6 (six) hours as needed for wheezing or shortness of breath. 11/29/17  Yes Darel Hong, MD  escitalopram (LEXAPRO) 20 MG tablet Take 20 mg by mouth every morning.  12/10/14  Yes [provider]  eszopiclone (LUNESTA) 2 MG TABS tablet Take 2 mg by mouth at bedtime as needed for sleep. Take immediately before bedtime   Yes [provider]  ferrous sulfate 325 (65 FE) MG tablet Take 1 tablet (325 mg total) by mouth 2 (two) times daily with a meal. 03/28/19  Yes Gladstone Lighter, MD  lurasidone (LATUDA) 20 MG TABS tablet Take 20 mg  by mouth daily.   Yes [provider]  pantoprazole (PROTONIX) 40 MG tablet TAKE 1 TABLET BY MOUTH DAILY 09/07/19  Yes [name], MD    Allergies as of 08/26/2019 - Review Complete 08/25/2019  Allergen Reaction Noted  . Nsaids  12/26/2017  . Peanut-containing drug products Itching 01/02/2018  . Percocet [oxycodone-acetaminophen] Swelling 01/15/2015  . Phenylephrine-guaifenesin Other (See Comments) 06/19/2012    Family History  Problem Relation Age of Onset  . Anxiety disorder Mother   . Asthma Mother   . Fibroids Mother   . GER disease Father     Social History   Socioeconomic History  . Marital status: Single    Spouse name: Not on file  . Number of children: Not on file  . Years of education: Not on file  . Highest education level: Not on file  Occupational History  . Not on file  Tobacco Use  . Smoking status: Current Every Day Smoker    Packs/day: 0.25    Years: 1.00    Pack years: 0.25    Types: Cigarettes  . Smokeless tobacco: Never Used  Substance and Sexual Activity  . Alcohol use: Yes    Comment: socially  . Drug use: Yes    Types: Marijuana    Comment: occasionally   . Sexual activity: Yes    Birth control/protection: Injection  Other Topics Concern  . Not on file  Social History Narrative  .  Not on file   Social Determinants of Health   Financial Resource Strain:   . Difficulty of Paying Living Expenses: Not on file  Food Insecurity:   . Worried About Charity fundraiser in the Last Year: Not on file  . Ran Out of Food in the Last Year: Not on file  Transportation Needs:   . Lack of Transportation (Medical): Not on file  . Lack of Transportation (Non-Medical): Not on file  Physical Activity:   . Days of Exercise per Week: Not on file  . Minutes of Exercise per Session: Not on file  Stress:   . Feeling of Stress : Not on file  Social Connections:   . Frequency of Communication with Friends and Family: Not on file  . Frequency of  Social Gatherings with Friends and Family: Not on file  . Attends Religious Services: Not on file  . Active Member of Clubs or Organizations: Not on file  . Attends Archivist Meetings: Not on file  . Marital Status: Not on file  Intimate Partner Violence:   . Fear of Current or Ex-Partner: Not on file  . Emotionally Abused: Not on file  . Physically Abused: Not on file  . Sexually Abused: Not on file    Review of Systems: See HPI, otherwise negative ROS  Physical Exam: BP 119/84   Pulse 73   Temp (!) 97 F (36.1 C) (Temporal)   Resp 18   SpO2 100%  General:   Alert,  pleasant and cooperative in NAD Head:  Normocephalic and atraumatic. Neck:  Supple; no masses or thyromegaly. Lungs:  Clear throughout to auscultation, normal respiratory effort.    Heart:  +S1, +S2, Regular rate and rhythm, No edema. Abdomen:  Soft, nontender and nondistended. Normal bowel sounds, without guarding, and without rebound.   Neurologic:  Alert and  oriented x4;  grossly normal neurologically.  Impression/Plan: Katha Naquall Dettman is here for an endoscopy and colonoscopy  to be performed for  evaluation of iron deficiency anemia     Risks, benefits, limitations, and alternatives regarding endoscopy have been reviewed with the patient.  Questions have been answered.  All parties agreeable.   [name], MD  09/18/2019, 9:39 AM

## 2019-09-18 NOTE — Transfer of Care (Signed)
Immediate Anesthesia Transfer of Care Note  Patient: Sharlot Naquall Mulrooney  Procedure(s) Performed: COLONOSCOPY WITH PROPOFOL (N/A ) ESOPHAGOGASTRODUODENOSCOPY (EGD) WITH PROPOFOL (N/A )  Patient Location: PACU  Anesthesia Type:General  Level of Consciousness: sedated  Airway & Oxygen Therapy: Patient Spontanous Breathing and Patient connected to nasal cannula oxygen  Post-op Assessment: Report given to RN and Post -op Vital signs reviewed and stable  Post vital signs: Reviewed and stable  Last Vitals:  Vitals Value Taken Time  BP 89/53 09/18/19 1014  Temp 36.8 C 09/18/19 1013  Pulse 94 09/18/19 1015  Resp 15 09/18/19 1015  SpO2 99 % 09/18/19 1015  Vitals shown include unvalidated device data.  Last Pain:  Vitals:   09/18/19 1013  TempSrc: Temporal  PainSc: Asleep         Complications: No apparent anesthesia complications

## 2019-09-18 NOTE — Op Note (Signed)
[facility] Patient Name: Lori Bentley Procedure Date: 09/18/2019 9:39 AM MRN: [id number] Account #: [id number] Date of Birth: 08-Jun-1986 Admit Type: Outpatient Age: 34 Room: [facility] ENDO ROOM 3 Gender: Female Note Status: Finalized Procedure:             Upper GI endoscopy Indications:           Iron deficiency anemia Providers:             [name] MD, MD Referring MD:          Neomia Dear (Referring MD) Medicines:             Monitored Anesthesia Care Complications:         No immediate complications. Procedure:             Pre-Anesthesia Assessment:                        - Prior to the procedure, a History and Physical was                         performed, and patient medications, allergies and                         sensitivities were reviewed. The patient's tolerance                         of previous anesthesia was reviewed.                        - The risks and benefits of the procedure and the                         sedation options and risks were discussed with the                         patient. All questions were answered and informed                         consent was obtained.                        - ASA Grade Assessment: II - A patient with mild                         systemic disease.                        After obtaining informed consent, the endoscope was                         passed under direct vision. Throughout the procedure,                         the patient's blood pressure, pulse, and oxygen                         saturations were monitored continuously. The Endoscope                         was introduced through the mouth, and  advanced to the                         third part of duodenum. The upper GI endoscopy was                         accomplished with ease. The patient tolerated the                         procedure well. Findings:      The examined duodenum was normal.      The  esophagus was normal.      A large hiatal hernia was present.      The cardia and gastric fundus were normal on retroflexion. Impression:            - Normal examined duodenum.                        - Normal esophagus.                        - Large hiatal hernia.                        - No specimens collected. Recommendation:        - Perform a colonoscopy today. Procedure Code(s):     --- Professional ---                        [phone], Esophagogastroduodenoscopy, flexible,                         transoral; diagnostic, including collection of                         specimen(s) by brushing or washing, when performed                         (separate procedure) Diagnosis Code(s):     --- Professional ---                        K44.9, Diaphragmatic hernia without obstruction or                         gangrene                        D50.9, Iron deficiency anemia, unspecified CPT copyright 2019 American Medical Association. All rights reserved. The codes documented in this report are preliminary and upon coder review may  be revised to meet current compliance requirements. [name], MD [name] MD, MD 09/18/2019 9:52:47 AM This report has been signed electronically. Number of Addenda: 0 Note Initiated On: 09/18/2019 9:39 AM Estimated Blood Loss:  Estimated blood loss: none.      [facility]

## 2019-09-18 NOTE — Anesthesia Preprocedure Evaluation (Signed)
Anesthesia Evaluation  Patient identified by MRN, date of birth, ID band Patient awake    Reviewed: Allergy & Precautions, NPO status , Patient's Chart, lab work & pertinent test results  History of Anesthesia Complications Negative for: history of anesthetic complications  Airway Mallampati: II  TM Distance: >3 FB Neck ROM: Full    Dental no notable dental hx.    Pulmonary asthma , Current Smoker and Patient abstained from smoking.,    breath sounds clear to auscultation- rhonchi (-) wheezing      Cardiovascular Exercise Tolerance: Good (-) hypertension(-) CAD, (-) Past MI, (-) Cardiac Stents and (-) CABG  Rhythm:Regular Rate:Normal - Systolic murmurs and - Diastolic murmurs    Neuro/Psych  Headaches, neg Seizures PSYCHIATRIC DISORDERS Anxiety Depression Bipolar Disorder    GI/Hepatic Neg liver ROS, hiatal hernia, GERD  ,  Endo/Other  diabetes  Renal/GU negative Renal ROS     Musculoskeletal negative musculoskeletal ROS (+)   Abdominal (+) + obese,   Peds  Hematology  (+) anemia ,   Anesthesia Other Findings Past Medical History: No date: Anxiety No date: Asthma     Comment:  WELL CONTROLLED No date: Bipolar affective disorder, currently manic, mild ([facility]) No date: Chronic anemia No date: Depression 12/2017: Diabetes ([place])     Comment:  patient denies diabetes No date: GERD (gastroesophageal reflux disease) 2018: History of blood transfusion     Comment:  GI BLEED No date: History of hiatal hernia   Reproductive/Obstetrics                             Anesthesia Physical Anesthesia Plan  ASA: III  Anesthesia Plan: General   Post-op Pain Management:    Induction: Intravenous  PONV Risk Score and Plan: 1 and Propofol infusion  Airway Management Planned: Natural Airway  Additional Equipment:   Intra-op Plan:   Post-operative Plan:   Informed Consent: I have reviewed  the patients History and Physical, chart, labs and discussed the procedure including the risks, benefits and alternatives for the proposed anesthesia with the patient or authorized representative who has indicated his/her understanding and acceptance.     Dental advisory given  Plan Discussed with: CRNA and Anesthesiologist  Anesthesia Plan Comments:         Anesthesia Quick Evaluation

## 2019-09-18 NOTE — Anesthesia Postprocedure Evaluation (Signed)
Anesthesia Post Note  Patient: Lori Bentley  Procedure(s) Performed: COLONOSCOPY WITH PROPOFOL (N/A ) ESOPHAGOGASTRODUODENOSCOPY (EGD) WITH PROPOFOL (N/A )  Patient location during evaluation: Endoscopy Anesthesia Type: General Level of consciousness: awake and alert and oriented Pain management: pain level controlled Vital Signs Assessment: post-procedure vital signs reviewed and stable Respiratory status: spontaneous breathing, nonlabored ventilation and respiratory function stable Cardiovascular status: blood pressure returned to baseline and stable Postop Assessment: no signs of nausea or vomiting Anesthetic complications: no     Last Vitals:  Vitals:   09/18/19 0858 09/18/19 1013  BP: 119/84 (!) 89/53  Pulse: 73 93  Resp: 18 15  Temp: (!) 36.1 C 36.8 C  SpO2: 100% 100%    Last Pain:  Vitals:   09/18/19 1043  TempSrc:   PainSc: 0-No pain                 Zyanne Schumm

## 2019-09-18 NOTE — Op Note (Signed)
[facility] Patient Name: Lori Bentley Procedure Date: 09/18/2019 9:41 AM MRN: [id number] Account #: [id number] Date of Birth: 1985/08/30 Admit Type: Outpatient Age: 34 Room: [facility] ENDO ROOM 3 Gender: Female Note Status: Finalized Procedure:             Colonoscopy Indications:           Iron deficiency anemia Providers:             [name] MD, MD Referring MD:          Neomia Dear (Referring MD) Medicines:             Monitored Anesthesia Care Complications:         No immediate complications. Procedure:             Pre-Anesthesia Assessment:                        - Prior to the procedure, a History and Physical was                         performed, and patient medications, allergies and                         sensitivities were reviewed. The patient's tolerance                         of previous anesthesia was reviewed.                        - The risks and benefits of the procedure and the                         sedation options and risks were discussed with the                         patient. All questions were answered and informed                         consent was obtained.                        - ASA Grade Assessment: II - A patient with mild                         systemic disease.                        After obtaining informed consent, the colonoscope was                         passed under direct vision. Throughout the procedure,                         the patient's blood pressure, pulse, and oxygen                         saturations were monitored continuously. The                         Colonoscope was introduced through the anus and  advanced to the the cecum, identified by the                         appendiceal orifice. The colonoscopy was performed                         with ease. The patient tolerated the procedure well.                         The quality of the bowel  preparation was excellent. Findings:      The perianal and digital rectal examinations were normal.      The entire examined colon appeared normal. Impression:            - The entire examined colon is normal.                        - No specimens collected. Recommendation:        - Discharge patient to home (with escort).                        - Resume previous diet.                        - Continue present medications.                        - To visualize the small bowel, perform video capsule                         endoscopy in 4 weeks. Procedure Code(s):     --- Professional ---                        [phone], Colonoscopy, flexible; diagnostic, including                         collection of specimen(s) by brushing or washing, when                         performed (separate procedure) Diagnosis Code(s):     --- Professional ---                        D50.9, Iron deficiency anemia, unspecified CPT copyright 2019 American Medical Association. All rights reserved. The codes documented in this report are preliminary and upon coder review may  be revised to meet current compliance requirements. [name], MD [name] MD, MD 09/18/2019 10:11:57 AM This report has been signed electronically. Number of Addenda: 0 Note Initiated On: 09/18/2019 9:41 AM Scope Withdrawal Time: 0 hours 13 minutes 23 seconds  Total Procedure Duration: 0 hours 16 minutes 5 seconds  Estimated Blood Loss:  Estimated blood loss: none.      [facility]

## 2019-09-21 ENCOUNTER — Encounter: Payer: Self-pay | Admitting: *Deleted

## 2019-09-30 ENCOUNTER — Ambulatory Visit (INDEPENDENT_AMBULATORY_CARE_PROVIDER_SITE_OTHER): Payer: Medicaid Other | Admitting: Gastroenterology

## 2019-09-30 ENCOUNTER — Other Ambulatory Visit: Payer: Self-pay

## 2019-09-30 DIAGNOSIS — D509 Iron deficiency anemia, unspecified: Secondary | ICD-10-CM

## 2019-09-30 NOTE — Progress Notes (Signed)
Lori Bentley , MD [address]  [place]  [place], [place] [address]  Main: [phone]  Fax: [phone]   Primary Care Physician: Lori Finner, NP  Virtual Visit via Telephone Note  I connected with patient on 09/30/19 at  9:45 AM EST by telephone and verified that I am speaking with the correct person using two identifiers.   I discussed the limitations, risks, security and privacy concerns of performing an evaluation and management service by telephone and the availability of in person appointments. I also discussed with the patient that there may be a patient responsible charge related to this service. The patient expressed understanding and agreed to proceed.  Location of Patient: Home Location of Provider: Home Persons involved: Patient and provider only   History of Present Illness: Chief Complaint  Patient presents with  . Follow-up    Iron deficiency anemia    HPI: Lori Bentley is a 34 y.o. female   Summary of history :  Initially referred and seen in January 2021 for anemia.She was admitted to the hospital in 03/19/2019 with near syncope secondary to anemia hemoglobin was noted to be 5.8 g.  Noted to have severe iron deficiency and given IV iron.  She also received a blood transfusion.  Patient did not have an EGD as recommended by GI and want to be done as an outpatient.  Ferritin was 6 at that time.  History of menorrhagia for which she underwent a myomectomy of uterine fibroids in June 2019.  Looking back into her records in 2017 she also had microcytic anemia and was seen by another gastroenterologist unclear what happened after that.  Denied any overt loss.   Interval history   08/11/2019-09/30/2019  08/19/2019: Hemoglobin 7.8 g with an MCV of 78.4.  Iron of 11 and ferritin of 4. 09/18/2019: EGD: Large hiatal hernia was noted otherwise normal endoscopic evaluation colonoscopy performed on the same day demonstrated no abnormalities.  Follows  with hematology Dr. Janese Banks getting IV iron. Doing well no complaints normal bowel movements.  Current Outpatient Medications  Medication Sig Dispense Refill  . albuterol (PROVENTIL HFA;VENTOLIN HFA) 108 (90 Base) MCG/ACT inhaler Inhale 2 puffs into the lungs every 6 (six) hours as needed for wheezing or shortness of breath. 1 Inhaler 0  . escitalopram (LEXAPRO) 20 MG tablet Take 20 mg by mouth every morning.     . eszopiclone (LUNESTA) 2 MG TABS tablet Take 2 mg by mouth at bedtime as needed for sleep. Take immediately before bedtime    . ferrous sulfate 325 (65 FE) MG tablet Take 1 tablet (325 mg total) by mouth 2 (two) times daily with a meal. 60 tablet 3  . lurasidone (LATUDA) 20 MG TABS tablet Take 20 mg by mouth daily.    . pantoprazole (PROTONIX) 40 MG tablet TAKE 1 TABLET BY MOUTH DAILY 30 tablet 2   No current facility-administered medications for this visit.    Allergies as of 09/30/2019 - Review Complete 09/30/2019  Allergen Reaction Noted  . Nsaids  12/26/2017  . Peanut-containing drug products Itching 01/02/2018  . Percocet [oxycodone-acetaminophen] Swelling 01/15/2015  . Phenylephrine-guaifenesin Other (See Comments) 06/19/2012    Review of Systems:    All systems reviewed and negative except where noted in HPI.   Observations/Objective:  Labs: CMP     Component Value Date/Time   NA 137 03/28/2019 0842   K 3.5 03/28/2019 0842   CL 108 03/28/2019 0842   CO2 23 03/28/2019 [id number]  GLUCOSE 99 03/28/2019 0842   BUN 10 03/28/2019 0842   CREATININE 0.67 03/28/2019 0842   CALCIUM 8.0 (L) 03/28/2019 0842   PROT 7.2 11/22/2016 1841   ALBUMIN 4.1 11/22/2016 1841   AST 45 (H) 11/22/2016 1841   ALT 46 11/22/2016 1841   ALKPHOS 59 11/22/2016 1841   BILITOT 0.6 11/22/2016 1841   GFRNONAA >60 03/28/2019 0842   GFRAA >60 03/28/2019 0842   Lab Results  Component Value Date   WBC 6.1 08/19/2019   HGB 7.8 (L) 08/19/2019   HCT 28.7 (L) 08/19/2019   MCV 78.4 (L) 08/19/2019    PLT 474 (H) 08/19/2019    Imaging Studies: No results found.  Assessment and Plan:   Lori Bentley is a 34 y.o. y/o female here to follow-up for iron deficiency anemia.  History of menorrhagia in 01/16/2018 for which she underwent myomectomy of uterine fibroids.    Getting IV iron with hematology.  EGD and colonoscopy did not she have any cross lesions that would contribute to the anemia..  Plan 1.    Proceed with capsule study of the small bowel.   Risks, benefits, alternatives of Givens capsule discussed with patient to include but not limited to the rare risk of Given's capsule becoming lodged in the GI tract requiring surgical removal.  The patient agrees with this plan & consent will be obtained.  I discussed the assessment and treatment plan with the patient. The patient was provided an opportunity to ask questions and all were answered. The patient agreed with the plan and demonstrated an understanding of the instructions.   The patient was advised to call back or seek an in-person evaluation if the symptoms worsen or if the condition fails to improve as anticipated.  I provided 12 minutes of non-face-to-face time during this encounter.  Dr Lori Bellows MD,MRCP [facility]) Gastroenterology/Hepatology Pager: [phone]   Speech recognition software was used to dictate this note.

## 2019-10-29 ENCOUNTER — Inpatient Hospital Stay: Payer: Medicaid Other | Attending: Oncology

## 2019-10-29 DIAGNOSIS — N920 Excessive and frequent menstruation with regular cycle: Secondary | ICD-10-CM | POA: Insufficient documentation

## 2019-10-29 DIAGNOSIS — D509 Iron deficiency anemia, unspecified: Secondary | ICD-10-CM | POA: Insufficient documentation

## 2019-10-30 ENCOUNTER — Other Ambulatory Visit: Payer: Self-pay

## 2019-10-30 ENCOUNTER — Inpatient Hospital Stay (HOSPITAL_BASED_OUTPATIENT_CLINIC_OR_DEPARTMENT_OTHER): Payer: Medicaid Other | Admitting: Oncology

## 2019-10-30 ENCOUNTER — Encounter: Payer: Self-pay | Admitting: Oncology

## 2019-10-30 DIAGNOSIS — D500 Iron deficiency anemia secondary to blood loss (chronic): Secondary | ICD-10-CM | POA: Diagnosis not present

## 2019-10-30 NOTE — Progress Notes (Signed)
Patient stated that she had been doing well with no complaints. 

## 2019-11-03 NOTE — Progress Notes (Signed)
I connected with Lori Bentley on 11/03/19 at 10:00 AM EDT by video enabled telemedicine visit and verified that I am speaking with the correct person using two identifiers.   I discussed the limitations, risks, security and privacy concerns of performing an evaluation and management service by telemedicine and the availability of in-person appointments. I also discussed with the patient that there may be a patient responsible charge related to this service. The patient expressed understanding and agreed to proceed.  Other persons participating in the visit and their role in the encounter:  none  Patient's location:  home Provider's location:  work  Risk analyst Complaint: Iron deficiency anemia follow-up  History of present illness: Patient is a 34 year old female with iron deficiency anemia that has been attributed to menorrhagia in the past.  She has received IV iron in the past But was then lost to follow-up in 2018.  She was recently admitted to the hospital for significant anemia with a hemoglobin of 7.8.  She was also seen by GI and is scheduled to undergo endoscopy and colonoscopy.  EGD showed large hiatal hernia.  Colonoscopy was normal.  She has had surgery for her fibroids in 2019 and reports that since then her menstrual cycles are not as heavy as before but she still has first 1 or 2 days when her menstrual cycles would be heavy   Interval history: Patient reports that her menstrual cycles have been more regular recently. Denies any blood in stool or urine   Review of Systems  Constitutional: Negative for chills, fever, malaise/fatigue and weight loss.  HENT: Negative for congestion, ear discharge and nosebleeds.   Eyes: Negative for blurred vision.  Respiratory: Negative for cough, hemoptysis, sputum production, shortness of breath and wheezing.   Cardiovascular: Negative for chest pain, palpitations, orthopnea and claudication.  Gastrointestinal: Negative for abdominal pain, blood  in stool, constipation, diarrhea, heartburn, melena, nausea and vomiting.  Genitourinary: Negative for dysuria, flank pain, frequency, hematuria and urgency.  Musculoskeletal: Negative for back pain, joint pain and myalgias.  Skin: Negative for rash.  Neurological: Negative for dizziness, tingling, focal weakness, seizures, weakness and headaches.  Endo/Heme/Allergies: Does not bruise/bleed easily.  Psychiatric/Behavioral: Negative for depression and suicidal ideas. The patient does not have insomnia.     Allergies  Allergen Reactions  . Nsaids     GI BLEED-HAD BLOOD TRASNFUSION  . Peanut-Containing Drug Products Itching    Itching in throat  . Percocet [Oxycodone-Acetaminophen] Swelling    Swelling of face and throat  . Phenylephrine-Guaifenesin Other (See Comments)    Unknown reaction. Does not remember reaction    Past Medical History:  Diagnosis Date  . Anxiety   . Asthma    WELL CONTROLLED  . Bipolar affective disorder, currently manic, mild ([place])   . Chronic anemia   . Depression   . Diabetes ([place]) 12/2017   patient denies diabetes  . GERD (gastroesophageal reflux disease)   . History of blood transfusion 2018   GI BLEED  . History of hiatal hernia     Past Surgical History:  Procedure Laterality Date  . COLONOSCOPY WITH PROPOFOL N/A 09/18/2019   Procedure: COLONOSCOPY WITH PROPOFOL;  Surgeon: [name], MD;  Location: [facility] ENDOSCOPY;  Service: Gastroenterology;  Laterality: N/A;  . ESOPHAGOGASTRODUODENOSCOPY (EGD) WITH PROPOFOL N/A 05/18/2016   Procedure: ESOPHAGOGASTRODUODENOSCOPY (EGD) WITH PROPOFOL;  Surgeon: Manya Silvas, MD;  Location[facility] ENDOSCOPY;  Service: Endoscopy;  Laterality: N/A;  . ESOPHAGOGASTRODUODENOSCOPY (EGD) WITH PROPOFOL N/A 09/18/2019   Procedure: ESOPHAGOGASTRODUODENOSCOPY (  EGD) WITH PROPOFOL;  Surgeon: [name], MD;  Location: [facility] ENDOSCOPY;  Service: Gastroenterology;  Laterality: N/A;  . MYOMECTOMY N/A 01/02/2018   Procedure:  MYOMECTOMY;  Surgeon: Gae Dry, MD;  Location: [facility] ORS;  Service: Gynecology;  Laterality: N/A;  . TUBAL LIGATION  2012    Social History   Socioeconomic History  . Marital status: Single    Spouse name: Not on file  . Number of children: Not on file  . Years of education: Not on file  . Highest education level: Not on file  Occupational History  . Not on file  Tobacco Use  . Smoking status: Current Every Day Smoker    Packs/day: 0.25    Years: 1.00    Pack years: 0.25    Types: Cigarettes  . Smokeless tobacco: Never Used  Substance and Sexual Activity  . Alcohol use: Yes    Comment: socially  . Drug use: Yes    Types: Marijuana    Comment: occasionally   . Sexual activity: Yes    Birth control/protection: Injection  Other Topics Concern  . Not on file  Social History Narrative  . Not on file   Social Determinants of Health   Financial Resource Strain:   . Difficulty of Paying Living Expenses:   Food Insecurity:   . Worried About Charity fundraiser in the Last Year:   . Arboriculturist in the Last Year:   Transportation Needs:   . Film/video editor (Medical):   Marland Kitchen Lack of Transportation (Non-Medical):   Physical Activity:   . Days of Exercise per Week:   . Minutes of Exercise per Session:   Stress:   . Feeling of Stress :   Social Connections:   . Frequency of Communication with Friends and Family:   . Frequency of Social Gatherings with Friends and Family:   . Attends Religious Services:   . Active Member of Clubs or Organizations:   . Attends Archivist Meetings:   Marland Kitchen Marital Status:   Intimate Partner Violence:   . Fear of Current or Ex-Partner:   . Emotionally Abused:   Marland Kitchen Physically Abused:   . Sexually Abused:     Family History  Problem Relation Age of Onset  . Anxiety disorder Mother   . Asthma Mother   . Fibroids Mother   . GER disease Father      Current Outpatient Medications:  .  albuterol (PROVENTIL  HFA;VENTOLIN HFA) 108 (90 Base) MCG/ACT inhaler, Inhale 2 puffs into the lungs every 6 (six) hours as needed for wheezing or shortness of breath., Disp: 1 Inhaler, Rfl: 0 .  escitalopram (LEXAPRO) 20 MG tablet, Take 20 mg by mouth every morning. , Disp: , Rfl:  .  eszopiclone (LUNESTA) 2 MG TABS tablet, Take 2 mg by mouth at bedtime as needed for sleep. Take immediately before bedtime, Disp: , Rfl:  .  ferrous sulfate 325 (65 FE) MG tablet, Take 1 tablet (325 mg total) by mouth 2 (two) times daily with a meal., Disp: 60 tablet, Rfl: 3 .  lurasidone (LATUDA) 20 MG TABS tablet, Take 20 mg by mouth daily., Disp: , Rfl:  .  pantoprazole (PROTONIX) 40 MG tablet, TAKE 1 TABLET BY MOUTH DAILY, Disp: 30 tablet, Rfl: 2  No results found.  No images are attached to the encounter.   CMP Latest Ref Rng & Units 03/28/2019  Glucose 70 - 99 mg/dL 99  BUN 6 - 20 mg/dL  10  Creatinine 0.44 - 1.00 mg/dL 0.67  Sodium 135 - 145 mmol/L 137  Potassium 3.5 - 5.1 mmol/L 3.5  Chloride 98 - 111 mmol/L 108  CO2 22 - 32 mmol/L 23  Calcium 8.9 - 10.3 mg/dL 8.0(L)  Total Protein 6.5 - 8.1 g/dL -  Total Bilirubin 0.3 - 1.2 mg/dL -  Alkaline Phos 38 - 126 U/L -  AST 15 - 41 U/L -  ALT 14 - 54 U/L -   CBC Latest Ref Rng & Units 08/19/2019  WBC 4.0 - 10.5 K/uL 6.1  Hemoglobin 12.0 - 15.0 g/dL 7.8(L)  Hematocrit 36.0 - 46.0 % 28.7(L)  Platelets 150 - 400 K/uL 474(H)     Observation/objective: appears in no acute distress. Breathing is non labored  Assessment and plan: Patient is a 34 year old female with iron deficiency anemia possibly secondary to menorrhagia hiatal hernia  Patient was supposed to get a repeat blood work this month but she could not come due to lack of transportation.  She is willing to come and get her blood work done in 1 week's time.  Based on her blood work we will decide if she needs further IV iron or not.  Follow-up instructions: CBC ferritin iron studies in 2 months in 4 months and I  will see her back in 4 months  I discussed the assessment and treatment plan with the patient. The patient was provided an opportunity to ask questions and all were answered. The patient agreed with the plan and demonstrated an understanding of the instructions.   The patient was advised to call back or seek an in-person evaluation if the symptoms worsen or if the condition fails to improve as anticipated.    Visit Diagnosis: 1. Iron deficiency anemia due to chronic blood loss     Dr. Randa Evens, MD, MPH [facility] at [facility] Tel- [phone] 11/03/2019 12:46 PM

## 2019-11-06 ENCOUNTER — Inpatient Hospital Stay: Payer: Medicaid Other

## 2019-11-06 ENCOUNTER — Other Ambulatory Visit: Payer: Self-pay

## 2019-11-06 ENCOUNTER — Other Ambulatory Visit: Payer: Self-pay | Admitting: Oncology

## 2019-11-06 DIAGNOSIS — D509 Iron deficiency anemia, unspecified: Secondary | ICD-10-CM | POA: Diagnosis not present

## 2019-11-06 DIAGNOSIS — N920 Excessive and frequent menstruation with regular cycle: Secondary | ICD-10-CM | POA: Diagnosis not present

## 2019-11-06 DIAGNOSIS — D500 Iron deficiency anemia secondary to blood loss (chronic): Secondary | ICD-10-CM

## 2019-11-06 LAB — CBC WITH DIFFERENTIAL/PLATELET
Abs Immature Granulocytes: 0.02 10*3/uL (ref 0.00–0.07)
Basophils Absolute: 0.1 10*3/uL (ref 0.0–0.1)
Basophils Relative: 1 %
Eosinophils Absolute: 0.7 10*3/uL — ABNORMAL HIGH (ref 0.0–0.5)
Eosinophils Relative: 9 %
HCT: 34.4 % — ABNORMAL LOW (ref 36.0–46.0)
Hemoglobin: 10.1 g/dL — ABNORMAL LOW (ref 12.0–15.0)
Immature Granulocytes: 0 %
Lymphocytes Relative: 24 %
Lymphs Abs: 1.9 10*3/uL (ref 0.7–4.0)
MCH: 23.8 pg — ABNORMAL LOW (ref 26.0–34.0)
MCHC: 29.4 g/dL — ABNORMAL LOW (ref 30.0–36.0)
MCV: 80.9 fL (ref 80.0–100.0)
Monocytes Absolute: 0.4 10*3/uL (ref 0.1–1.0)
Monocytes Relative: 5 %
Neutro Abs: 4.8 10*3/uL (ref 1.7–7.7)
Neutrophils Relative %: 61 %
Platelets: 644 10*3/uL — ABNORMAL HIGH (ref 150–400)
RBC: 4.25 MIL/uL (ref 3.87–5.11)
RDW: 23.1 % — ABNORMAL HIGH (ref 11.5–15.5)
WBC: 7.8 10*3/uL (ref 4.0–10.5)
nRBC: 0 % (ref 0.0–0.2)

## 2019-11-06 LAB — IRON AND TIBC
Iron: 15 ug/dL — ABNORMAL LOW (ref 28–170)
Saturation Ratios: 4 % — ABNORMAL LOW (ref 10.4–31.8)
TIBC: 409 ug/dL (ref 250–450)
UIBC: 394 ug/dL

## 2019-11-06 LAB — FERRITIN: Ferritin: 8 ng/mL — ABNORMAL LOW (ref 11–307)

## 2019-11-09 ENCOUNTER — Encounter: Payer: Self-pay | Admitting: *Deleted

## 2019-11-09 ENCOUNTER — Ambulatory Visit
Admission: RE | Admit: 2019-11-09 | Discharge: 2019-11-09 | Disposition: A | Discharge status: Home / Self Care | Payer: Medicaid Other | Attending: Gastroenterology | Admitting: Gastroenterology

## 2019-11-09 ENCOUNTER — Other Ambulatory Visit: Payer: Self-pay

## 2019-11-09 ENCOUNTER — Encounter
Admission: RE | Disposition: A | Discharge status: Home / Self Care | Payer: Self-pay | Source: Home / Self Care | Attending: Gastroenterology

## 2019-11-09 DIAGNOSIS — K449 Diaphragmatic hernia without obstruction or gangrene: Secondary | ICD-10-CM | POA: Diagnosis not present

## 2019-11-09 DIAGNOSIS — D509 Iron deficiency anemia, unspecified: Secondary | ICD-10-CM | POA: Insufficient documentation

## 2019-11-09 HISTORY — PX: GIVENS CAPSULE STUDY: SHX5432

## 2019-11-09 SURGERY — IMAGING PROCEDURE, GI TRACT, INTRALUMINAL, VIA CAPSULE

## 2019-11-13 ENCOUNTER — Other Ambulatory Visit: Payer: Self-pay

## 2019-11-13 ENCOUNTER — Inpatient Hospital Stay: Payer: Medicaid Other

## 2019-11-13 VITALS — BP 114/75 | HR 73 | Temp 98.4°F | Resp 18

## 2019-11-13 DIAGNOSIS — D509 Iron deficiency anemia, unspecified: Secondary | ICD-10-CM | POA: Diagnosis not present

## 2019-11-13 DIAGNOSIS — D500 Iron deficiency anemia secondary to blood loss (chronic): Secondary | ICD-10-CM

## 2019-11-13 MED ORDER — SODIUM CHLORIDE 0.9 % IV SOLN
510.0000 mg | Freq: Once | INTRAVENOUS | Status: AC
  Administered 2019-11-13: 510 mg via INTRAVENOUS
  Filled 2019-11-13: qty 17

## 2019-11-13 MED ORDER — SODIUM CHLORIDE 0.9 % IV SOLN
Freq: Once | INTRAVENOUS | Status: AC
  Filled 2019-11-13: qty 250

## 2019-11-13 NOTE — Progress Notes (Signed)
Pt declines to stay for 30 minute observation, per Pt previous feraheme infusions have been tolerated well.  Pt tolerated infusion well. No s/s of distress or reaction noted. Pt and VS stable at discharge.

## 2019-11-18 ENCOUNTER — Telehealth: Payer: Self-pay | Admitting: Oncology

## 2019-11-18 NOTE — Telephone Encounter (Signed)
MD will not be in the office on 03-03-20. Writer phoned patient on this date and rescheduled appts. °

## 2019-11-19 ENCOUNTER — Encounter (INDEPENDENT_AMBULATORY_CARE_PROVIDER_SITE_OTHER): Payer: Self-pay | Admitting: Gastroenterology

## 2019-11-19 ENCOUNTER — Inpatient Hospital Stay: Payer: Medicaid Other

## 2019-11-19 ENCOUNTER — Other Ambulatory Visit: Payer: Self-pay

## 2019-11-19 VITALS — BP 122/87 | HR 84 | Temp 98.3°F | Resp 18

## 2019-11-19 DIAGNOSIS — Z5329 Procedure and treatment not carried out because of patient's decision for other reasons: Secondary | ICD-10-CM

## 2019-11-19 DIAGNOSIS — D509 Iron deficiency anemia, unspecified: Secondary | ICD-10-CM | POA: Diagnosis not present

## 2019-11-19 DIAGNOSIS — D500 Iron deficiency anemia secondary to blood loss (chronic): Secondary | ICD-10-CM

## 2019-11-19 MED ORDER — SODIUM CHLORIDE 0.9 % IV SOLN
Freq: Once | INTRAVENOUS | Status: AC
  Filled 2019-11-19: qty 250

## 2019-11-19 MED ORDER — SODIUM CHLORIDE 0.9 % IV SOLN
510.0000 mg | Freq: Once | INTRAVENOUS | Status: AC
  Administered 2019-11-19: 13:00:00 510 mg via INTRAVENOUS
  Filled 2019-11-19: qty 510

## 2019-11-19 NOTE — Progress Notes (Signed)
Rescheduled

## 2019-12-17 ENCOUNTER — Other Ambulatory Visit: Payer: Self-pay | Admitting: Gastroenterology

## 2019-12-30 ENCOUNTER — Inpatient Hospital Stay: Payer: Medicaid Other

## 2019-12-31 ENCOUNTER — Other Ambulatory Visit: Payer: Self-pay

## 2019-12-31 ENCOUNTER — Inpatient Hospital Stay: Payer: Medicaid Other | Attending: Oncology

## 2019-12-31 DIAGNOSIS — D509 Iron deficiency anemia, unspecified: Secondary | ICD-10-CM | POA: Diagnosis present

## 2019-12-31 DIAGNOSIS — D500 Iron deficiency anemia secondary to blood loss (chronic): Secondary | ICD-10-CM

## 2019-12-31 LAB — CBC WITH DIFFERENTIAL/PLATELET
Abs Immature Granulocytes: 0.01 10*3/uL (ref 0.00–0.07)
Basophils Absolute: 0 10*3/uL (ref 0.0–0.1)
Basophils Relative: 1 %
Eosinophils Absolute: 0.6 10*3/uL — ABNORMAL HIGH (ref 0.0–0.5)
Eosinophils Relative: 9 %
HCT: 37.6 % (ref 36.0–46.0)
Hemoglobin: 12.2 g/dL (ref 12.0–15.0)
Immature Granulocytes: 0 %
Lymphocytes Relative: 28 %
Lymphs Abs: 2 10*3/uL (ref 0.7–4.0)
MCH: 27.1 pg (ref 26.0–34.0)
MCHC: 32.4 g/dL (ref 30.0–36.0)
MCV: 83.6 fL (ref 80.0–100.0)
Monocytes Absolute: 0.3 10*3/uL (ref 0.1–1.0)
Monocytes Relative: 5 %
Neutro Abs: 4.1 10*3/uL (ref 1.7–7.7)
Neutrophils Relative %: 57 %
Platelets: 420 10*3/uL — ABNORMAL HIGH (ref 150–400)
RBC: 4.5 MIL/uL (ref 3.87–5.11)
RDW: 18.8 % — ABNORMAL HIGH (ref 11.5–15.5)
WBC: 7.1 10*3/uL (ref 4.0–10.5)
nRBC: 0 % (ref 0.0–0.2)

## 2019-12-31 LAB — IRON AND TIBC
Iron: 40 ug/dL (ref 28–170)
Saturation Ratios: 12 % (ref 10.4–31.8)
TIBC: 347 ug/dL (ref 250–450)
UIBC: 307 ug/dL

## 2019-12-31 LAB — FERRITIN: Ferritin: 20 ng/mL (ref 11–307)

## 2020-02-24 ENCOUNTER — Other Ambulatory Visit: Payer: Medicaid Other

## 2020-02-24 ENCOUNTER — Telehealth: Payer: Medicaid Other | Admitting: Oncology

## 2020-02-29 ENCOUNTER — Other Ambulatory Visit: Payer: Medicaid Other

## 2020-03-03 ENCOUNTER — Telehealth: Payer: Medicaid Other | Admitting: Oncology

## 2020-03-10 ENCOUNTER — Other Ambulatory Visit: Payer: Self-pay

## 2020-03-10 ENCOUNTER — Inpatient Hospital Stay: Payer: Medicaid Other | Attending: Oncology

## 2020-03-10 DIAGNOSIS — D509 Iron deficiency anemia, unspecified: Secondary | ICD-10-CM | POA: Diagnosis present

## 2020-03-10 DIAGNOSIS — D500 Iron deficiency anemia secondary to blood loss (chronic): Secondary | ICD-10-CM

## 2020-03-10 LAB — IRON AND TIBC
Iron: 17 ug/dL — ABNORMAL LOW (ref 28–170)
Saturation Ratios: 5 % — ABNORMAL LOW (ref 10.4–31.8)
TIBC: 372 ug/dL (ref 250–450)
UIBC: 355 ug/dL

## 2020-03-10 LAB — CBC WITH DIFFERENTIAL/PLATELET
Abs Immature Granulocytes: 0.03 10*3/uL (ref 0.00–0.07)
Basophils Absolute: 0.1 10*3/uL (ref 0.0–0.1)
Basophils Relative: 1 %
Eosinophils Absolute: 0.6 10*3/uL — ABNORMAL HIGH (ref 0.0–0.5)
Eosinophils Relative: 8 %
HCT: 32.4 % — ABNORMAL LOW (ref 36.0–46.0)
Hemoglobin: 10.4 g/dL — ABNORMAL LOW (ref 12.0–15.0)
Immature Granulocytes: 0 %
Lymphocytes Relative: 28 %
Lymphs Abs: 2.2 10*3/uL (ref 0.7–4.0)
MCH: 26.1 pg (ref 26.0–34.0)
MCHC: 32.1 g/dL (ref 30.0–36.0)
MCV: 81.4 fL (ref 80.0–100.0)
Monocytes Absolute: 0.3 10*3/uL (ref 0.1–1.0)
Monocytes Relative: 4 %
Neutro Abs: 4.6 10*3/uL (ref 1.7–7.7)
Neutrophils Relative %: 59 %
Platelets: 501 10*3/uL — ABNORMAL HIGH (ref 150–400)
RBC: 3.98 MIL/uL (ref 3.87–5.11)
RDW: 14.6 % (ref 11.5–15.5)
WBC: 7.7 10*3/uL (ref 4.0–10.5)
nRBC: 0 % (ref 0.0–0.2)

## 2020-03-10 LAB — FERRITIN: Ferritin: 9 ng/mL — ABNORMAL LOW (ref 11–307)

## 2020-03-11 ENCOUNTER — Inpatient Hospital Stay (HOSPITAL_BASED_OUTPATIENT_CLINIC_OR_DEPARTMENT_OTHER): Payer: Medicaid Other | Admitting: Oncology

## 2020-03-11 ENCOUNTER — Telehealth: Payer: Self-pay

## 2020-03-11 DIAGNOSIS — D509 Iron deficiency anemia, unspecified: Secondary | ICD-10-CM | POA: Diagnosis not present

## 2020-03-11 NOTE — Telephone Encounter (Signed)
Pt calling; still having heavy periods.  [phone]  Pt states is wearing an ultra tampon and panty liner and changes about every hour.  Adv our policy is if saturating a pad q66min-1hr to go to ED.  States 'Oh no not like that'.  Tx'd to [place] for scheduling.

## 2020-03-13 ENCOUNTER — Encounter: Payer: Self-pay | Admitting: Oncology

## 2020-03-13 NOTE — Progress Notes (Signed)
I connected with Stasha Buechler on 03/13/20 at  2:45 PM EDT by video enabled telemedicine visit and verified that I am speaking with the correct person using two identifiers.   I discussed the limitations, risks, security and privacy concerns of performing an evaluation and management service by telemedicine and the availability of in-person appointments. I also discussed with the patient that there may be a patient responsible charge related to this service. The patient expressed understanding and agreed to proceed.  Other persons participating in the visit and their role in the encounter:  none  Patient's location:  home Provider's location:  work  Risk analyst Complaint: Routine follow-up of iron deficiency  History of present illness:  Patient is a 34 year old female with iron deficiency anemia that has been attributed to menorrhagia in the past. She has received IV iron in the past But was then lost to follow-up in 2018. She was recently admitted to the hospital for significant anemia with a hemoglobin of 7.8. She was also seen by GI and is scheduled to undergo endoscopy and colonoscopy.  EGD showed large hiatal hernia.  Colonoscopy was normal. She has had surgery for her fibroids in 2019 and reports that since then her menstrual cycles are not as heavy as before but she still has first 1 or 2 days when her menstrual cycles would be heavy  Interval history: Reports heavy menstrual cycles despite undergoing myomectomy for fibroids back in June 2019.  Reports ongoing fatigue.  Denies other complaints   Review of Systems  Constitutional: Positive for malaise/fatigue. Negative for chills, fever and weight loss.  HENT: Negative for congestion, ear discharge and nosebleeds.   Eyes: Negative for blurred vision.  Respiratory: Negative for cough, hemoptysis, sputum production, shortness of breath and wheezing.   Cardiovascular: Negative for chest pain, palpitations, orthopnea and claudication.   Gastrointestinal: Negative for abdominal pain, blood in stool, constipation, diarrhea, heartburn, melena, nausea and vomiting.  Genitourinary: Negative for dysuria, flank pain, frequency, hematuria and urgency.  Musculoskeletal: Negative for back pain, joint pain and myalgias.  Skin: Negative for rash.  Neurological: Negative for dizziness, tingling, focal weakness, seizures, weakness and headaches.  Endo/Heme/Allergies: Does not bruise/bleed easily.       Heavy menses  Psychiatric/Behavioral: Negative for depression and suicidal ideas. The patient does not have insomnia.     Allergies  Allergen Reactions  . Nsaids     GI BLEED-HAD BLOOD TRASNFUSION  . Peanut-Containing Drug Products Itching    Itching in throat  . Percocet [Oxycodone-Acetaminophen] Swelling    Swelling of face and throat  . Phenylephrine-Guaifenesin Other (See Comments)    Unknown reaction. Does not remember reaction    Past Medical History:  Diagnosis Date  . Anxiety   . Asthma    WELL CONTROLLED  . Bipolar affective disorder, currently manic, mild ([place])   . Chronic anemia   . Depression   . Diabetes ([place]) 12/2017   patient denies diabetes  . GERD (gastroesophageal reflux disease)   . History of blood transfusion 2018   GI BLEED  . History of hiatal hernia     Past Surgical History:  Procedure Laterality Date  . COLONOSCOPY WITH PROPOFOL N/A 09/18/2019   Procedure: COLONOSCOPY WITH PROPOFOL;  Surgeon: [name], MD;  Location: [facility] ENDOSCOPY;  Service: Gastroenterology;  Laterality: N/A;  . ESOPHAGOGASTRODUODENOSCOPY (EGD) WITH PROPOFOL N/A 05/18/2016   Procedure: ESOPHAGOGASTRODUODENOSCOPY (EGD) WITH PROPOFOL;  Surgeon: Manya Silvas, MD;  Location: [facility] ENDOSCOPY;  Service: Endoscopy;  Laterality: N/A;  .  ESOPHAGOGASTRODUODENOSCOPY (EGD) WITH PROPOFOL N/A 09/18/2019   Procedure: ESOPHAGOGASTRODUODENOSCOPY (EGD) WITH PROPOFOL;  Surgeon: [name], MD;  Location: [facility] ENDOSCOPY;  Service:  Gastroenterology;  Laterality: N/A;  . GIVENS CAPSULE STUDY N/A 11/09/2019   Procedure: GIVENS CAPSULE STUDY;  Surgeon: [name], MD;  Location: [facility] ENDOSCOPY;  Service: Gastroenterology;  Laterality: N/A;  . MYOMECTOMY N/A 01/02/2018   Procedure: MYOMECTOMY;  Surgeon: Gae Dry, MD;  Location: [facility] ORS;  Service: Gynecology;  Laterality: N/A;  . TUBAL LIGATION  2012    Social History   Socioeconomic History  . Marital status: Single    Spouse name: Not on file  . Number of children: Not on file  . Years of education: Not on file  . Highest education level: Not on file  Occupational History  . Not on file  Tobacco Use  . Smoking status: Current Every Day Smoker    Packs/day: 0.25    Years: 1.00    Pack years: 0.25    Types: Cigarettes  . Smokeless tobacco: Never Used  Vaping Use  . Vaping Use: Never used  Substance and Sexual Activity  . Alcohol use: Yes    Comment: socially  . Drug use: Yes    Types: Marijuana    Comment: occasionally   . Sexual activity: Yes    Birth control/protection: Injection  Other Topics Concern  . Not on file  Social History Narrative  . Not on file   Social Determinants of Health   Financial Resource Strain:   . Difficulty of Paying Living Expenses:   Food Insecurity:   . Worried About Charity fundraiser in the Last Year:   . Arboriculturist in the Last Year:   Transportation Needs:   . Film/video editor (Medical):   Marland Kitchen Lack of Transportation (Non-Medical):   Physical Activity:   . Days of Exercise per Week:   . Minutes of Exercise per Session:   Stress:   . Feeling of Stress :   Social Connections:   . Frequency of Communication with Friends and Family:   . Frequency of Social Gatherings with Friends and Family:   . Attends Religious Services:   . Active Member of Clubs or Organizations:   . Attends Archivist Meetings:   Marland Kitchen Marital Status:   Intimate Partner Violence:   . Fear of Current or Ex-Partner:    . Emotionally Abused:   Marland Kitchen Physically Abused:   . Sexually Abused:     Family History  Problem Relation Age of Onset  . Anxiety disorder Mother   . Asthma Mother   . Fibroids Mother   . GER disease Father      Current Outpatient Medications:  .  albuterol (PROVENTIL HFA;VENTOLIN HFA) 108 (90 Base) MCG/ACT inhaler, Inhale 2 puffs into the lungs every 6 (six) hours as needed for wheezing or shortness of breath., Disp: 1 Inhaler, Rfl: 0 .  escitalopram (LEXAPRO) 20 MG tablet, Take 20 mg by mouth every morning. , Disp: , Rfl:  .  ferrous sulfate 325 (65 FE) MG tablet, Take 1 tablet (325 mg total) by mouth 2 (two) times daily with a meal., Disp: 60 tablet, Rfl: 3 .  pantoprazole (PROTONIX) 40 MG tablet, TAKE 1 TABLET BY MOUTH DAILY, Disp: 30 tablet, Rfl: 2 .  ABILIFY MAINTENA 400 MG PRSY prefilled syringe, Inject into the muscle., Disp: , Rfl:  .  eszopiclone (LUNESTA) 2 MG TABS tablet, Take 2 mg by mouth at bedtime  as needed for sleep. Take immediately before bedtime (Patient not taking: Reported on 03/11/2020), Disp: , Rfl:   No results found.  No images are attached to the encounter.   CMP Latest Ref Rng & Units 03/28/2019  Glucose 70 - 99 mg/dL 99  BUN 6 - 20 mg/dL 10  Creatinine 0.44 - 1.00 mg/dL 0.67  Sodium 135 - 145 mmol/L 137  Potassium 3.5 - 5.1 mmol/L 3.5  Chloride 98 - 111 mmol/L 108  CO2 22 - 32 mmol/L 23  Calcium 8.9 - 10.3 mg/dL 8.0(L)  Total Protein 6.5 - 8.1 g/dL -  Total Bilirubin 0.3 - 1.2 mg/dL -  Alkaline Phos 38 - 126 U/L -  AST 15 - 41 U/L -  ALT 14 - 54 U/L -   CBC Latest Ref Rng & Units 03/10/2020  WBC 4.0 - 10.5 K/uL 7.7  Hemoglobin 12.0 - 15.0 g/dL 10.4(L)  Hematocrit 36 - 46 % 32.4(L)  Platelets 150 - 400 K/uL 501(H)     Observation/objective: Appears in no acute distress on video visit today.  Breathing is nonlabored  Assessment and plan: Patient is a 34 year old female with history of iron deficiency anemia here for routine  follow-up.  Patient last received IV iron in April 2021.  Following that her hemoglobin improved to 12.2 in June 2021 and is back down to 10.4 today.  Iron studies again show evidence of iron deficiency.  This is likely due to her ongoing heavy menstrual cycles I therefore feel she would benefit from her Feraheme at this time but I have encouraged her to speak with Dr. Kenton Kingfisher from GYN to see if any other contraceptives can be pursued to control her heavy menstrual cycles.  Otherwise she is going to need ongoing IV iron on a frequent basis.  Follow-up instructions: CBC ferritin and iron studies in 3 in 6 months and I will see her back in 6 months  I discussed the assessment and treatment plan with the patient. The patient was provided an opportunity to ask questions and all were answered. The patient agreed with the plan and demonstrated an understanding of the instructions.   The patient was advised to call back or seek an in-person evaluation if the symptoms worsen or if the condition fails to improve as anticipated.   Visit Diagnosis: 1. Iron deficiency anemia, unspecified iron deficiency anemia type     Dr. Randa Evens, MD, MPH [facility] at [facility] Tel- [phone] 03/13/2020 5:15 PM

## 2020-03-18 ENCOUNTER — Inpatient Hospital Stay: Payer: Medicaid Other

## 2020-03-18 ENCOUNTER — Other Ambulatory Visit: Payer: Self-pay

## 2020-03-18 VITALS — BP 123/85 | HR 91 | Temp 98.5°F | Resp 16

## 2020-03-18 DIAGNOSIS — D509 Iron deficiency anemia, unspecified: Secondary | ICD-10-CM | POA: Diagnosis not present

## 2020-03-18 DIAGNOSIS — D500 Iron deficiency anemia secondary to blood loss (chronic): Secondary | ICD-10-CM

## 2020-03-18 MED ORDER — SODIUM CHLORIDE 0.9 % IV SOLN
510.0000 mg | Freq: Once | INTRAVENOUS | Status: AC
  Administered 2020-03-18: 510 mg via INTRAVENOUS
  Filled 2020-03-18: qty 510

## 2020-03-18 MED ORDER — SODIUM CHLORIDE 0.9 % IV SOLN
Freq: Once | INTRAVENOUS | Status: AC
  Filled 2020-03-18: qty 250

## 2020-03-22 ENCOUNTER — Ambulatory Visit (INDEPENDENT_AMBULATORY_CARE_PROVIDER_SITE_OTHER): Payer: Medicaid Other | Admitting: Obstetrics and Gynecology

## 2020-03-22 ENCOUNTER — Other Ambulatory Visit: Payer: Self-pay

## 2020-03-22 ENCOUNTER — Encounter: Payer: Self-pay | Admitting: Obstetrics and Gynecology

## 2020-03-22 ENCOUNTER — Other Ambulatory Visit (HOSPITAL_COMMUNITY)
Admission: RE | Admit: 2020-03-22 | Discharge: 2020-03-22 | Disposition: A | Discharge status: Home / Self Care | Payer: Medicaid Other | Source: Ambulatory Visit | Attending: Obstetrics and Gynecology | Admitting: Obstetrics and Gynecology

## 2020-03-22 VITALS — BP 120/70 | Ht 60.0 in | Wt 202.6 lb

## 2020-03-22 DIAGNOSIS — Z124 Encounter for screening for malignant neoplasm of cervix: Secondary | ICD-10-CM | POA: Insufficient documentation

## 2020-03-22 DIAGNOSIS — N920 Excessive and frequent menstruation with regular cycle: Secondary | ICD-10-CM | POA: Diagnosis not present

## 2020-03-22 DIAGNOSIS — N852 Hypertrophy of uterus: Secondary | ICD-10-CM

## 2020-03-22 NOTE — Progress Notes (Signed)
Patient ID: Lori Bentley, female   DOB: 17-Sep-1985, 34 y.o.   MRN: [id number]  Reason for Consult: Gynecologic Exam (heavy bleeding pt thinks it maybe cused by a herna she has already.)   Referred by Freddy Finner, NP  Subjective:     HPI:  Lori Bentley is a 34 y.o. female. She has been having heavy menstrual bleeding.   Past Medical History:  Diagnosis Date   Anxiety    Asthma    WELL CONTROLLED   Bipolar affective disorder, currently manic, mild ([facility])    Chronic anemia    Depression    Diabetes ([place]) 12/2017   patient denies diabetes   GERD (gastroesophageal reflux disease)    History of blood transfusion 2018   GI BLEED   History of hiatal hernia    Family History  Problem Relation Age of Onset   Anxiety disorder Mother    Asthma Mother    Fibroids Mother    GER disease Father    Past Surgical History:  Procedure Laterality Date   COLONOSCOPY WITH PROPOFOL N/A 09/18/2019   Procedure: COLONOSCOPY WITH PROPOFOL;  Surgeon: [name], MD;  Location: [facility] ENDOSCOPY;  Service: Gastroenterology;  Laterality: N/A;   ESOPHAGOGASTRODUODENOSCOPY (EGD) WITH PROPOFOL N/A 05/18/2016   Procedure: ESOPHAGOGASTRODUODENOSCOPY (EGD) WITH PROPOFOL;  Surgeon: Manya Silvas, MD;  Location: [facility] ENDOSCOPY;  Service: Endoscopy;  Laterality: N/A;   ESOPHAGOGASTRODUODENOSCOPY (EGD) WITH PROPOFOL N/A 09/18/2019   Procedure: ESOPHAGOGASTRODUODENOSCOPY (EGD) WITH PROPOFOL;  Surgeon: [name], MD;  Location: [facility] ENDOSCOPY;  Service: Gastroenterology;  Laterality: N/A;   GIVENS CAPSULE STUDY N/A 11/09/2019   Procedure: GIVENS CAPSULE STUDY;  Surgeon: [name], MD;  Location: [facility] ENDOSCOPY;  Service: Gastroenterology;  Laterality: N/A;   MYOMECTOMY N/A 01/02/2018   Procedure: MYOMECTOMY;  Surgeon: Gae Dry, MD;  Location: [facility] ORS;  Service: Gynecology;  Laterality: N/A;   TUBAL LIGATION  2012    Short Social History:  Social  History   Tobacco Use   Smoking status: Current Every Day Smoker    Packs/day: 0.25    Years: 1.00    Pack years: 0.25    Types: Cigarettes   Smokeless tobacco: Never Used  Substance Use Topics   Alcohol use: Yes    Comment: socially    Allergies  Allergen Reactions   Nsaids     GI BLEED-HAD BLOOD TRASNFUSION   Peanut-Containing Drug Products Itching    Itching in throat   Percocet [Oxycodone-Acetaminophen] Swelling    Swelling of face and throat   Phenylephrine-Guaifenesin Other (See Comments)    Unknown reaction. Does not remember reaction    Current Outpatient Medications  Medication Sig Dispense Refill   ABILIFY MAINTENA 400 MG PRSY prefilled syringe Inject into the muscle.     albuterol (PROVENTIL HFA;VENTOLIN HFA) 108 (90 Base) MCG/ACT inhaler Inhale 2 puffs into the lungs every 6 (six) hours as needed for wheezing or shortness of breath. 1 Inhaler 0   escitalopram (LEXAPRO) 20 MG tablet Take 20 mg by mouth every morning.      eszopiclone (LUNESTA) 2 MG TABS tablet Take 2 mg by mouth at bedtime as needed for sleep. Take immediately before bedtime      ferrous sulfate 325 (65 FE) MG tablet Take 1 tablet (325 mg total) by mouth 2 (two) times daily with a meal. 60 tablet 3   pantoprazole (PROTONIX) 40 MG tablet TAKE 1 TABLET BY MOUTH DAILY 30 tablet 2   No current facility-administered medications  for this visit.    REVIEW OF SYSTEMS      Objective:  Objective   Vitals:   03/22/20 0809  BP: 120/70  Weight: 202 lb 9.6 oz (91.9 kg)  Height: 5' (1.524 m)   Body mass index is 39.57 kg/m.  Physical Exam Vitals and nursing note reviewed.  Constitutional:      Appearance: She is well-developed.  HENT:     Head: Normocephalic and atraumatic.  Eyes:     Pupils: Pupils are equal, round, and reactive to light.  Cardiovascular:     Rate and Rhythm: Normal rate and regular rhythm.  Pulmonary:     Effort: Pulmonary effort is normal. No respiratory  distress.  Abdominal:     Comments: Enlarged fibroid uterus extending to umbilicus  Skin:    General: Skin is warm and dry.  Neurological:     Mental Status: She is alert and oriented to person, place, and time.  Psychiatric:        Behavior: Behavior normal.        Thought Content: Thought content normal.        Judgment: Judgment normal.         Assessment/Plan:     34 yo with menorrhagia and fibroid uterus Enlarged fibroid uterus 15-20cm on exam extending to belly button  Will follow up for [place] Given pictoral blood assessment log Pap smear and STD testing today  More than 25 minutes were spent face to face with the patient in the room, reviewing the medical record, labs and images, and coordinating care for the patient. The plan of management was discussed in detail and counseling was provided.    Adrian Prows MD [facility], [facility] 03/22/2020 8:46 AM

## 2020-03-22 NOTE — Patient Instructions (Signed)

## 2020-03-23 LAB — CYTOLOGY - PAP
Chlamydia: NEGATIVE
Comment: NEGATIVE
Comment: NEGATIVE
Comment: NEGATIVE
Comment: NORMAL
Diagnosis: NEGATIVE
High risk HPV: NEGATIVE
Neisseria Gonorrhea: NEGATIVE
Trichomonas: NEGATIVE

## 2020-03-24 ENCOUNTER — Other Ambulatory Visit: Payer: Self-pay | Admitting: Obstetrics and Gynecology

## 2020-03-24 DIAGNOSIS — B9689 Other specified bacterial agents as the cause of diseases classified elsewhere: Secondary | ICD-10-CM

## 2020-03-24 MED ORDER — METRONIDAZOLE 500 MG PO TABS: 500.0000 mg | ORAL_TABLET | Freq: Two times a day (BID) | ORAL | 0 refills | Status: AC

## 2020-03-25 ENCOUNTER — Other Ambulatory Visit: Payer: Self-pay

## 2020-03-25 ENCOUNTER — Inpatient Hospital Stay: Payer: Medicaid Other

## 2020-03-25 VITALS — BP 114/70 | HR 71 | Temp 98.8°F | Resp 18

## 2020-03-25 DIAGNOSIS — D500 Iron deficiency anemia secondary to blood loss (chronic): Secondary | ICD-10-CM

## 2020-03-25 DIAGNOSIS — D509 Iron deficiency anemia, unspecified: Secondary | ICD-10-CM | POA: Diagnosis not present

## 2020-03-25 MED ORDER — SODIUM CHLORIDE 0.9 % IV SOLN
510.0000 mg | Freq: Once | INTRAVENOUS | Status: AC
  Administered 2020-03-25: 510 mg via INTRAVENOUS
  Filled 2020-03-25: qty 510

## 2020-03-25 MED ORDER — SODIUM CHLORIDE 0.9 % IV SOLN
Freq: Once | INTRAVENOUS | Status: AC
  Filled 2020-03-25: qty 250

## 2020-03-25 NOTE — Progress Notes (Signed)
1218: Pt denies to stay for 30 minutes observation. Pt tolerated infusion well. No s/s of distress or reaction noted. Pt and VS stable at discharge.

## 2020-04-08 ENCOUNTER — Ambulatory Visit (INDEPENDENT_AMBULATORY_CARE_PROVIDER_SITE_OTHER): Payer: Medicaid Other | Admitting: Obstetrics and Gynecology

## 2020-04-08 ENCOUNTER — Ambulatory Visit (INDEPENDENT_AMBULATORY_CARE_PROVIDER_SITE_OTHER): Payer: Medicaid Other

## 2020-04-08 ENCOUNTER — Encounter: Payer: Self-pay | Admitting: Obstetrics and Gynecology

## 2020-04-08 ENCOUNTER — Telehealth: Payer: Self-pay | Admitting: Obstetrics and Gynecology

## 2020-04-08 ENCOUNTER — Other Ambulatory Visit: Payer: Self-pay

## 2020-04-08 ENCOUNTER — Other Ambulatory Visit: Payer: Self-pay | Admitting: Obstetrics and Gynecology

## 2020-04-08 VITALS — BP 120/74 | Ht 60.0 in | Wt 196.8 lb

## 2020-04-08 DIAGNOSIS — N83291 Other ovarian cyst, right side: Secondary | ICD-10-CM

## 2020-04-08 DIAGNOSIS — N852 Hypertrophy of uterus: Secondary | ICD-10-CM

## 2020-04-08 DIAGNOSIS — Z30430 Encounter for insertion of intrauterine contraceptive device: Secondary | ICD-10-CM

## 2020-04-08 DIAGNOSIS — N920 Excessive and frequent menstruation with regular cycle: Secondary | ICD-10-CM

## 2020-04-08 DIAGNOSIS — N83299 Other ovarian cyst, unspecified side: Secondary | ICD-10-CM

## 2020-04-08 NOTE — Telephone Encounter (Signed)
Patient following up on prescription, states Diflucan is supposed to be sent to pharmacy but they do not have.  Please resend.  [employer].

## 2020-04-08 NOTE — Patient Instructions (Signed)

## 2020-04-08 NOTE — Progress Notes (Signed)
Patient ID: Lori Bentley, female   DOB: 11-09-1985, 34 y.o.   MRN: [id number]  Reason for Consult: Gynecologic Exam   Referred by Freddy Finner, NP  Subjective:     HPI:  Lori Bentley is a 34 y.o. female . She is following up for menorrhagia. Did not complete menstrual blood loss pictorial assessment. No fibroids seen on [place], but enlarged complex cyst on right ovary.   Past Medical History:  Diagnosis Date  . Anxiety   . Asthma    WELL CONTROLLED  . Bipolar affective disorder, currently manic, mild ([place])   . Chronic anemia   . Depression   . Diabetes ([place]) 12/2017   patient denies diabetes  . GERD (gastroesophageal reflux disease)   . History of blood transfusion 2018   GI BLEED  . History of hiatal hernia    Family History  Problem Relation Age of Onset  . Anxiety disorder Mother   . Asthma Mother   . Fibroids Mother   . GER disease Father    Past Surgical History:  Procedure Laterality Date  . COLONOSCOPY WITH PROPOFOL N/A 09/18/2019   Procedure: COLONOSCOPY WITH PROPOFOL;  Surgeon: [name], MD;  Location: [facility] ENDOSCOPY;  Service: Gastroenterology;  Laterality: N/A;  . ESOPHAGOGASTRODUODENOSCOPY (EGD) WITH PROPOFOL N/A 05/18/2016   Procedure: ESOPHAGOGASTRODUODENOSCOPY (EGD) WITH PROPOFOL;  Surgeon: Manya Silvas, MD;  Location: [facility] ENDOSCOPY;  Service: Endoscopy;  Laterality: N/A;  . ESOPHAGOGASTRODUODENOSCOPY (EGD) WITH PROPOFOL N/A 09/18/2019   Procedure: ESOPHAGOGASTRODUODENOSCOPY (EGD) WITH PROPOFOL;  Surgeon: [name], MD;  Location: [facility] ENDOSCOPY;  Service: Gastroenterology;  Laterality: N/A;  . GIVENS CAPSULE STUDY N/A 11/09/2019   Procedure: GIVENS CAPSULE STUDY;  Surgeon: [name], MD;  Location: [facility] ENDOSCOPY;  Service: Gastroenterology;  Laterality: N/A;  . MYOMECTOMY N/A 01/02/2018   Procedure: MYOMECTOMY;  Surgeon: Gae Dry, MD;  Location: [facility] ORS;  Service: Gynecology;  Laterality: N/A;  . TUBAL LIGATION   2012    Short Social History:  Social History   Tobacco Use  . Smoking status: Current Every Day Smoker    Packs/day: 0.25    Years: 1.00    Pack years: 0.25    Types: Cigarettes  . Smokeless tobacco: Never Used  Substance Use Topics  . Alcohol use: Yes    Comment: socially    Allergies  Allergen Reactions  . Nsaids     GI BLEED-HAD BLOOD TRASNFUSION  . Peanut-Containing Drug Products Itching    Itching in throat  . Percocet [Oxycodone-Acetaminophen] Swelling    Swelling of face and throat  . Phenylephrine-Guaifenesin Other (See Comments)    Unknown reaction. Does not remember reaction    Current Outpatient Medications  Medication Sig Dispense Refill  . ABILIFY MAINTENA 400 MG PRSY prefilled syringe Inject into the muscle.    . albuterol (PROVENTIL HFA;VENTOLIN HFA) 108 (90 Base) MCG/ACT inhaler Inhale 2 puffs into the lungs every 6 (six) hours as needed for wheezing or shortness of breath. 1 Inhaler 0  . escitalopram (LEXAPRO) 20 MG tablet Take 20 mg by mouth every morning.     . eszopiclone (LUNESTA) 2 MG TABS tablet Take 2 mg by mouth at bedtime as needed for sleep. Take immediately before bedtime     . ferrous sulfate 325 (65 FE) MG tablet Take 1 tablet (325 mg total) by mouth 2 (two) times daily with a meal. 60 tablet 3  . pantoprazole (PROTONIX) 40 MG tablet TAKE 1 TABLET BY MOUTH DAILY 30 tablet  2   No current facility-administered medications for this visit.    Review of Systems  Constitutional: Negative for chills, fatigue, fever and unexpected weight change.  HENT: Negative for trouble swallowing.  Eyes: Negative for loss of vision.  Respiratory: Negative for cough, shortness of breath and wheezing.  Cardiovascular: Negative for chest pain, leg swelling, palpitations and syncope.  GI: Negative for abdominal pain, blood in stool, diarrhea, nausea and vomiting.  GU: Negative for difficulty urinating, dysuria, frequency and hematuria.  Musculoskeletal:  Negative for back pain, leg pain and joint pain.  Skin: Negative for rash.  Neurological: Negative for dizziness, headaches, light-headedness, numbness and seizures.  Psychiatric: Negative for behavioral problem, confusion, depressed mood and sleep disturbance.        Objective:  Objective   Vitals:   04/08/20 1020  BP: 120/74  Weight: 196 lb 12.8 oz (89.3 kg)  Height: 5' (1.524 m)   Body mass index is 38.43 kg/m.  Physical Exam Vitals and nursing note reviewed.  Constitutional:      Appearance: She is well-developed.  HENT:     Head: Normocephalic and atraumatic.  Eyes:     Pupils: Pupils are equal, round, and reactive to light.  Cardiovascular:     Rate and Rhythm: Normal rate and regular rhythm.  Pulmonary:     Effort: Pulmonary effort is normal. No respiratory distress.  Skin:    General: Skin is warm and dry.  Neurological:     Mental Status: She is alert and oriented to person, place, and time.  Psychiatric:        Behavior: Behavior normal.        Thought Content: Thought content normal.        Judgment: Judgment normal.       IUD PROCEDURE NOTE:  Lori Bentley is a 34 y.o. [id number] here for IUD insertion. No GYN concerns.  Last pap smear was normal.  IUD Insertion Procedure Note Patient identified, informed consent performed, consent signed.   Discussed risks of irregular bleeding, cramping, infection, malpositioning or misplacement of the IUD outside the uterus which may require further procedure such as laparoscopy, risk of failure <1%. Time out was performed.  Urine pregnancy test negative.  A bimanual exam showed the uterus to be anteverted.  Speculum placed in the vagina.  Cervix visualized.  Cleaned with Betadine x 2.  Grasped anteriorly with a single tooth tenaculum.  Uterus sounded to 9.5 cm.   Mirena IUD placed per manufacturer's recommendations.  Strings trimmed to 3 cm. Tenaculum was removed, good hemostasis noted.  Patient tolerated  procedure well.   Patient was given post-procedure instructions.  She was advised to have backup contraception for one week.  Patient was also asked to check IUD strings periodically and follow up in 4 weeks for IUD check.       Assessment/Plan:     34 yo [id number] with menorrhagia Desires management with IUD.  Mirnea IUD inserted today  Follow up for string check and [place] to follow complex ovarian cyst in 6-12 weeks   More than 20 minutes were spent face to face with the patient in the room, reviewing the medical record, labs and images, and coordinating care for the patient. The plan of management was discussed in detail and counseling was provided.    Adrian Prows MD [facility], [facility] 04/08/2020 11:09 AM

## 2020-04-11 ENCOUNTER — Other Ambulatory Visit: Payer: Self-pay | Admitting: Obstetrics and Gynecology

## 2020-04-11 ENCOUNTER — Other Ambulatory Visit: Payer: Self-pay | Admitting: Gastroenterology

## 2020-04-11 DIAGNOSIS — B3731 Acute candidiasis of vulva and vagina: Secondary | ICD-10-CM

## 2020-04-11 MED ORDER — FLUCONAZOLE 150 MG PO TABS: 150.0000 mg | ORAL_TABLET | ORAL | 5 refills | Status: AC

## 2020-04-11 NOTE — Telephone Encounter (Signed)
Patient is calling to follow up on medication request please advise

## 2020-04-11 NOTE — Telephone Encounter (Signed)
Rx sent- please notify patient, thank you

## 2020-04-11 NOTE — Telephone Encounter (Signed)
Patient is aware 

## 2020-04-28 ENCOUNTER — Telehealth: Payer: Self-pay | Admitting: *Deleted

## 2020-04-28 DIAGNOSIS — D500 Iron deficiency anemia secondary to blood loss (chronic): Secondary | ICD-10-CM

## 2020-04-28 NOTE — Telephone Encounter (Signed)
Pt called and said that she had a bad menstrual cycle and feels that she needs iron. I spoke to Janese Banks and she said to have her come in and get labs and get iron on same day. I called pt and she said that she did get the [place] in 9/10 and it bled a lot for so many days and then slowed down. She states she was told that it might help her but would take 3-6 months. Pt. Agreeable for labs on 10/5 at 1:30 followed by the iron treatment.

## 2020-05-03 ENCOUNTER — Inpatient Hospital Stay: Payer: Medicaid Other

## 2020-05-23 ENCOUNTER — Ambulatory Visit: Payer: Medicaid Other | Admitting: Obstetrics and Gynecology

## 2020-05-23 ENCOUNTER — Ambulatory Visit: Payer: Medicaid Other

## 2020-06-14 ENCOUNTER — Ambulatory Visit: Payer: Medicaid Other

## 2020-06-14 ENCOUNTER — Inpatient Hospital Stay: Payer: Medicaid Other | Attending: Oncology

## 2020-06-14 DIAGNOSIS — D509 Iron deficiency anemia, unspecified: Secondary | ICD-10-CM | POA: Diagnosis not present

## 2020-06-14 LAB — CBC WITH DIFFERENTIAL/PLATELET
Abs Immature Granulocytes: 0.02 10*3/uL (ref 0.00–0.07)
Basophils Absolute: 0 10*3/uL (ref 0.0–0.1)
Basophils Relative: 0 %
Eosinophils Absolute: 0.5 10*3/uL (ref 0.0–0.5)
Eosinophils Relative: 7 %
HCT: 40.8 % (ref 36.0–46.0)
Hemoglobin: 13.9 g/dL (ref 12.0–15.0)
Immature Granulocytes: 0 %
Lymphocytes Relative: 27 %
Lymphs Abs: 2 10*3/uL (ref 0.7–4.0)
MCH: 27.9 pg (ref 26.0–34.0)
MCHC: 34.1 g/dL (ref 30.0–36.0)
MCV: 81.8 fL (ref 80.0–100.0)
Monocytes Absolute: 0.5 10*3/uL (ref 0.1–1.0)
Monocytes Relative: 7 %
Neutro Abs: 4.2 10*3/uL (ref 1.7–7.7)
Neutrophils Relative %: 59 %
Platelets: 335 10*3/uL (ref 150–400)
RBC: 4.99 MIL/uL (ref 3.87–5.11)
RDW: 15.3 % (ref 11.5–15.5)
WBC: 7.2 10*3/uL (ref 4.0–10.5)
nRBC: 0 % (ref 0.0–0.2)

## 2020-06-14 LAB — FERRITIN: Ferritin: 22 ng/mL (ref 11–307)

## 2020-06-14 LAB — IRON AND TIBC
Iron: 78 ug/dL (ref 28–170)
Saturation Ratios: 24 % (ref 10.4–31.8)
TIBC: 319 ug/dL (ref 250–450)
UIBC: 241 ug/dL

## 2020-06-15 ENCOUNTER — Ambulatory Visit: Payer: Medicaid Other | Admitting: Obstetrics and Gynecology

## 2020-06-15 ENCOUNTER — Ambulatory Visit: Payer: Medicaid Other

## 2020-06-27 ENCOUNTER — Ambulatory Visit (INDEPENDENT_AMBULATORY_CARE_PROVIDER_SITE_OTHER): Payer: Medicaid Other | Admitting: Obstetrics and Gynecology

## 2020-06-27 ENCOUNTER — Other Ambulatory Visit: Payer: Self-pay

## 2020-06-27 ENCOUNTER — Ambulatory Visit (INDEPENDENT_AMBULATORY_CARE_PROVIDER_SITE_OTHER): Payer: Medicaid Other

## 2020-06-27 ENCOUNTER — Encounter: Payer: Self-pay | Admitting: Obstetrics and Gynecology

## 2020-06-27 VITALS — BP 122/70 | Ht 60.0 in | Wt 195.4 lb

## 2020-06-27 DIAGNOSIS — Z30431 Encounter for routine checking of intrauterine contraceptive device: Secondary | ICD-10-CM

## 2020-06-27 DIAGNOSIS — N83299 Other ovarian cyst, unspecified side: Secondary | ICD-10-CM | POA: Diagnosis not present

## 2020-06-27 DIAGNOSIS — N83291 Other ovarian cyst, right side: Secondary | ICD-10-CM

## 2020-06-27 DIAGNOSIS — N926 Irregular menstruation, unspecified: Secondary | ICD-10-CM | POA: Diagnosis not present

## 2020-06-27 MED ORDER — ESTRADIOL 2 MG PO TABS: 2.0000 mg | ORAL_TABLET | Freq: Every day | ORAL | 0 refills | Status: DC

## 2020-06-27 NOTE — Progress Notes (Signed)
Patient ID: Lori Bentley, female   DOB: 03-26-1986, 34 y.o.   MRN: [id number]  Reason for Consult: Gynecologic Exam   Referred by Lori Finner, NP  Subjective:     HPI:  Lori Bentley is a 34 y.o. female.  She presents today for follow-up after IUD insertion.  IUD was inserted in September she reports that since that time she has continued to have on and off vaginal bleeding.  She reports that some days the bleeding is having other days it is light.  Is difficult to find a pattern.  She has difficulty knowing what type of tampon she should wear.  She does not have other complaints at this time.  Her CBC and ferritin levels seems to indicate that her anemia is improving that with IUD in place.   Past Medical History:  Diagnosis Date  . Anxiety   . Asthma    WELL CONTROLLED  . Bipolar affective disorder, currently manic, mild ([place])   . Chronic anemia   . Depression   . Diabetes ([place]) 12/2017   patient denies diabetes  . GERD (gastroesophageal reflux disease)   . History of blood transfusion 2018   GI BLEED  . History of hiatal hernia    Family History  Problem Relation Age of Onset  . Anxiety disorder Mother   . Asthma Mother   . Fibroids Mother   . GER disease Father    Past Surgical History:  Procedure Laterality Date  . COLONOSCOPY WITH PROPOFOL N/A 09/18/2019   Procedure: COLONOSCOPY WITH PROPOFOL;  Surgeon: [name], MD;  Location: [facility] ENDOSCOPY;  Service: Gastroenterology;  Laterality: N/A;  . ESOPHAGOGASTRODUODENOSCOPY (EGD) WITH PROPOFOL N/A 05/18/2016   Procedure: ESOPHAGOGASTRODUODENOSCOPY (EGD) WITH PROPOFOL;  Surgeon: Manya Silvas, MD;  Location: [facility] ENDOSCOPY;  Service: Endoscopy;  Laterality: N/A;  . ESOPHAGOGASTRODUODENOSCOPY (EGD) WITH PROPOFOL N/A 09/18/2019   Procedure: ESOPHAGOGASTRODUODENOSCOPY (EGD) WITH PROPOFOL;  Surgeon: [name], MD;  Location: [facility] ENDOSCOPY;  Service: Gastroenterology;  Laterality: N/A;  .  GIVENS CAPSULE STUDY N/A 11/09/2019   Procedure: GIVENS CAPSULE STUDY;  Surgeon: [name], MD;  Location: [facility] ENDOSCOPY;  Service: Gastroenterology;  Laterality: N/A;  . MYOMECTOMY N/A 01/02/2018   Procedure: MYOMECTOMY;  Surgeon: Gae Dry, MD;  Location: [facility] ORS;  Service: Gynecology;  Laterality: N/A;  . TUBAL LIGATION  2012    Short Social History:  Social History   Tobacco Use  . Smoking status: Current Every Day Smoker    Packs/day: 0.25    Years: 1.00    Pack years: 0.25    Types: Cigarettes  . Smokeless tobacco: Never Used  Substance Use Topics  . Alcohol use: Yes    Comment: socially    Allergies  Allergen Reactions  . Nsaids     GI BLEED-HAD BLOOD TRASNFUSION  . Peanut-Containing Drug Products Itching    Itching in throat  . Percocet [Oxycodone-Acetaminophen] Swelling    Swelling of face and throat  . Phenylephrine-Guaifenesin Other (See Comments)    Unknown reaction. Does not remember reaction    Current Outpatient Medications  Medication Sig Dispense Refill  . ABILIFY MAINTENA 400 MG PRSY prefilled syringe Inject into the muscle.    . albuterol (PROVENTIL HFA;VENTOLIN HFA) 108 (90 Base) MCG/ACT inhaler Inhale 2 puffs into the lungs every 6 (six) hours as needed for wheezing or shortness of breath. 1 Inhaler 0  . escitalopram (LEXAPRO) 20 MG tablet Take 20 mg by mouth every morning.     Marland Kitchen  ferrous sulfate 325 (65 FE) MG tablet Take 1 tablet (325 mg total) by mouth 2 (two) times daily with a meal. 60 tablet 3  . pantoprazole (PROTONIX) 40 MG tablet TAKE 1 TABLET BY MOUTH DAILY 90 tablet 1  . estradiol (ESTRACE) 2 MG tablet Take 1 tablet (2 mg total) by mouth daily for 10 days. 10 tablet 0  . eszopiclone (LUNESTA) 2 MG TABS tablet Take 2 mg by mouth at bedtime as needed for sleep. Take immediately before bedtime      No current facility-administered medications for this visit.    Review of Systems  Constitutional: Negative for chills, fatigue, fever  and unexpected weight change.  HENT: Negative for trouble swallowing.  Eyes: Negative for loss of vision.  Respiratory: Negative for cough, shortness of breath and wheezing.  Cardiovascular: Negative for chest pain, leg swelling, palpitations and syncope.  GI: Negative for abdominal pain, blood in stool, diarrhea, nausea and vomiting.  GU: Negative for difficulty urinating, dysuria, frequency and hematuria.  Musculoskeletal: Negative for back pain, leg pain and joint pain.  Skin: Negative for rash.  Neurological: Negative for dizziness, headaches, light-headedness, numbness and seizures.  Psychiatric: Negative for behavioral problem, confusion, depressed mood and sleep disturbance.        Objective:  Objective   Vitals:   06/27/20 1141  BP: 122/70  Weight: 195 lb 6.4 oz (88.6 kg)  Height: 5' (1.524 m)   Body mass index is 38.16 kg/m.  Physical Exam Vitals and nursing note reviewed.  Constitutional:      Appearance: She is well-developed.  HENT:     Head: Normocephalic and atraumatic.  Eyes:     Pupils: Pupils are equal, round, and reactive to light.  Cardiovascular:     Rate and Rhythm: Normal rate and regular rhythm.  Pulmonary:     Effort: Pulmonary effort is normal. No respiratory distress.  Skin:    General: Skin is warm and dry.  Neurological:     Mental Status: She is alert and oriented to person, place, and time.  Psychiatric:        Behavior: Behavior normal.        Thought Content: Thought content normal.        Judgment: Judgment normal.     Assessment/Plan:     34 yo with IUD IUD in place on [place]. Thin endometrium, likely some secondary atrophic bleeding. Discussed trial of OCP for 1 month vs 10 days estradiol. Patient reports estradiol for 10 days. Rx sent.   Complex ovarian cyst have resolved.    Offerred follow up for bleeding, patient prefers follow up as needed.   More than 25 minutes were spent face to face with the patient in the room,  reviewing the medical record, labs and images, and coordinating care for the patient. The plan of management was discussed in detail and counseling was provided.      Lori Prows MD [facility], [facility] 06/27/2020 12:09 PM

## 2020-06-27 NOTE — Progress Notes (Signed)
IUD CHECK UP.  PT STATES SHE HAS BEEN BLEEDING MORE WITH THE IUD.

## 2020-06-27 NOTE — Patient Instructions (Signed)

## 2020-08-02 ENCOUNTER — Emergency Department: Payer: Medicaid Other

## 2020-08-02 ENCOUNTER — Other Ambulatory Visit: Payer: Self-pay

## 2020-08-02 ENCOUNTER — Encounter: Payer: Self-pay | Admitting: *Deleted

## 2020-08-02 ENCOUNTER — Emergency Department
Admission: EM | Admit: 2020-08-02 | Discharge: 2020-08-02 | Disposition: A | Discharge status: Home / Self Care | Payer: Medicaid Other | Attending: Student in an Organized Health Care Education/Training Program | Admitting: Student in an Organized Health Care Education/Training Program

## 2020-08-02 DIAGNOSIS — R519 Headache, unspecified: Secondary | ICD-10-CM | POA: Insufficient documentation

## 2020-08-02 DIAGNOSIS — F17210 Nicotine dependence, cigarettes, uncomplicated: Secondary | ICD-10-CM | POA: Insufficient documentation

## 2020-08-02 DIAGNOSIS — Z20822 Contact with and (suspected) exposure to covid-19: Secondary | ICD-10-CM | POA: Diagnosis not present

## 2020-08-02 DIAGNOSIS — Z91010 Allergy to peanuts: Secondary | ICD-10-CM | POA: Insufficient documentation

## 2020-08-02 DIAGNOSIS — R079 Chest pain, unspecified: Secondary | ICD-10-CM | POA: Diagnosis present

## 2020-08-02 DIAGNOSIS — J45909 Unspecified asthma, uncomplicated: Secondary | ICD-10-CM | POA: Diagnosis not present

## 2020-08-02 DIAGNOSIS — R0602 Shortness of breath: Secondary | ICD-10-CM | POA: Diagnosis not present

## 2020-08-02 DIAGNOSIS — E119 Type 2 diabetes mellitus without complications: Secondary | ICD-10-CM | POA: Diagnosis not present

## 2020-08-02 DIAGNOSIS — R0981 Nasal congestion: Secondary | ICD-10-CM | POA: Diagnosis not present

## 2020-08-02 LAB — BASIC METABOLIC PANEL
Anion gap: 7 (ref 5–15)
BUN: 7 mg/dL (ref 6–20)
CO2: 26 mmol/L (ref 22–32)
Calcium: 8.7 mg/dL — ABNORMAL LOW (ref 8.9–10.3)
Chloride: 105 mmol/L (ref 98–111)
Creatinine, Ser: 0.86 mg/dL (ref 0.44–1.00)
GFR, Estimated: >60 mL/min (ref >60)
Glucose, Bld: 94 mg/dL (ref 70–99)
Potassium: 3.6 mmol/L (ref 3.5–5.1)
Sodium: 138 mmol/L (ref 135–145)

## 2020-08-02 LAB — CBC
HCT: 38.2 % (ref 36.0–46.0)
Hemoglobin: 12.7 g/dL (ref 12.0–15.0)
MCH: 28.5 pg (ref 26.0–34.0)
MCHC: 33.2 g/dL (ref 30.0–36.0)
MCV: 85.8 fL (ref 80.0–100.0)
Platelets: 345 10*3/uL (ref 150–400)
RBC: 4.45 MIL/uL (ref 3.87–5.11)
RDW: 13.6 % (ref 11.5–15.5)
WBC: 4.9 10*3/uL (ref 4.0–10.5)
nRBC: 0 % (ref 0.0–0.2)

## 2020-08-02 LAB — TROPONIN I (HIGH SENSITIVITY): Troponin I (High Sensitivity): 4 ng/L (ref <18)

## 2020-08-02 MED ORDER — ACETAMINOPHEN 500 MG PO TABS
1000.0000 mg | ORAL_TABLET | Freq: Once | ORAL | Status: AC
  Administered 2020-08-02: 1000 mg via ORAL
  Filled 2020-08-02: qty 2

## 2020-08-02 NOTE — ED Provider Notes (Signed)
[facility] Emergency Department Provider Note    Event Date/Time   First MD Initiated Contact with Patient 08/02/20 2107     (approximate)  I have reviewed the triage vital signs and the nursing notes.   HISTORY  Chief Complaint Shortness of Breath    HPI Lori Bentley is a 35 y.o. female with the below listed past medical history presents to the ER for chest discomfort headache congestion concern that she has Covid.  States she has a frontal headache.  No numbness or tingling.  No known sick contacts.  No measured fevers.  No neck stiffness.  Does have a history of asthma does not feel like she is wheezing or significant short of breath.  Does not have any discomfort with taking deep inspiration.  She is not any birth control no history of DVT.  No recent immobilization.    Past Medical History:  Diagnosis Date  . Anxiety   . Asthma    WELL CONTROLLED  . Bipolar affective disorder, currently manic, mild ([place])   . Chronic anemia   . Depression   . Diabetes ([place]) 12/2017   patient denies diabetes  . GERD (gastroesophageal reflux disease)   . History of blood transfusion 2018   GI BLEED  . History of hiatal hernia    Family History  Problem Relation Age of Onset  . Anxiety disorder Mother   . Asthma Mother   . Fibroids Mother   . GER disease Father    Past Surgical History:  Procedure Laterality Date  . COLONOSCOPY WITH PROPOFOL N/A 09/18/2019   Procedure: COLONOSCOPY WITH PROPOFOL;  Surgeon: [name], MD;  Location: [facility] ENDOSCOPY;  Service: Gastroenterology;  Laterality: N/A;  . ESOPHAGOGASTRODUODENOSCOPY (EGD) WITH PROPOFOL N/A 05/18/2016   Procedure: ESOPHAGOGASTRODUODENOSCOPY (EGD) WITH PROPOFOL;  Surgeon: Manya Silvas, MD;  Location: [facility] ENDOSCOPY;  Service: Endoscopy;  Laterality: N/A;  . ESOPHAGOGASTRODUODENOSCOPY (EGD) WITH PROPOFOL N/A 09/18/2019   Procedure: ESOPHAGOGASTRODUODENOSCOPY (EGD) WITH PROPOFOL;   Surgeon: [name], MD;  Location: [facility] ENDOSCOPY;  Service: Gastroenterology;  Laterality: N/A;  . GIVENS CAPSULE STUDY N/A 11/09/2019   Procedure: GIVENS CAPSULE STUDY;  Surgeon: [name], MD;  Location: [facility] ENDOSCOPY;  Service: Gastroenterology;  Laterality: N/A;  . MYOMECTOMY N/A 01/02/2018   Procedure: MYOMECTOMY;  Surgeon: Gae Dry, MD;  Location: [facility] ORS;  Service: Gynecology;  Laterality: N/A;  . TUBAL LIGATION  2012   Patient Active Problem List   Diagnosis Date Noted  . GIB (gastrointestinal bleeding) 03/27/2019  . GI bleed 03/27/2019  . Fibroids 12/09/2017  . IDA (iron deficiency anemia) 05/16/2016  . New onset of headaches 05/16/2016  . Menorrhagia with regular cycle 05/16/2016  . Dehydration 05/16/2016  . Nausea with vomiting 05/16/2016  . Blood loss anemia 04/23/2016  . Hematemesis 04/23/2016  . Melena 04/23/2016  . Diabetes ([place]) 04/23/2016  . GERD (gastroesophageal reflux disease) 04/23/2016      Prior to Admission medications   Medication Sig Start Date End Date Taking? Authorizing Provider  ABILIFY MAINTENA 400 MG PRSY prefilled syringe Inject into the muscle. 02/23/20   [provider]  albuterol (PROVENTIL HFA;VENTOLIN HFA) 108 (90 Base) MCG/ACT inhaler Inhale 2 puffs into the lungs every 6 (six) hours as needed for wheezing or shortness of breath. 11/29/17   Darel Hong, MD  escitalopram (LEXAPRO) 20 MG tablet Take 20 mg by mouth every morning.  12/10/14   [provider]  estradiol (ESTRACE) 2 MG tablet Take 1  tablet (2 mg total) by mouth daily for 10 days. 06/27/20 07/07/20  Schuman, Jaquelyn Bitter, MD  eszopiclone (LUNESTA) 2 MG TABS tablet Take 2 mg by mouth at bedtime as needed for sleep. Take immediately before bedtime     [provider]  ferrous sulfate 325 (65 FE) MG tablet Take 1 tablet (325 mg total) by mouth 2 (two) times daily with a meal. 03/28/19   Enid Baas, MD  pantoprazole (PROTONIX) 40 MG tablet TAKE  1 TABLET BY MOUTH DAILY 04/11/20   Wyline Mood, MD    Allergies Nsaids, Peanut-containing drug products, Percocet [oxycodone-acetaminophen], and Phenylephrine-guaifenesin    Social History Social History   Tobacco Use  . Smoking status: Current Every Day Smoker    Packs/day: 0.25    Years: 1.00    Pack years: 0.25    Types: Cigarettes  . Smokeless tobacco: Never Used  Vaping Use  . Vaping Use: Never used  Substance Use Topics  . Alcohol use: Not Currently    Comment: socially  . Drug use: Yes    Types: Marijuana    Comment: occasionally     Review of Systems Patient denies headaches, rhinorrhea, blurry vision, numbness, shortness of breath, chest pain, edema, cough, abdominal pain, nausea, vomiting, diarrhea, dysuria, fevers, rashes or hallucinations unless otherwise stated above in HPI. ____________________________________________   PHYSICAL EXAM:  VITAL SIGNS: Vitals:   08/02/20 1806  BP: 110/75  Pulse: 80  Resp: 20  Temp: 98.5 F (36.9 C)  SpO2: 99%    Constitutional: Alert and oriented.  Eyes: Conjunctivae are normal.  Head: Atraumatic. Nose: No congestion/rhinnorhea. Mouth/Throat: Mucous membranes are moist.   Neck: No stridor. Painless ROM.  Cardiovascular: Normal rate, regular rhythm. Grossly normal heart sounds.  Good peripheral circulation. Respiratory: Normal respiratory effort.  No retractions. Lungs CTAB. Gastrointestinal: Soft and nontender. No distention. No abdominal bruits. No CVA tenderness. Genitourinary:  Musculoskeletal: No lower extremity tenderness nor edema.  No joint effusions. Neurologic:  Normal speech and language. No gross focal neurologic deficits are appreciated. No facial droop Skin:  Skin is warm, dry and intact. No rash noted. Psychiatric: Mood and affect are normal. Speech and behavior are normal.  ____________________________________________   LABS (all labs ordered are listed, but only abnormal results are  displayed)  Results for orders placed or performed during the hospital encounter of 08/02/20 (from the past 24 hour(s))  Basic metabolic panel     Status: Abnormal   Collection Time: 08/02/20  6:10 PM  Result Value Ref Range   Sodium 138 135 - 145 mmol/L   Potassium 3.6 3.5 - 5.1 mmol/L   Chloride 105 98 - 111 mmol/L   CO2 26 22 - 32 mmol/L   Glucose, Bld 94 70 - 99 mg/dL   BUN 7 6 - 20 mg/dL   Creatinine, Ser 1.93 0.44 - 1.00 mg/dL   Calcium 8.7 (L) 8.9 - 10.3 mg/dL   GFR, Estimated >79 >02 mL/min   Anion gap 7 5 - 15  CBC     Status: None   Collection Time: 08/02/20  6:10 PM  Result Value Ref Range   WBC 4.9 4.0 - 10.5 K/uL   RBC 4.45 3.87 - 5.11 MIL/uL   Hemoglobin 12.7 12.0 - 15.0 g/dL   HCT 40.9 73.5 - 32.9 %   MCV 85.8 80.0 - 100.0 fL   MCH 28.5 26.0 - 34.0 pg   MCHC 33.2 30.0 - 36.0 g/dL   RDW 92.4 26.8 - 34.1 %  Platelets 345 150 - 400 K/uL   nRBC 0.0 0.0 - 0.2 %  Troponin I (High Sensitivity)     Status: None   Collection Time: 08/02/20  6:10 PM  Result Value Ref Range   Troponin I (High Sensitivity) 4 <18 ng/L   ____________________________________________  EKG My review and personal interpretation at Time: 18:09   Indication: chest pain  Rate: 80  Rhythm: sinus Axis: normal Other: nonspecific tw abn, c/w previous ____________________________________________  RADIOLOGY  I personally reviewed all radiographic images ordered to evaluate for the above acute complaints and reviewed radiology reports and findings.  These findings were personally discussed with the patient.  Please see medical record for radiology report. ____________________________________________   PROCEDURES  Procedure(s) performed:  Procedures    Critical Care performed: no ____________________________________________   INITIAL IMPRESSION / ASSESSMENT AND PLAN / ED COURSE  Pertinent labs & imaging results that were available during my care of the patient were reviewed by me and  considered in my medical decision making (see chart for details).   DDX: Pneumonia, Covid, ACS, PE, tension, cluster, migraine, URI  Lori Bentley is a 35 y.o. who presents to the ED with presentation as described above.  Patient well-appearing hemodynamically stable afebrile in no acute distress.  EKG is unchanged from previous troponin negative.  This not consistent with ACS.  She is low risk by Wells criteria and is PERC negative.  No evidence of pneumothorax or edema on chest x-ray no evidence of infiltrates.  Will give Tylenol for symptom management.  Symptoms are concerning for Covid therefore will test but otherwise patient appears appropriate for outpatient follow-up.     The patient was evaluated in Emergency Department today for the symptoms described in the history of present illness. He/she was evaluated in the context of the global COVID-19 pandemic, which necessitated consideration that the patient might be at risk for infection with the SARS-CoV-2 virus that causes COVID-19. Institutional protocols and algorithms that pertain to the evaluation of patients at risk for COVID-19 are in a state of rapid change based on information released by regulatory bodies including the CDC and federal and state organizations. These policies and algorithms were followed during the patient's care in the ED.  As part of my medical decision making, I reviewed the following data within the [place] notes reviewed and incorporated, Labs reviewed, notes from prior ED visits and [place] Controlled Substance Database   ____________________________________________   FINAL CLINICAL IMPRESSION(S) / ED DIAGNOSES  Final diagnoses:  Chest pain, unspecified type      NEW MEDICATIONS STARTED DURING THIS VISIT:  New Prescriptions   No medications on file     Note:  This document was prepared using Dragon voice recognition software and may include unintentional dictation  errors.    Merlyn Lot, MD 08/02/20 2117

## 2020-08-02 NOTE — ED Triage Notes (Signed)
Pt to triage via wheelchair.  Pt reports bodyaches,sob and chest pain.  Sx began today.  No cough .  cig smoker.  Pt alert  Speech clear.

## 2020-08-03 LAB — SARS CORONAVIRUS 2 (TAT 6-24 HRS): SARS Coronavirus 2: NEGATIVE

## 2020-09-13 ENCOUNTER — Inpatient Hospital Stay: Payer: Medicaid Other

## 2020-09-13 ENCOUNTER — Telehealth: Payer: Self-pay | Admitting: Oncology

## 2020-09-13 ENCOUNTER — Inpatient Hospital Stay: Payer: Medicaid Other | Admitting: Oncology

## 2020-09-13 ENCOUNTER — Ambulatory Visit: Payer: Medicaid Other

## 2020-09-13 NOTE — Telephone Encounter (Signed)
Pt called to cancel 2/15 appt for lab and MD. Pt will contact office when she would like to reschedule.

## 2020-09-14 ENCOUNTER — Telehealth: Payer: Self-pay | Admitting: Oncology

## 2020-09-14 NOTE — Telephone Encounter (Signed)
Called pt to r/s her cancelled appt on 2/15. Pt stated that she would like to "hold off for now" and would call back when ready.

## 2020-09-15 ENCOUNTER — Emergency Department
Admission: EM | Admit: 2020-09-15 | Discharge: 2020-09-15 | Disposition: A | Discharge status: Home / Self Care | Payer: No Typology Code available for payment source | Attending: Emergency Medicine | Admitting: Emergency Medicine

## 2020-09-15 ENCOUNTER — Encounter: Payer: Self-pay | Admitting: Emergency Medicine

## 2020-09-15 ENCOUNTER — Other Ambulatory Visit: Payer: Self-pay

## 2020-09-15 DIAGNOSIS — J45909 Unspecified asthma, uncomplicated: Secondary | ICD-10-CM | POA: Diagnosis not present

## 2020-09-15 DIAGNOSIS — F17210 Nicotine dependence, cigarettes, uncomplicated: Secondary | ICD-10-CM | POA: Diagnosis not present

## 2020-09-15 DIAGNOSIS — F419 Anxiety disorder, unspecified: Secondary | ICD-10-CM | POA: Insufficient documentation

## 2020-09-15 DIAGNOSIS — F410 Panic disorder [episodic paroxysmal anxiety] without agoraphobia: Secondary | ICD-10-CM

## 2020-09-15 DIAGNOSIS — Z91010 Allergy to peanuts: Secondary | ICD-10-CM | POA: Insufficient documentation

## 2020-09-15 DIAGNOSIS — E119 Type 2 diabetes mellitus without complications: Secondary | ICD-10-CM | POA: Insufficient documentation

## 2020-09-15 MED ORDER — LORAZEPAM 2 MG PO TABS
2.0000 mg | ORAL_TABLET | Freq: Once | ORAL | Status: AC
  Administered 2020-09-15: 2 mg via ORAL
  Filled 2020-09-15: qty 1

## 2020-09-15 NOTE — ED Provider Notes (Signed)
[facility] Emergency Department Provider Note  ____________________________________________  Time seen: Approximately 5:54 AM  I have reviewed the triage vital signs and the nursing notes.   HISTORY  Chief Complaint Anxiety   HPI Brandee Jalaila Caradonna is a 35 y.o. female with a history of anxiety and depression who presents for evaluation of anxiety attack. Patient reports that she was asleep when she woke up startled at 2 AM. She reports that she feels that her mind is racing she has been unable to sleep throughout the entire night. She denies any chest pain, hyperventilation, or shortness of breath. Patient reports that her anxiety is pretty severe and she has these episodes very frequently. She was recently started on Klonopin every other day and has been taking it for 2 weeks. She has not noticed any changes in her symptoms. She has been taken off of Lexapro and also hydroxyzine but this evening she try to take a half a tablet of hydroxyzine to help her go back to sleep unsuccessfully. She is in the process of switching mental health providers and had her intake appointment yesterday. She denies any suicidal or homicidal thoughts. She reports that she continues to feel like her mind is racing.   Past Medical History:  Diagnosis Date  . Anxiety   . Asthma    WELL CONTROLLED  . Bipolar affective disorder, currently manic, mild ([place])   . Chronic anemia   . Depression   . Diabetes ([place]) 12/2017   patient denies diabetes  . GERD (gastroesophageal reflux disease)   . History of blood transfusion 2018   GI BLEED  . History of hiatal hernia     Patient Active Problem List   Diagnosis Date Noted  . GIB (gastrointestinal bleeding) 03/27/2019  . GI bleed 03/27/2019  . Fibroids 12/09/2017  . IDA (iron deficiency anemia) 05/16/2016  . New onset of headaches 05/16/2016  . Menorrhagia with regular cycle 05/16/2016  . Dehydration 05/16/2016  . Nausea with  vomiting 05/16/2016  . Blood loss anemia 04/23/2016  . Hematemesis 04/23/2016  . Melena 04/23/2016  . Diabetes ([place]) 04/23/2016  . GERD (gastroesophageal reflux disease) 04/23/2016    Past Surgical History:  Procedure Laterality Date  . COLONOSCOPY WITH PROPOFOL N/A 09/18/2019   Procedure: COLONOSCOPY WITH PROPOFOL;  Surgeon: [name], MD;  Location: [facility] ENDOSCOPY;  Service: Gastroenterology;  Laterality: N/A;  . ESOPHAGOGASTRODUODENOSCOPY (EGD) WITH PROPOFOL N/A 05/18/2016   Procedure: ESOPHAGOGASTRODUODENOSCOPY (EGD) WITH PROPOFOL;  Surgeon: Manya Silvas, MD;  Location: [facility] ENDOSCOPY;  Service: Endoscopy;  Laterality: N/A;  . ESOPHAGOGASTRODUODENOSCOPY (EGD) WITH PROPOFOL N/A 09/18/2019   Procedure: ESOPHAGOGASTRODUODENOSCOPY (EGD) WITH PROPOFOL;  Surgeon: [name], MD;  Location: [facility] ENDOSCOPY;  Service: Gastroenterology;  Laterality: N/A;  . GIVENS CAPSULE STUDY N/A 11/09/2019   Procedure: GIVENS CAPSULE STUDY;  Surgeon: [name], MD;  Location: [facility] ENDOSCOPY;  Service: Gastroenterology;  Laterality: N/A;  . MYOMECTOMY N/A 01/02/2018   Procedure: MYOMECTOMY;  Surgeon: Gae Dry, MD;  Location: [facility] ORS;  Service: Gynecology;  Laterality: N/A;  . TUBAL LIGATION  2012    Prior to Admission medications   Medication Sig Start Date End Date Taking? Authorizing Provider  ABILIFY MAINTENA 400 MG PRSY prefilled syringe Inject into the muscle. 02/23/20   [provider]  albuterol (PROVENTIL HFA;VENTOLIN HFA) 108 (90 Base) MCG/ACT inhaler Inhale 2 puffs into the lungs every 6 (six) hours as needed for wheezing or shortness of breath. 11/29/17   Darel Hong, MD  escitalopram (LEXAPRO) 20 MG tablet Take 20 mg by mouth every morning.  12/10/14   [provider]  estradiol (ESTRACE) 2 MG tablet Take 1 tablet (2 mg total) by mouth daily for 10 days. 06/27/20 07/07/20  Schuman, Stefanie Libel, MD  eszopiclone (LUNESTA) 2 MG TABS tablet Take 2 mg by mouth at bedtime  as needed for sleep. Take immediately before bedtime     [provider]  ferrous sulfate 325 (65 FE) MG tablet Take 1 tablet (325 mg total) by mouth 2 (two) times daily with a meal. 03/28/19   Gladstone Lighter, MD  pantoprazole (PROTONIX) 40 MG tablet TAKE 1 TABLET BY MOUTH DAILY 04/11/20   [name], MD    Allergies Nsaids, Peanut-containing drug products, Percocet [oxycodone-acetaminophen], and Phenylephrine-guaifenesin  Family History  Problem Relation Age of Onset  . Anxiety disorder Mother   . Asthma Mother   . Fibroids Mother   . GER disease Father     Social History Social History   Tobacco Use  . Smoking status: Current Every Day Smoker    Packs/day: 0.25    Years: 1.00    Pack years: 0.25    Types: Cigarettes  . Smokeless tobacco: Never Used  Vaping Use  . Vaping Use: Never used  Substance Use Topics  . Alcohol use: Not Currently    Comment: socially  . Drug use: Yes    Types: Marijuana    Comment: occasionally     Review of Systems  Constitutional: Negative for fever. Eyes: Negative for visual changes. ENT: Negative for sore throat. Neck: No neck pain  Cardiovascular: Negative for chest pain. Respiratory: Negative for shortness of breath. Gastrointestinal: Negative for abdominal pain, vomiting or diarrhea. Genitourinary: Negative for dysuria. Musculoskeletal: Negative for back pain. Skin: Negative for rash. Neurological: Negative for headaches, weakness or numbness. Psych: No SI or HI. + anxiety  ____________________________________________   PHYSICAL EXAM:  VITAL SIGNS: ED Triage Vitals  Enc Vitals Group     BP 09/15/20 0519 (!) 122/93     Pulse Rate 09/15/20 0519 88     Resp 09/15/20 0519 18     Temp 09/15/20 0519 97.9 F (36.6 C)     Temp Source 09/15/20 0519 Oral     SpO2 09/15/20 0519 99 %     Weight 09/15/20 0520 170 lb (77.1 kg)     Height 09/15/20 0520 5' (1.524 m)     Head Circumference --      Peak Flow --      Pain  Score 09/15/20 0519 0     Pain Loc --      Pain Edu? --      Excl. in [place]? --     Constitutional: Alert and oriented. Well appearing and in no apparent distress. HEENT:      Head: Normocephalic and atraumatic.         Eyes: Conjunctivae are normal. Sclera is non-icteric.       Mouth/Throat: Mucous membranes are moist.       Neck: Supple with no signs of meningismus. Cardiovascular: Regular rate and rhythm.  Respiratory: Normal respiratory effort.  Gastrointestinal: Soft, non tender, and non distended. Musculoskeletal: No edema, cyanosis, or erythema of extremities. Neurologic: Normal speech and language. Face is symmetric. Moving all extremities. No gross focal neurologic deficits are appreciated. Skin: Skin is warm, dry and intact. No rash noted. Psychiatric: Mood and affect are normal. Speech and behavior are normal.  ____________________________________________   LABS (all labs  ordered are listed, but only abnormal results are displayed)  Labs Reviewed - No data to display ____________________________________________  EKG  none  ____________________________________________  RADIOLOGY  none  ____________________________________________   PROCEDURES  Procedure(s) performed: None Procedures Critical Care performed:  None ____________________________________________   INITIAL IMPRESSION / ASSESSMENT AND PLAN / ED COURSE  35 y.o. female with a history of anxiety and depression who presents for evaluation of anxiety attack. She is well-appearing in no distress with normal vital signs, does not look anxious at this time. No SI or HI. Patient has had several recent medication changes which could be contributing to her symptoms. Will give a dose of p.o. Ativan. No indication for IVC. Recommended that she discuss with her psychiatrist about maybe increasing Klonopin to once a day instead of every other day to see if that helps. Discussed my standard return precautions for any  signs of suicidal thoughts. Patient be discharged home to the care of her husband. Unfortunately I am unable to see patient psychiatrist notes on epic    _________________________ 6:38 AM on 09/15/2020 -----------------------------------------  Patient monitored for an hour after Ativan with resolution of her anxiety.  She be discharged to the care of her husband.   Please note:  Patient was evaluated in Emergency Department today for the symptoms described in the history of present illness. Patient was evaluated in the context of the global COVID-19 pandemic, which necessitated consideration that the patient might be at risk for infection with the SARS-CoV-2 virus that causes COVID-19. Institutional protocols and algorithms that pertain to the evaluation of patients at risk for COVID-19 are in a state of rapid change based on information released by regulatory bodies including the CDC and federal and state organizations. These policies and algorithms were followed during the patient's care in the ED.  Some ED evaluations and interventions may be delayed as a result of limited staffing during the pandemic.   ____________________________________________   FINAL CLINICAL IMPRESSION(S) / ED DIAGNOSES   Final diagnoses:  Anxiety attack      NEW MEDICATIONS STARTED DURING THIS VISIT:  ED Discharge Orders    None       Note:  This document was prepared using Dragon voice recognition software and may include unintentional dictation errors.    Alfred Levins, [place], MD 09/15/20 [phone]

## 2020-09-15 NOTE — ED Triage Notes (Signed)
Patient ambulatory to triage with steady gait, without difficulty or distress noted; pt reports recent medication issues and having difficulty with anxiety

## 2020-10-10 ENCOUNTER — Encounter: Payer: Self-pay | Admitting: Oncology

## 2020-10-25 ENCOUNTER — Emergency Department
Admission: EM | Admit: 2020-10-25 | Discharge: 2020-10-25 | Disposition: A | Discharge status: Home / Self Care | Payer: No Typology Code available for payment source | Attending: Emergency Medicine | Admitting: Emergency Medicine

## 2020-10-25 ENCOUNTER — Other Ambulatory Visit: Payer: Self-pay

## 2020-10-25 DIAGNOSIS — F419 Anxiety disorder, unspecified: Secondary | ICD-10-CM | POA: Diagnosis present

## 2020-10-25 DIAGNOSIS — Z5321 Procedure and treatment not carried out due to patient leaving prior to being seen by health care provider: Secondary | ICD-10-CM | POA: Insufficient documentation

## 2020-10-25 NOTE — ED Triage Notes (Signed)
Pt in with co anxiety states takes multiple meds for the same and sees therapist. States tonight still not able to sleep after taking meds, not here to see psych just wants medicines to help relax and rest.

## 2020-10-26 ENCOUNTER — Emergency Department
Admission: EM | Admit: 2020-10-26 | Discharge: 2020-10-27 | Disposition: A | Discharge status: Home / Self Care | Payer: No Typology Code available for payment source | Source: Home / Self Care | Attending: Emergency Medicine | Admitting: Emergency Medicine

## 2020-10-26 ENCOUNTER — Other Ambulatory Visit: Payer: Self-pay

## 2020-10-26 ENCOUNTER — Encounter: Payer: Self-pay | Admitting: Emergency Medicine

## 2020-10-26 ENCOUNTER — Emergency Department
Admission: EM | Admit: 2020-10-26 | Discharge: 2020-10-26 | Disposition: A | Discharge status: Home / Self Care | Payer: No Typology Code available for payment source | Attending: Emergency Medicine | Admitting: Emergency Medicine

## 2020-10-26 DIAGNOSIS — Z91010 Allergy to peanuts: Secondary | ICD-10-CM | POA: Insufficient documentation

## 2020-10-26 DIAGNOSIS — J45909 Unspecified asthma, uncomplicated: Secondary | ICD-10-CM | POA: Diagnosis not present

## 2020-10-26 DIAGNOSIS — F419 Anxiety disorder, unspecified: Secondary | ICD-10-CM | POA: Insufficient documentation

## 2020-10-26 DIAGNOSIS — E119 Type 2 diabetes mellitus without complications: Secondary | ICD-10-CM | POA: Insufficient documentation

## 2020-10-26 DIAGNOSIS — F17210 Nicotine dependence, cigarettes, uncomplicated: Secondary | ICD-10-CM | POA: Insufficient documentation

## 2020-10-26 MED ORDER — LORAZEPAM 1 MG PO TABS
1.0000 mg | ORAL_TABLET | Freq: Once | ORAL | Status: AC
  Administered 2020-10-26: 1 mg via ORAL
  Filled 2020-10-26: qty 1

## 2020-10-26 NOTE — ED Provider Notes (Addendum)
[facility] Emergency Department Provider Note  ____________________________________________   Event Date/Time   First MD Initiated Contact with Patient 10/26/20 [phone]     (approximate)  I have reviewed the triage vital signs and the nursing notes.   HISTORY  Chief Complaint No chief complaint on file.    HPI Lori Bentley is a 35 y.o. female with history of bipolar disorder, anxiety who presents to the emergency department with anxiety.  States that when she feels anxious she feels like her heart is racing and feels very jittery.  No chest pain or shortness of breath.  No vomiting or diarrhea.  No fever.  She denies SI, HI or hallucinations.  She states she has been on Wellbutrin and BuSpar prescribed by her psychiatrist but they have been making her anxiety worse.  She was told to stop these medications.  She was prescribed hydroxyzine by urgent care but states this makes her too drowsy.  She is requesting something for anxiety now.  She states she has an appointment to see her psychiatrist tomorrow.  She does not want to see psychiatry here in the ED.        Past Medical History:  Diagnosis Date  . Anxiety   . Asthma    WELL CONTROLLED  . Bipolar affective disorder, currently manic, mild ([place])   . Chronic anemia   . Depression   . Diabetes ([place]) 12/2017   patient denies diabetes  . GERD (gastroesophageal reflux disease)   . History of blood transfusion 2018   GI BLEED  . History of hiatal hernia     Patient Active Problem List   Diagnosis Date Noted  . GIB (gastrointestinal bleeding) 03/27/2019  . GI bleed 03/27/2019  . Fibroids 12/09/2017  . IDA (iron deficiency anemia) 05/16/2016  . New onset of headaches 05/16/2016  . Menorrhagia with regular cycle 05/16/2016  . Dehydration 05/16/2016  . Nausea with vomiting 05/16/2016  . Blood loss anemia 04/23/2016  . Hematemesis 04/23/2016  . Melena 04/23/2016  . Diabetes ([place])  04/23/2016  . GERD (gastroesophageal reflux disease) 04/23/2016    Past Surgical History:  Procedure Laterality Date  . COLONOSCOPY WITH PROPOFOL N/A 09/18/2019   Procedure: COLONOSCOPY WITH PROPOFOL;  Surgeon: [name], MD;  Location: [facility] ENDOSCOPY;  Service: Gastroenterology;  Laterality: N/A;  . ESOPHAGOGASTRODUODENOSCOPY (EGD) WITH PROPOFOL N/A 05/18/2016   Procedure: ESOPHAGOGASTRODUODENOSCOPY (EGD) WITH PROPOFOL;  Surgeon: Manya Silvas, MD;  Location: [facility] ENDOSCOPY;  Service: Endoscopy;  Laterality: N/A;  . ESOPHAGOGASTRODUODENOSCOPY (EGD) WITH PROPOFOL N/A 09/18/2019   Procedure: ESOPHAGOGASTRODUODENOSCOPY (EGD) WITH PROPOFOL;  Surgeon: [name], MD;  Location: [facility] ENDOSCOPY;  Service: Gastroenterology;  Laterality: N/A;  . GIVENS CAPSULE STUDY N/A 11/09/2019   Procedure: GIVENS CAPSULE STUDY;  Surgeon: [name], MD;  Location: [facility] ENDOSCOPY;  Service: Gastroenterology;  Laterality: N/A;  . MYOMECTOMY N/A 01/02/2018   Procedure: MYOMECTOMY;  Surgeon: Gae Dry, MD;  Location: [facility] ORS;  Service: Gynecology;  Laterality: N/A;  . TUBAL LIGATION  2012    Prior to Admission medications   Medication Sig Start Date End Date Taking? Authorizing Provider  ABILIFY MAINTENA 400 MG PRSY prefilled syringe Inject into the muscle. 02/23/20   [provider]  albuterol (PROVENTIL HFA;VENTOLIN HFA) 108 (90 Base) MCG/ACT inhaler Inhale 2 puffs into the lungs every 6 (six) hours as needed for wheezing or shortness of breath. 11/29/17   Darel Hong, MD  escitalopram (LEXAPRO) 20 MG tablet Take 20 mg by mouth  every morning.  12/10/14   [provider]  estradiol (ESTRACE) 2 MG tablet Take 1 tablet (2 mg total) by mouth daily for 10 days. 06/27/20 07/07/20  Schuman, Stefanie Libel, MD  eszopiclone (LUNESTA) 2 MG TABS tablet Take 2 mg by mouth at bedtime as needed for sleep. Take immediately before bedtime     [provider]  ferrous sulfate 325 (65 FE) MG  tablet Take 1 tablet (325 mg total) by mouth 2 (two) times daily with a meal. 03/28/19   Gladstone Lighter, MD  pantoprazole (PROTONIX) 40 MG tablet TAKE 1 TABLET BY MOUTH DAILY 04/11/20   [name], MD    Allergies Nsaids, Peanut-containing drug products, Percocet [oxycodone-acetaminophen], and Phenylephrine-guaifenesin  Family History  Problem Relation Age of Onset  . Anxiety disorder Mother   . Asthma Mother   . Fibroids Mother   . GER disease Father     Social History Social History   Tobacco Use  . Smoking status: Current Every Day Smoker    Packs/day: 0.25    Years: 1.00    Pack years: 0.25    Types: Cigarettes  . Smokeless tobacco: Never Used  Vaping Use  . Vaping Use: Never used  Substance Use Topics  . Alcohol use: Not Currently    Comment: socially  . Drug use: Yes    Types: Marijuana    Comment: occasionally     Review of Systems Constitutional: No fever. Eyes: No visual changes. ENT: No sore throat. Cardiovascular: Denies chest pain. Respiratory: Denies shortness of breath. Gastrointestinal: No nausea, vomiting, diarrhea. Genitourinary: Negative for dysuria. Musculoskeletal: Negative for back pain. Skin: Negative for rash. Neurological: Negative for focal weakness or numbness.  ____________________________________________   PHYSICAL EXAM:  VITAL SIGNS: ED Triage Vitals  Enc Vitals Group     BP 10/26/20 0441 (!) 127/91     Pulse Rate 10/26/20 0441 85     Resp 10/26/20 0441 20     Temp 10/26/20 0446 98.6 F (37 C)     Temp Source 10/26/20 0446 Oral     SpO2 10/26/20 0441 100 %     Weight 10/26/20 0434 185 lb (83.9 kg)     Height 10/26/20 0434 5' (1.524 m)     Head Circumference --      Peak Flow --      Pain Score 10/26/20 0434 0     Pain Loc --      Pain Edu? --      Excl. in [place]? --    CONSTITUTIONAL: Alert and oriented and responds appropriately to questions. Well-appearing; well-nourished HEAD: Normocephalic EYES: Conjunctivae  clear, pupils appear equal, EOM appear intact ENT: normal nose; moist mucous membranes NECK: Supple, normal ROM CARD: RRR; S1 and S2 appreciated; no murmurs, no clicks, no rubs, no gallops RESP: Normal chest excursion without splinting or tachypnea; breath sounds clear and equal bilaterally; no wheezes, no rhonchi, no rales, no hypoxia or respiratory distress, speaking full sentences ABD/GI: Normal bowel sounds; non-distended; soft, non-tender, no rebound, no guarding, no peritoneal signs, no hepatosplenomegaly BACK: The back appears normal EXT: Normal ROM in all joints; no deformity noted, no edema; no cyanosis SKIN: Normal color for age and race; warm; no rash on exposed skin NEURO: Moves all extremities equally PSYCH: The patient's mood and manner are appropriate.  Denies SI, HI or hallucinations.  ____________________________________________   LABS (all labs ordered are listed, but only abnormal results are displayed)  Labs Reviewed - No data to display  ____________________________________________  EKG   EKG Interpretation  Date/Time:  Wednesday October 26 2020 04:49:50 EDT Ventricular Rate:  83 PR Interval:  117 QRS Duration: 74 QT Interval:  394 QTC Calculation: 463 R Axis:   37 Text Interpretation: Sinus rhythm Borderline short PR interval Low voltage, precordial leads Borderline T abnormalities, anterior leads Confirmed by Pryor Curia [phone]) on 10/26/2020 4:51:49 AM       ____________________________________________  RADIOLOGY  ____________________________________________   PROCEDURES  Procedure(s) performed (including Critical Care):  Procedures   ____________________________________________   INITIAL IMPRESSION / ASSESSMENT AND PLAN / ED COURSE  As part of my medical decision making, I reviewed the following data within the [place] notes reviewed and incorporated, EKG interpreted , Old EKG reviewed, Notes from prior ED  visits and [place] Controlled Substance Database         Patient here with complaints of anxiety.  No psychiatric safety concerns at this time.  She is requesting something for anxiety now.  It appears she has been seen frequently in the ED for the same and has received multiple recent prescriptions of benzodiazepines (see below).  Last was refilled 7 days ago.  Discussed with patient that I do not feel comfortable continuing to give her benzodiazepines from the ED and that this now needs to be managed by her outpatient psychiatrist which she has an appointment with tomorrow.  She is comfortable with this plan to follow-up with her doctor.  I do not feel she needs emergent psychiatric or TTS evaluation tonight and she agrees.  EKG is nonischemic and shows no arrhythmia, or interval change.  Will give one-time dose of 1 mg oral Ativan now.  Will discharge home with her husband.   10/19/2020  10/18/2020   1  Alprazolam 0.5 Mg Tablet  4.00  2  Je Bau  [id number]  [facility] ([id number])  0/0  2.00 LME  Private Pay  [place]    09/16/2020  09/16/2020   1  Lorazepam 0.5 Mg Tablet  20.00  20  Je Rub  [id number]  Med ([id number])  0/0  0.50 LME  Medicaid  [place]    09/02/2020  09/02/2020   1  Clonazepam 0.5 Mg Tablet  15.00  30  Sh Ahl  [id number]  Med ([id number]         At this time, I do not feel there is any life-threatening condition present. I have reviewed, interpreted and discussed all results (EKG, imaging, lab, urine as appropriate) and exam findings with patient/family. I have reviewed nursing notes and appropriate previous records.  I feel the patient is safe to be discharged home without further emergent workup and can continue workup as an outpatient as needed. Discussed usual and customary return precautions. Patient/family verbalize understanding and are comfortable with this plan.  Outpatient follow-up has been provided as needed. All questions have been answered.   6:00 AM  Pt sleeping comfortably after  Ativan. ____________________________________________   FINAL CLINICAL IMPRESSION(S) / ED DIAGNOSES  Final diagnoses:  Anxiety     ED Discharge Orders    None      *Please note:  Lori Bentley was evaluated in Emergency Department on 10/26/2020 for the symptoms described in the history of present illness. She was evaluated in the context of the global COVID-19 pandemic, which necessitated consideration that the patient might be at risk for infection with the SARS-CoV-2 virus that causes COVID-19. Institutional protocols and algorithms that pertain to the evaluation of patients at  risk for COVID-19 are in a state of rapid change based on information released by regulatory bodies including the CDC and federal and state organizations. These policies and algorithms were followed during the patient's care in the ED.  Some ED evaluations and interventions may be delayed as a result of limited staffing during and the pandemic.*   Note:  This document was prepared using Dragon voice recognition software and may include unintentional dictation errors.   Ernesta Trabert, Delice Bison, DO 10/26/20 [place], Delice Bison, DO 10/26/20 [phone]

## 2020-10-26 NOTE — ED Notes (Signed)
PT is waiting for ride at this time

## 2020-10-26 NOTE — Discharge Instructions (Addendum)
Please follow-up with your psychiatrist as scheduled tomorrow for further medication recommendations.

## 2020-10-26 NOTE — ED Notes (Signed)
Pt states that she has been dealing with increased anxiety for last several weeks. States she has seen her PCP, urgent care and has a [place] counselor. PT reports that today she has experienced increased anxiety with no relief from home meds. Pt states she has hydroxyzine 25mg  at home, went to UC today, they prescribed 50mg  of same, this made her too tired to function, anxiety is still present and she cannot make it until her next appt in AM with counselor. No complaints other than above, states she has used meditation techniques with some success. Pt is sitting on edge of bed now but is not restless at this time.

## 2020-10-26 NOTE — ED Triage Notes (Signed)
Pt here for anxiety states has been here for the same recently. Pt states has apptm with therapist tomorrow, but states is having increased anxiety and not able to wait.

## 2020-10-26 NOTE — ED Triage Notes (Signed)
Patient ambulatory to triage with steady gait, without difficulty or distress noted; pt reports feelings anxious "jittery"; was taking wellbutrin and buspar which were stopped recently; here Mon night but left before being seen; went to urgent care yesterday and given hydroxyzine but cause her to be "too drowsy"

## 2020-10-27 MED ORDER — LORAZEPAM 1 MG PO TABS
1.0000 mg | ORAL_TABLET | Freq: Once | ORAL | Status: AC
  Administered 2020-10-27: 1 mg via ORAL
  Filled 2020-10-27: qty 1

## 2020-10-27 NOTE — ED Notes (Signed)
Hourly rounding completed at this time, patient currently awake in hallway bed. No complaints, stable, and in no acute distress. Q15 minute rounds and monitoring via Rover and Officer to continue. 

## 2020-10-27 NOTE — ED Notes (Signed)
Pt provided with crackers per request

## 2020-10-27 NOTE — ED Notes (Signed)
Pt ride is present in triage and this nurse is notified. Med administered.

## 2020-10-27 NOTE — ED Provider Notes (Signed)
[facility] Emergency Department Provider Note  ____________________________________________  Time seen: Approximately 12:12 AM  I have reviewed the triage vital signs and the nursing notes.   HISTORY  Chief Complaint Anxiety   HPI Lori Bentley is a 35 y.o. female with a history of anxiety, bipolar disorder who presents for evaluation of anxiety.  Patient has a psychiatrist and has been working with her as an outpatient for her anxiety.  She reports that over the last week she has been started on Wellbutrin, BuSpar, and hydroxyzine.  She reports that she does not like the way she feels on these medications.  She reports feeling jittery, very nervous and her anxiety feels worse.  She only took a few days of each and stopped taking them.  She has been on benzos before which have helped but those have been discontinued.  She denies any suicidal homicidal thoughts.  She does have an appointment with her psychiatrist tomorrow.  She reports that she has not been able to sleep over the last 2 to 3 days because of her severe anxiety.  She is requesting a prescription for benzos.   Past Medical History:  Diagnosis Date  . Anxiety   . Asthma    WELL CONTROLLED  . Bipolar affective disorder, currently manic, mild ([place])   . Chronic anemia   . Depression   . Diabetes ([place]) 12/2017   patient denies diabetes  . GERD (gastroesophageal reflux disease)   . History of blood transfusion 2018   GI BLEED  . History of hiatal hernia     Patient Active Problem List   Diagnosis Date Noted  . GIB (gastrointestinal bleeding) 03/27/2019  . GI bleed 03/27/2019  . Fibroids 12/09/2017  . IDA (iron deficiency anemia) 05/16/2016  . New onset of headaches 05/16/2016  . Menorrhagia with regular cycle 05/16/2016  . Dehydration 05/16/2016  . Nausea with vomiting 05/16/2016  . Blood loss anemia 04/23/2016  . Hematemesis 04/23/2016  . Melena 04/23/2016  . Diabetes ([place])  04/23/2016  . GERD (gastroesophageal reflux disease) 04/23/2016    Past Surgical History:  Procedure Laterality Date  . COLONOSCOPY WITH PROPOFOL N/A 09/18/2019   Procedure: COLONOSCOPY WITH PROPOFOL;  Surgeon: [name], MD;  Location: [facility] ENDOSCOPY;  Service: Gastroenterology;  Laterality: N/A;  . ESOPHAGOGASTRODUODENOSCOPY (EGD) WITH PROPOFOL N/A 05/18/2016   Procedure: ESOPHAGOGASTRODUODENOSCOPY (EGD) WITH PROPOFOL;  Surgeon: Manya Silvas, MD;  Location: [facility] ENDOSCOPY;  Service: Endoscopy;  Laterality: N/A;  . ESOPHAGOGASTRODUODENOSCOPY (EGD) WITH PROPOFOL N/A 09/18/2019   Procedure: ESOPHAGOGASTRODUODENOSCOPY (EGD) WITH PROPOFOL;  Surgeon: [name], MD;  Location: [facility] ENDOSCOPY;  Service: Gastroenterology;  Laterality: N/A;  . GIVENS CAPSULE STUDY N/A 11/09/2019   Procedure: GIVENS CAPSULE STUDY;  Surgeon: [name], MD;  Location: [facility] ENDOSCOPY;  Service: Gastroenterology;  Laterality: N/A;  . MYOMECTOMY N/A 01/02/2018   Procedure: MYOMECTOMY;  Surgeon: Gae Dry, MD;  Location: [facility] ORS;  Service: Gynecology;  Laterality: N/A;  . TUBAL LIGATION  2012    Prior to Admission medications   Medication Sig Start Date End Date Taking? Authorizing Provider  ABILIFY MAINTENA 400 MG PRSY prefilled syringe Inject into the muscle. 02/23/20   [provider]  albuterol (PROVENTIL HFA;VENTOLIN HFA) 108 (90 Base) MCG/ACT inhaler Inhale 2 puffs into the lungs every 6 (six) hours as needed for wheezing or shortness of breath. 11/29/17   Darel Hong, MD  escitalopram (LEXAPRO) 20 MG tablet Take 20 mg by mouth every morning.  12/10/14  [provider]  estradiol (ESTRACE) 2 MG tablet Take 1 tablet (2 mg total) by mouth daily for 10 days. 06/27/20 07/07/20  Schuman, Stefanie Libel, MD  eszopiclone (LUNESTA) 2 MG TABS tablet Take 2 mg by mouth at bedtime as needed for sleep. Take immediately before bedtime     [provider]  ferrous sulfate 325 (65 FE) MG  tablet Take 1 tablet (325 mg total) by mouth 2 (two) times daily with a meal. 03/28/19   Gladstone Lighter, MD  pantoprazole (PROTONIX) 40 MG tablet TAKE 1 TABLET BY MOUTH DAILY 04/11/20   [name], MD    Allergies Nsaids, Peanut-containing drug products, Percocet [oxycodone-acetaminophen], and Phenylephrine-guaifenesin  Family History  Problem Relation Age of Onset  . Anxiety disorder Mother   . Asthma Mother   . Fibroids Mother   . GER disease Father     Social History Social History   Tobacco Use  . Smoking status: Current Every Day Smoker    Packs/day: 0.25    Years: 1.00    Pack years: 0.25    Types: Cigarettes  . Smokeless tobacco: Never Used  Vaping Use  . Vaping Use: Never used  Substance Use Topics  . Alcohol use: Not Currently    Comment: socially  . Drug use: Yes    Types: Marijuana    Comment: occasionally     Review of Systems  Constitutional: Negative for fever. Eyes: Negative for visual changes. ENT: Negative for sore throat. Neck: No neck pain  Cardiovascular: Negative for chest pain. Respiratory: Negative for shortness of breath. Gastrointestinal: Negative for abdominal pain, vomiting or diarrhea. Genitourinary: Negative for dysuria. Musculoskeletal: Negative for back pain. Skin: Negative for rash. Neurological: Negative for headaches, weakness or numbness. Psych: No SI or HI. + anxiety  ____________________________________________   PHYSICAL EXAM:  VITAL SIGNS: ED Triage Vitals [10/26/20 2100]  Enc Vitals Group     BP 114/90     Pulse Rate 93     Resp 20     Temp 98.8 F (37.1 C)     Temp Source Oral     SpO2 99 %     Weight 185 lb (83.9 kg)     Height 5' (1.524 m)     Head Circumference      Peak Flow      Pain Score 0     Pain Loc      Pain Edu?      Excl. in [place]?     Constitutional: Alert and oriented. Well appearing and in no apparent distress. HEENT:      Head: Normocephalic and atraumatic.         Eyes:  Conjunctivae are normal. Sclera is non-icteric.       Mouth/Throat: Mucous membranes are moist.       Neck: Supple with no signs of meningismus. Cardiovascular: Regular rate and rhythm.  Respiratory: Normal respiratory effort.  Gastrointestinal: Soft, non tender, and non distended. Musculoskeletal: No edema, cyanosis, or erythema of extremities. Neurologic: Normal speech and language. Face is symmetric. Moving all extremities. No gross focal neurologic deficits are appreciated. Skin: Skin is warm, dry and intact. No rash noted. Psychiatric: Mood and affect are normal. Speech and behavior are normal.  ____________________________________________   LABS (all labs ordered are listed, but only abnormal results are displayed)  Labs Reviewed - No data to display ____________________________________________  EKG  none  ____________________________________________  RADIOLOGY  none  ____________________________________________   PROCEDURES  Procedure(s) performed: None Procedures  Critical Care performed:  None ____________________________________________   INITIAL IMPRESSION / ASSESSMENT AND PLAN / ED COURSE  35 y.o. female with a history of anxiety, bipolar disorder who presents for evaluation of anxiety.  Patient seen here yesterday for the same requesting benzodiazepines.  Has been started on 3 different medications over the last week but does report that she does not feel how the medications make her feel, therefore she is not taking any of them.  Yesterday she was given a dose of Ativan in the emergency room but no prescriptions were given.  Review of [place] controlled substance database shows that patient has had 3 benzodiazepine prescriptions filled in the last 1.5 months.  I did explain to the patient that we are not going to prescribe her benzos.  She has a psychiatrist who needs to prescribe her medications for her anxiety.  Patient is teary requesting 1 dose for tonight so she  is able to sleep.  Patient is not driving and is here with her husband therefore I told her I would give her 1 mg of Ativan so she can sleep tonight until she is able to see her psychiatrist in the morning.  There is no indication for IVC.  I explained to the patient that we will no longer provide her any benzos in this emergency department and that she needs to see her psychiatrist for them      Please note:  Patient was evaluated in Emergency Department today for the symptoms described in the history of present illness. Patient was evaluated in the context of the global COVID-19 pandemic, which necessitated consideration that the patient might be at risk for infection with the SARS-CoV-2 virus that causes COVID-19. Institutional protocols and algorithms that pertain to the evaluation of patients at risk for COVID-19 are in a state of rapid change based on information released by regulatory bodies including the CDC and federal and state organizations. These policies and algorithms were followed during the patient's care in the ED.  Some ED evaluations and interventions may be delayed as a result of limited staffing during the pandemic.   ____________________________________________   FINAL CLINICAL IMPRESSION(S) / ED DIAGNOSES   Final diagnoses:  Anxiety      NEW MEDICATIONS STARTED DURING THIS VISIT:  ED Discharge Orders    None       Note:  This document was prepared using Dragon voice recognition software and may include unintentional dictation errors.    Alfred Levins, [place], MD 10/27/20 [phone]

## 2020-10-27 NOTE — Discharge Instructions (Addendum)
You have been seen in the Emergency Department (ED)  today for a psychiatric complaint.    Please return to the Emergency Department (ED)  immediately if you have ANY thoughts of hurting yourself or anyone else, so that we may help you.  Please avoid alcohol and drug use.  Follow up with your doctor and/or therapist as soon as possible regarding today's ED  visit.   You may call crisis hotline for [facility] at [phone].

## 2020-10-27 NOTE — ED Notes (Signed)
Dr. Alfred Levins states not to administer pt ativan until she has a ride here and it has been seen on site. Pt has already been made aware that ride is needed to be here and called by this nurse and Dr. Alfred Levins. Pt is provided with instruction for ride when ride arrives

## 2020-10-27 NOTE — ED Notes (Signed)
Pt states no vitals to be obtained prior to DC.

## 2020-10-31 ENCOUNTER — Inpatient Hospital Stay (HOSPITAL_BASED_OUTPATIENT_CLINIC_OR_DEPARTMENT_OTHER): Payer: Medicaid Other | Admitting: Oncology

## 2020-10-31 ENCOUNTER — Encounter: Payer: Self-pay | Admitting: Oncology

## 2020-10-31 ENCOUNTER — Inpatient Hospital Stay: Payer: Medicaid Other

## 2020-10-31 ENCOUNTER — Inpatient Hospital Stay: Payer: Medicaid Other | Attending: Oncology

## 2020-10-31 DIAGNOSIS — D509 Iron deficiency anemia, unspecified: Secondary | ICD-10-CM | POA: Diagnosis not present

## 2020-10-31 DIAGNOSIS — Z807 Family history of other malignant neoplasms of lymphoid, hematopoietic and related tissues: Secondary | ICD-10-CM | POA: Diagnosis not present

## 2020-10-31 DIAGNOSIS — D500 Iron deficiency anemia secondary to blood loss (chronic): Secondary | ICD-10-CM | POA: Diagnosis not present

## 2020-10-31 DIAGNOSIS — Z87891 Personal history of nicotine dependence: Secondary | ICD-10-CM | POA: Diagnosis not present

## 2020-10-31 LAB — CBC WITH DIFFERENTIAL/PLATELET
Abs Immature Granulocytes: 0.02 10*3/uL (ref 0.00–0.07)
Basophils Absolute: 0 10*3/uL (ref 0.0–0.1)
Basophils Relative: 0 %
Eosinophils Absolute: 0.4 10*3/uL (ref 0.0–0.5)
Eosinophils Relative: 4 %
HCT: 37.5 % (ref 36.0–46.0)
Hemoglobin: 12.4 g/dL (ref 12.0–15.0)
Immature Granulocytes: 0 %
Lymphocytes Relative: 22 %
Lymphs Abs: 2.1 10*3/uL (ref 0.7–4.0)
MCH: 27.9 pg (ref 26.0–34.0)
MCHC: 33.1 g/dL (ref 30.0–36.0)
MCV: 84.5 fL (ref 80.0–100.0)
Monocytes Absolute: 0.5 10*3/uL (ref 0.1–1.0)
Monocytes Relative: 6 %
Neutro Abs: 6.4 10*3/uL (ref 1.7–7.7)
Neutrophils Relative %: 68 %
Platelets: 409 10*3/uL — ABNORMAL HIGH (ref 150–400)
RBC: 4.44 MIL/uL (ref 3.87–5.11)
RDW: 12.9 % (ref 11.5–15.5)
WBC: 9.5 10*3/uL (ref 4.0–10.5)
nRBC: 0 % (ref 0.0–0.2)

## 2020-10-31 LAB — IRON AND TIBC
Iron: 27 ug/dL — ABNORMAL LOW (ref 28–170)
Saturation Ratios: 7 % — ABNORMAL LOW (ref 10.4–31.8)
TIBC: 407 ug/dL (ref 250–450)
UIBC: 380 ug/dL

## 2020-10-31 LAB — FERRITIN: Ferritin: 8 ng/mL — ABNORMAL LOW (ref 11–307)

## 2020-10-31 NOTE — Progress Notes (Signed)
Called Lori Bentley and gave her option that hgb normal but ferritin is low. Options are cont. Taking 1 iron pill a day, or increase to 1 iron pill twice a day or ger IV iron tx. -Lori Bentley wants the iron treatments . I told her that I need to send message to [place] and then have scheduler with dates and times. She is agreeable to plan

## 2020-10-31 NOTE — Progress Notes (Signed)
   Hematology/Oncology Consult note [facility]  [phone] [phone]  Patient Care Team: Rubio, Jessica, NP as PCP - General (Nurse Practitioner) Rao, Archana C, MD as Consulting Physician (Oncology)   Name of the patient: Lori Bentley  [id number]  12/07/1985   Date of visit: 10/31/20  Diagnosis-iron deficiency anemia  Chief complaint/ Reason for visit-routine follow-up of iron deficiency anemia  Heme/Onc history:Patient is a 35-year-old female with iron deficiency anemia that has been attributed to menorrhagia in the past. She has received IV iron in the past But was then lost to follow-up in 2018. She was recently admitted to the hospital for significant anemia with a hemoglobin of 7.8. She was also seen by GI and is scheduled to undergo endoscopy and colonoscopy.EGD showed large hiatal hernia. Colonoscopy was normal. She has had surgery for her fibroids in 2019 and reports that since then her menstrual cycles are not as heavy as before but she still has first 1 or 2 days when her menstrual cycles would be heavy  Interval history-patient states that her menstrual cycles are better after Mirena insertion.  She reports ongoing symptoms of anxiety.  Her mother was recently diagnosed with multiple myeloma and is also under my care.  ECOG PS- 1 Pain scale- 0   Review of systems- Review of Systems  Constitutional: Positive for malaise/fatigue. Negative for chills, fever and weight loss.  HENT: Negative for congestion, ear discharge and nosebleeds.   Eyes: Negative for blurred vision.  Respiratory: Negative for cough, hemoptysis, sputum production, shortness of breath and wheezing.   Cardiovascular: Negative for chest pain, palpitations, orthopnea and claudication.  Gastrointestinal: Negative for abdominal pain, blood in stool, constipation, diarrhea, heartburn, melena, nausea and vomiting.  Genitourinary: Negative for dysuria,  flank pain, frequency, hematuria and urgency.  Musculoskeletal: Negative for back pain, joint pain and myalgias.  Skin: Negative for rash.  Neurological: Negative for dizziness, tingling, focal weakness, seizures, weakness and headaches.  Endo/Heme/Allergies: Does not bruise/bleed easily.  Psychiatric/Behavioral: Negative for depression and suicidal ideas. The patient is nervous/anxious. The patient does not have insomnia.       Allergies  Allergen Reactions  . Nsaids     GI BLEED-HAD BLOOD TRASNFUSION  . Peanut-Containing Drug Products Itching    Itching in throat  . Percocet [Oxycodone-Acetaminophen] Swelling    Swelling of face and throat  . Phenylephrine-Guaifenesin Other (See Comments)    Unknown reaction. Does not remember reaction     Past Medical History:  Diagnosis Date  . Anxiety   . Asthma    WELL CONTROLLED  . Bipolar affective disorder, currently manic, mild ([facility])   . Chronic anemia   . Depression   . Diabetes ([facility]) 12/2017   patient denies diabetes  . GERD (gastroesophageal reflux disease)   . History of blood transfusion 2018   GI BLEED  . History of hiatal hernia      Past Surgical History:  Procedure Laterality Date  . COLONOSCOPY WITH PROPOFOL N/A 09/18/2019   Procedure: COLONOSCOPY WITH PROPOFOL;  Surgeon: Anna, Kiran, MD;  Location: [facility] ENDOSCOPY;  Service: Gastroenterology;  Laterality: N/A;  . ESOPHAGOGASTRODUODENOSCOPY (EGD) WITH PROPOFOL N/A 05/18/2016   Procedure: ESOPHAGOGASTRODUODENOSCOPY (EGD) WITH PROPOFOL;  Surgeon: Robert T Elliott, MD;  Location: [facility] ENDOSCOPY;  Service: Endoscopy;  Laterality: N/A;  . ESOPHAGOGASTRODUODENOSCOPY (EGD) WITH PROPOFOL N/A 09/18/2019   Procedure: ESOPHAGOGASTRODUODENOSCOPY (EGD) WITH PROPOFOL;  Surgeon: Anna, Kiran, MD;  Location: [facility] ENDOSCOPY;  Service: Gastroenterology;  Laterality: N/A;  .   GIVENS CAPSULE STUDY N/A 11/09/2019   Procedure: GIVENS CAPSULE STUDY;  Surgeon: [name], MD;  Location: [facility]  ENDOSCOPY;  Service: Gastroenterology;  Laterality: N/A;  . MYOMECTOMY N/A 01/02/2018   Procedure: MYOMECTOMY;  Surgeon: Gae Dry, MD;  Location: [facility] ORS;  Service: Gynecology;  Laterality: N/A;  . TUBAL LIGATION  2012    Social History   Socioeconomic History  . Marital status: Married    Spouse name: Not on file  . Number of children: Not on file  . Years of education: Not on file  . Highest education level: Not on file  Occupational History  . Not on file  Tobacco Use  . Smoking status: Former Smoker    Packs/day: 0.25    Years: 1.00    Pack years: 0.25    Types: Cigarettes    Quit date: 10/24/2020    Years since quitting: 0.0  . Smokeless tobacco: Never Used  Vaping Use  . Vaping Use: Never used  Substance and Sexual Activity  . Alcohol use: Not Currently  . Drug use: Yes    Types: Marijuana    Comment: occasionally   . Sexual activity: Yes    Birth control/protection: I.U.D.    Comment: mirena  Other Topics Concern  . Not on file  Social History Narrative  . Not on file   Social Determinants of Health   Financial Resource Strain: Not on file  Food Insecurity: Not on file  Transportation Needs: Not on file  Physical Activity: Not on file  Stress: Not on file  Social Connections: Not on file  Intimate Partner Violence: Not on file    Family History  Problem Relation Age of Onset  . Anxiety disorder Mother   . Asthma Mother   . Fibroids Mother   . GER disease Father      Current Outpatient Medications:  .  ABILIFY MAINTENA 400 MG PRSY prefilled syringe, Inject into the muscle., Disp: , Rfl:  .  escitalopram (LEXAPRO) 20 MG tablet, Take 20 mg by mouth every morning. , Disp: , Rfl:  .  ferrous sulfate 325 (65 FE) MG tablet, Take 1 tablet (325 mg total) by mouth 2 (two) times daily with a meal. (Patient taking differently: Take 325 mg by mouth daily.), Disp: 60 tablet, Rfl: 3 .  pantoprazole (PROTONIX) 40 MG tablet, TAKE 1 TABLET BY MOUTH DAILY,  Disp: 90 tablet, Rfl: 1 .  albuterol (PROVENTIL HFA;VENTOLIN HFA) 108 (90 Base) MCG/ACT inhaler, Inhale 2 puffs into the lungs every 6 (six) hours as needed for wheezing or shortness of breath., Disp: 1 Inhaler, Rfl: 0  Physical exam:  Physical Exam Constitutional:      General: She is not in acute distress. Cardiovascular:     Heart sounds: Normal heart sounds.  Pulmonary:     Effort: Pulmonary effort is normal.     Breath sounds: Normal breath sounds.  Skin:    General: Skin is warm and dry.  Neurological:     Mental Status: She is alert and oriented to person, place, and time.      CMP Latest Ref Rng & Units 08/02/2020  Glucose 70 - 99 mg/dL 94  BUN 6 - 20 mg/dL 7  Creatinine 0.44 - 1.00 mg/dL 0.86  Sodium 135 - 145 mmol/L 138  Potassium 3.5 - 5.1 mmol/L 3.6  Chloride 98 - 111 mmol/L 105  CO2 22 - 32 mmol/L 26  Calcium 8.9 - 10.3 mg/dL 8.7(L)  Total Protein 6.5 -  8.1 g/dL -  Total Bilirubin 0.3 - 1.2 mg/dL -  Alkaline Phos 38 - 126 U/L -  AST 15 - 41 U/L -  ALT 14 - 54 U/L -   CBC Latest Ref Rng & Units 10/31/2020  WBC 4.0 - 10.5 K/uL 9.5  Hemoglobin 12.0 - 15.0 g/dL 12.4  Hematocrit 36.0 - 46.0 % 37.5  Platelets 150 - 400 K/uL 409(H)     Assessment and plan- Patient is a 35 y.o. female with iron deficiency anemia here for routine follow-up  Patient is not presently anemic today.  However her iron studies came backLow with a ferritin level of 8 and low iron saturation of 7%.  She last received IV iron back in August 2021.  We will reach out to the patient to see if she would like to come back for IV iron or increase her oral iron to twice a day.  Her menstrual cycles have gotten lighter after Mirena insertion.  Repeat CBC ferritin and iron studies in 6 months and I will see her back thereafter   Visit Diagnosis 1. Iron deficiency anemia due to chronic blood loss   2. Iron deficiency anemia, unspecified iron deficiency anemia type      Dr. Randa Evens, MD,  [facility] [phone] 10/31/2020 3:34 PM

## 2020-10-31 NOTE — Progress Notes (Signed)
Pt says from anemia she feels better but with her mom in the hospital with multiple myeloma. Pt states her anxiety is worse. 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 Eating and drinking ok, BM qod. No nausea or vomiting. Anemia - she feels good about this

## 2020-11-07 ENCOUNTER — Other Ambulatory Visit: Payer: Self-pay | Admitting: Oncology

## 2020-11-07 ENCOUNTER — Inpatient Hospital Stay: Payer: Medicaid Other

## 2020-11-07 VITALS — BP 110/78 | HR 80 | Temp 98.3°F | Resp 18

## 2020-11-07 DIAGNOSIS — D509 Iron deficiency anemia, unspecified: Secondary | ICD-10-CM | POA: Diagnosis not present

## 2020-11-07 DIAGNOSIS — D500 Iron deficiency anemia secondary to blood loss (chronic): Secondary | ICD-10-CM

## 2020-11-07 MED ORDER — SODIUM CHLORIDE 0.9 % IV SOLN
510.0000 mg | Freq: Once | INTRAVENOUS | Status: AC
  Administered 2020-11-07: 510 mg via INTRAVENOUS
  Filled 2020-11-07: qty 510

## 2020-11-07 MED ORDER — SODIUM CHLORIDE 0.9 % IV SOLN
Freq: Once | INTRAVENOUS | Status: AC
  Filled 2020-11-07: qty 250

## 2020-11-14 ENCOUNTER — Inpatient Hospital Stay: Payer: Medicaid Other

## 2020-11-14 ENCOUNTER — Other Ambulatory Visit: Payer: Self-pay

## 2020-11-14 VITALS — BP 104/74 | HR 73 | Temp 98.0°F | Resp 18

## 2020-11-14 DIAGNOSIS — D500 Iron deficiency anemia secondary to blood loss (chronic): Secondary | ICD-10-CM

## 2020-11-14 DIAGNOSIS — D509 Iron deficiency anemia, unspecified: Secondary | ICD-10-CM | POA: Diagnosis not present

## 2020-11-14 MED ORDER — SODIUM CHLORIDE 0.9 % IV SOLN
Freq: Once | INTRAVENOUS | Status: AC
  Filled 2020-11-14: qty 250

## 2020-11-14 MED ORDER — SODIUM CHLORIDE 0.9 % IV SOLN
510.0000 mg | Freq: Once | INTRAVENOUS | Status: AC
  Administered 2020-11-14: 510 mg via INTRAVENOUS
  Filled 2020-11-14: qty 510

## 2021-01-11 ENCOUNTER — Other Ambulatory Visit: Payer: Self-pay | Admitting: Gastroenterology

## 2021-02-06 ENCOUNTER — Other Ambulatory Visit: Payer: Self-pay

## 2021-02-06 ENCOUNTER — Emergency Department: Payer: Medicaid Other

## 2021-02-06 ENCOUNTER — Emergency Department
Admission: EM | Admit: 2021-02-06 | Discharge: 2021-02-06 | Disposition: A | Discharge status: Home / Self Care | Payer: Medicaid Other | Attending: Student in an Organized Health Care Education/Training Program | Admitting: Student in an Organized Health Care Education/Training Program

## 2021-02-06 DIAGNOSIS — R112 Nausea with vomiting, unspecified: Secondary | ICD-10-CM | POA: Insufficient documentation

## 2021-02-06 DIAGNOSIS — N83201 Unspecified ovarian cyst, right side: Secondary | ICD-10-CM

## 2021-02-06 DIAGNOSIS — Z91010 Allergy to peanuts: Secondary | ICD-10-CM | POA: Diagnosis not present

## 2021-02-06 DIAGNOSIS — J45909 Unspecified asthma, uncomplicated: Secondary | ICD-10-CM | POA: Diagnosis not present

## 2021-02-06 DIAGNOSIS — U071 COVID-19: Secondary | ICD-10-CM | POA: Diagnosis not present

## 2021-02-06 DIAGNOSIS — Z87891 Personal history of nicotine dependence: Secondary | ICD-10-CM | POA: Insufficient documentation

## 2021-02-06 DIAGNOSIS — N83291 Other ovarian cyst, right side: Secondary | ICD-10-CM | POA: Insufficient documentation

## 2021-02-06 DIAGNOSIS — E119 Type 2 diabetes mellitus without complications: Secondary | ICD-10-CM | POA: Diagnosis not present

## 2021-02-06 DIAGNOSIS — R109 Unspecified abdominal pain: Secondary | ICD-10-CM

## 2021-02-06 LAB — TYPE AND SCREEN
ABO/RH(D): B NEG
Antibody Screen: NEGATIVE

## 2021-02-06 LAB — CBC
HCT: 31.2 % — ABNORMAL LOW (ref 36.0–46.0)
Hemoglobin: 10 g/dL — ABNORMAL LOW (ref 12.0–15.0)
MCH: 26.6 pg (ref 26.0–34.0)
MCHC: 32.1 g/dL (ref 30.0–36.0)
MCV: 83 fL (ref 80.0–100.0)
Platelets: 466 10*3/uL — ABNORMAL HIGH (ref 150–400)
RBC: 3.76 MIL/uL — ABNORMAL LOW (ref 3.87–5.11)
RDW: 14.1 % (ref 11.5–15.5)
WBC: 17.4 10*3/uL — ABNORMAL HIGH (ref 4.0–10.5)
nRBC: 0 % (ref 0.0–0.2)

## 2021-02-06 LAB — RESP PANEL BY RT-PCR (FLU A&B, COVID) ARPGX2
Influenza A by PCR: NEGATIVE
Influenza B by PCR: NEGATIVE
SARS Coronavirus 2 by RT PCR: POSITIVE — AB

## 2021-02-06 LAB — COMPREHENSIVE METABOLIC PANEL
ALT: 23 U/L (ref 0–44)
AST: 30 U/L (ref 15–41)
Albumin: 3.8 g/dL (ref 3.5–5.0)
Alkaline Phosphatase: 63 U/L (ref 38–126)
Anion gap: 6 (ref 5–15)
BUN: 9 mg/dL (ref 6–20)
CO2: 25 mmol/L (ref 22–32)
Calcium: 8.1 mg/dL — ABNORMAL LOW (ref 8.9–10.3)
Chloride: 106 mmol/L (ref 98–111)
Creatinine, Ser: 0.81 mg/dL (ref 0.44–1.00)
GFR, Estimated: >60 mL/min (ref >60)
Glucose, Bld: 103 mg/dL — ABNORMAL HIGH (ref 70–99)
Potassium: 3.4 mmol/L — ABNORMAL LOW (ref 3.5–5.1)
Sodium: 137 mmol/L (ref 135–145)
Total Bilirubin: 0.3 mg/dL (ref 0.3–1.2)
Total Protein: 7.5 g/dL (ref 6.5–8.1)

## 2021-02-06 LAB — POC URINE PREG, ED: Preg Test, Ur: NEGATIVE

## 2021-02-06 MED ORDER — LIDOCAINE VISCOUS HCL 2 % MT SOLN
15.0000 mL | Freq: Once | OROMUCOSAL | Status: AC
  Administered 2021-02-06: 15 mL via ORAL
  Filled 2021-02-06: qty 15

## 2021-02-06 MED ORDER — PANTOPRAZOLE SODIUM 40 MG PO TBEC: 40.0000 mg | DELAYED_RELEASE_TABLET | Freq: Every day | ORAL | 1 refills | Status: DC

## 2021-02-06 MED ORDER — PANTOPRAZOLE SODIUM 40 MG IV SOLR
40.0000 mg | Freq: Once | INTRAVENOUS | Status: AC
  Administered 2021-02-06: 40 mg via INTRAVENOUS
  Filled 2021-02-06: qty 40

## 2021-02-06 MED ORDER — ONDANSETRON HCL 4 MG PO TABS: 4.0000 mg | ORAL_TABLET | Freq: Every day | ORAL | 0 refills | Status: DC | PRN

## 2021-02-06 MED ORDER — FERROUS SULFATE 325 (65 FE) MG PO TABS: 325.0000 mg | ORAL_TABLET | Freq: Every day | ORAL | 0 refills | Status: AC

## 2021-02-06 MED ORDER — IOHEXOL 350 MG/ML SOLN
100.0000 mL | Freq: Once | INTRAVENOUS | Status: AC | PRN
  Administered 2021-02-06: 100 mL via INTRAVENOUS
  Filled 2021-02-06: qty 100

## 2021-02-06 MED ORDER — ALUM & MAG HYDROXIDE-SIMETH 200-200-20 MG/5ML PO SUSP
30.0000 mL | Freq: Once | ORAL | Status: AC
  Administered 2021-02-06: 30 mL via ORAL
  Filled 2021-02-06: qty 30

## 2021-02-06 NOTE — Discharge Instructions (Addendum)
  IMPRESSION: Large 6.5 cm mildly septated right adnexal cyst. Recommend follow-up US in 3-6 months. Note: This recommendation does not apply to premenarchal patients or to those with increased risk (genetic, family history, elevated tumor markers or other high-risk factors) of ovarian cancer. Reference: Radiology 2019 Nov; 293(2):359-371.   IUD is noted in satisfactory position.   Mild heterogeneity of the uterus without discrete fibroid.

## 2021-02-06 NOTE — ED Notes (Signed)
Blood work collected by staff during pt's triage.

## 2021-02-06 NOTE — ED Notes (Addendum)
Pt reports gets iron infusions; states it has been a few months since her last one; c/o abdominal pain all over and nausea; states has felt weak as of last week; denies dizziness, chills, and SOB. Pt tender all across abdomen. Pt given urine cup as is up to restroom. Skin dry; resp reg/unlabored; steady; pt calm.

## 2021-02-06 NOTE — ED Provider Notes (Signed)
[facility] Emergency Department Provider Note    Event Date/Time   First MD Initiated Contact with Patient 02/06/21 1522     (approximate)  I have reviewed the triage vital signs and the nursing notes.   HISTORY  Chief Complaint GI Bleeding    HPI Lori Bentley is a 35 y.o. female with the below listed past medical history presents to the ER for evaluation of 2 episodes of nausea vomiting and episode of dark tarry stools yesterday.  States that she did have some coffee-ground emesis.  Has been worked up for GIB and Pap past.  No history of ulcers.  States that she has diffuse abdominal pain.  Does not drink alcohol.  Not on any blood thinners.   Past Medical History:  Diagnosis Date   Anxiety    Asthma    WELL CONTROLLED   Bipolar affective disorder, currently manic, mild (HCC)    Chronic anemia    Depression    Diabetes ([place]) 12/2017   patient denies diabetes   GERD (gastroesophageal reflux disease)    History of blood transfusion 2018   GI BLEED   History of hiatal hernia    Family History  Problem Relation Age of Onset   Anxiety disorder Mother    Asthma Mother    Fibroids Mother    GER disease Father    Past Surgical History:  Procedure Laterality Date   COLONOSCOPY WITH PROPOFOL N/A 09/18/2019   Procedure: COLONOSCOPY WITH PROPOFOL;  Surgeon: [name], MD;  Location: [facility] ENDOSCOPY;  Service: Gastroenterology;  Laterality: N/A;   ESOPHAGOGASTRODUODENOSCOPY (EGD) WITH PROPOFOL N/A 05/18/2016   Procedure: ESOPHAGOGASTRODUODENOSCOPY (EGD) WITH PROPOFOL;  Surgeon: Manya Silvas, MD;  Location: [facility] ENDOSCOPY;  Service: Endoscopy;  Laterality: N/A;   ESOPHAGOGASTRODUODENOSCOPY (EGD) WITH PROPOFOL N/A 09/18/2019   Procedure: ESOPHAGOGASTRODUODENOSCOPY (EGD) WITH PROPOFOL;  Surgeon: [name], MD;  Location: [facility] ENDOSCOPY;  Service: Gastroenterology;  Laterality: N/A;   GIVENS CAPSULE STUDY N/A 11/09/2019   Procedure:  GIVENS CAPSULE STUDY;  Surgeon: [name], MD;  Location: [facility] ENDOSCOPY;  Service: Gastroenterology;  Laterality: N/A;   MYOMECTOMY N/A 01/02/2018   Procedure: MYOMECTOMY;  Surgeon: Gae Dry, MD;  Location: [facility] ORS;  Service: Gynecology;  Laterality: N/A;   TUBAL LIGATION  2012   Patient Active Problem List   Diagnosis Date Noted   GIB (gastrointestinal bleeding) 03/27/2019   GI bleed 03/27/2019   Fibroids 12/09/2017   IDA (iron deficiency anemia) 05/16/2016   New onset of headaches 05/16/2016   Menorrhagia with regular cycle 05/16/2016   Dehydration 05/16/2016   Nausea with vomiting 05/16/2016   Blood loss anemia 04/23/2016   Hematemesis 04/23/2016   Melena 04/23/2016   Diabetes ([place]) 04/23/2016   GERD (gastroesophageal reflux disease) 04/23/2016      Prior to Admission medications   Medication Sig Start Date End Date Taking? Authorizing Provider  ondansetron (ZOFRAN) 4 MG tablet Take 1 tablet (4 mg total) by mouth daily as needed for nausea or vomiting. 02/06/21 02/06/22 Yes Merlyn Lot, MD  pantoprazole (PROTONIX) 40 MG tablet Take 1 tablet (40 mg total) by mouth daily. 02/06/21 02/06/22 Yes Merlyn Lot, MD  ABILIFY MAINTENA 400 MG PRSY prefilled syringe Inject into the muscle. 02/23/20   [provider]  albuterol (PROVENTIL HFA;VENTOLIN HFA) 108 (90 Base) MCG/ACT inhaler Inhale 2 puffs into the lungs every 6 (six) hours as needed for wheezing or shortness of breath. 11/29/17   Darel Hong, MD  escitalopram [place])  20 MG tablet Take 20 mg by mouth every morning.  12/10/14   [provider]  ferrous sulfate 325 (65 FE) MG tablet Take 1 tablet (325 mg total) by mouth daily. 02/06/21 05/07/21  Merlyn Lot, MD  pantoprazole ([place]) 40 MG tablet TAKE 1 TABLET BY MOUTH DAILY 04/11/20   [name], MD    Allergies Nsaids, Peanut-containing drug products, Percocet [oxycodone-acetaminophen], and Phenylephrine-guaifenesin    Social  History Social History   Tobacco Use   Smoking status: Former    Packs/day: 0.25    Years: 1.00    Pack years: 0.25    Types: Cigarettes    Quit date: 10/24/2020    Years since quitting: 0.2   Smokeless tobacco: Never  Vaping Use   Vaping Use: Never used  Substance Use Topics   Alcohol use: Not Currently   Drug use: Yes    Types: Marijuana    Comment: occasionally     Review of Systems Patient denies headaches, rhinorrhea, blurry vision, numbness, shortness of breath, chest pain, edema, cough, abdominal pain, nausea, vomiting, diarrhea, dysuria, fevers, rashes or hallucinations unless otherwise stated above in HPI. ____________________________________________   PHYSICAL EXAM:  VITAL SIGNS: Vitals:   02/06/21 1526 02/06/21 2006  BP: 120/80 121/77  Pulse: 86 85  Resp: 18 17  Temp: 98.6 F (37 C)   SpO2: 99% 98%    Constitutional: Alert and oriented.  Eyes: Conjunctivae are normal.  Head: Atraumatic. Nose: No congestion/rhinnorhea. Mouth/Throat: Mucous membranes are moist.   Neck: No stridor. Painless ROM.  Cardiovascular: Normal rate, regular rhythm. Grossly normal heart sounds.  Good peripheral circulation. Respiratory: Normal respiratory effort.  No retractions. Lungs CTAB. Gastrointestinal: Soft and nontender. No distention. No abdominal bruits. No CVA tenderness. Genitourinary: soft brown stool, no blood or melena Musculoskeletal: No lower extremity tenderness nor edema.  No joint effusions. Neurologic:  Normal speech and language. No gross focal neurologic deficits are appreciated. No facial droop Skin:  Skin is warm, dry and intact. No rash noted. Psychiatric: Mood and affect are normal. Speech and behavior are normal.  ____________________________________________   LABS (all labs ordered are listed, but only abnormal results are displayed)  Results for orders placed or performed during the hospital encounter of 02/06/21 (from the past 24 hour(s))   Comprehensive metabolic panel     Status: Abnormal   Collection Time: 02/06/21  2:51 PM  Result Value Ref Range   Sodium 137 135 - 145 mmol/L   Potassium 3.4 (L) 3.5 - 5.1 mmol/L   Chloride 106 98 - 111 mmol/L   CO2 25 22 - 32 mmol/L   Glucose, Bld 103 (H) 70 - 99 mg/dL   BUN 9 6 - 20 mg/dL   Creatinine, Ser 0.81 0.44 - 1.00 mg/dL   Calcium 8.1 (L) 8.9 - 10.3 mg/dL   Total Protein 7.5 6.5 - 8.1 g/dL   Albumin 3.8 3.5 - 5.0 g/dL   AST 30 15 - 41 U/L   ALT 23 0 - 44 U/L   Alkaline Phosphatase 63 38 - 126 U/L   Total Bilirubin 0.3 0.3 - 1.2 mg/dL   GFR, Estimated >60 >60 mL/min   Anion gap 6 5 - 15  CBC     Status: Abnormal   Collection Time: 02/06/21  2:51 PM  Result Value Ref Range   WBC 17.4 (H) 4.0 - 10.5 K/uL   RBC 3.76 (L) 3.87 - 5.11 MIL/uL   Hemoglobin 10.0 (L) 12.0 - 15.0 g/dL  HCT 31.2 (L) 36.0 - 46.0 %   MCV 83.0 80.0 - 100.0 fL   MCH 26.6 26.0 - 34.0 pg   MCHC 32.1 30.0 - 36.0 g/dL   RDW 14.1 11.5 - 15.5 %   Platelets 466 (H) 150 - 400 K/uL   nRBC 0.0 0.0 - 0.2 %  Type and screen [place]     Status: None   Collection Time: 02/06/21  2:51 PM  Result Value Ref Range   ABO/RH(D) B NEG    Antibody Screen NEG    Sample Expiration      02/09/2021,2359 Performed at [facility], [address] [place], [place] [address]   POC urine preg, ED     Status: None   Collection Time: 02/06/21  3:51 PM  Result Value Ref Range   Preg Test, Ur NEGATIVE NEGATIVE  Resp Panel by RT-PCR (Flu A&B, Covid) Nasopharyngeal Swab     Status: Abnormal   Collection Time: 02/06/21  6:02 PM   Specimen: Nasopharyngeal Swab; Nasopharyngeal(NP) swabs in vial transport medium  Result Value Ref Range   SARS Coronavirus 2 by RT PCR POSITIVE (A) NEGATIVE   Influenza A by PCR NEGATIVE NEGATIVE   Influenza B by PCR NEGATIVE NEGATIVE   ____________________________________________ ____________________________________________  RADIOLOGY  I personally  reviewed all radiographic images ordered to evaluate for the above acute complaints and reviewed radiology reports and findings.  These findings were personally discussed with the patient.  Please see medical record for radiology report.  ____________________________________________   PROCEDURES  Procedure(s) performed:  Procedures    Critical Care performed: no ____________________________________________   INITIAL IMPRESSION / ASSESSMENT AND PLAN / ED COURSE  Pertinent labs & imaging results that were available during my care of the patient were reviewed by me and considered in my medical decision making (see chart for details).   DDX: gastritis, esophagitis, appendicitis, mass, uti, covid  Lori Bentley is a 35 y.o. who presents to the ED with agitation as described above.  Patient nontoxic-appearing ambulating in her room in no acute distress.  Does have some mild right-sided abdominal pain.  Exam does not show any evidence of acute GI bleed does have history of same but had reassuring colonoscopy and endoscopy.  Does not seem consistent with acute GI bleeding.  Will order ct to eval for above differential.  Clinical Course as of 02/06/21 2026  Mon Feb 06, 2021  1757 Imaging results discussed with patient.  Will order ultrasound to evaluate cystic lesion.  She is denying any vaginal discharge.  Does not describing symptoms that would be consistent with PID. [PR]  1941 Patient did test positive for COVID which should explain most of her symptoms.  Discussed option for treatment of symptomatic management as well as offered Paxil COVID.  Patient deferring Paxlovid but at this time for symptomatic management.  Discussed need for a follow-up with OB/GYN reguarding ovarian cyst [PR]    Clinical Course User Index [PR] Merlyn Lot, MD    The patient was evaluated in Emergency Department today for the symptoms described in the history of present illness. He/she was  evaluated in the context of the global COVID-19 pandemic, which necessitated consideration that the patient might be at risk for infection with the SARS-CoV-2 virus that causes COVID-19. Institutional protocols and algorithms that pertain to the evaluation of patients at risk for COVID-19 are in a state of rapid change based on information released by regulatory bodies including the CDC and  federal and state organizations. These policies and algorithms were followed during the patient's care in the ED.  As part of my medical decision making, I reviewed the following data within the [place] notes reviewed and incorporated, Labs reviewed, notes from prior ED visits and [place] Controlled Substance Database   ____________________________________________   FINAL CLINICAL IMPRESSION(S) / ED DIAGNOSES  Final diagnoses:  Abdominal pain  COVID-19  Non-intractable vomiting with nausea, unspecified vomiting type  Cyst of right ovary      NEW MEDICATIONS STARTED DURING THIS VISIT:  New Prescriptions   ONDANSETRON (ZOFRAN) 4 MG TABLET    Take 1 tablet (4 mg total) by mouth daily as needed for nausea or vomiting.   PANTOPRAZOLE (PROTONIX) 40 MG TABLET    Take 1 tablet (40 mg total) by mouth daily.     Note:  This document was prepared using Dragon voice recognition software and may include unintentional dictation errors.    Merlyn Lot, MD 02/06/21 2026

## 2021-02-06 NOTE — ED Triage Notes (Signed)
Pt generalized abd pain with one episode of coffee ground emesis and 1 episode of black stool yesterday. Pt is in NAD at present, pt ambulatory with no distress noted.

## 2021-05-02 ENCOUNTER — Ambulatory Visit: Payer: Medicaid Other | Admitting: Oncology

## 2021-05-02 ENCOUNTER — Other Ambulatory Visit: Payer: Medicaid Other

## 2021-05-10 ENCOUNTER — Inpatient Hospital Stay: Payer: Medicaid Other | Admitting: Oncology

## 2021-05-10 ENCOUNTER — Inpatient Hospital Stay: Payer: Medicaid Other

## 2021-05-16 ENCOUNTER — Other Ambulatory Visit: Payer: Self-pay

## 2021-05-16 ENCOUNTER — Inpatient Hospital Stay: Payer: Medicaid Other | Attending: Oncology

## 2021-05-16 ENCOUNTER — Encounter: Payer: Self-pay | Admitting: Oncology

## 2021-05-16 ENCOUNTER — Inpatient Hospital Stay (HOSPITAL_BASED_OUTPATIENT_CLINIC_OR_DEPARTMENT_OTHER): Payer: Medicaid Other | Admitting: Oncology

## 2021-05-16 VITALS — BP 122/98 | HR 67 | Temp 99.0°F | Resp 16 | Wt 186.8 lb

## 2021-05-16 DIAGNOSIS — D509 Iron deficiency anemia, unspecified: Secondary | ICD-10-CM | POA: Diagnosis not present

## 2021-05-16 DIAGNOSIS — D500 Iron deficiency anemia secondary to blood loss (chronic): Secondary | ICD-10-CM

## 2021-05-16 LAB — CBC
HCT: 35.8 % — ABNORMAL LOW (ref 36.0–46.0)
Hemoglobin: 10.7 g/dL — ABNORMAL LOW (ref 12.0–15.0)
MCH: 22.3 pg — ABNORMAL LOW (ref 26.0–34.0)
MCHC: 29.9 g/dL — ABNORMAL LOW (ref 30.0–36.0)
MCV: 74.6 fL — ABNORMAL LOW (ref 80.0–100.0)
Platelets: 364 10*3/uL (ref 150–400)
RBC: 4.8 MIL/uL (ref 3.87–5.11)
RDW: 20.2 % — ABNORMAL HIGH (ref 11.5–15.5)
WBC: 7 10*3/uL (ref 4.0–10.5)
nRBC: 0 % (ref 0.0–0.2)

## 2021-05-16 LAB — IRON AND TIBC
Iron: 24 ug/dL — ABNORMAL LOW (ref 28–170)
Saturation Ratios: 6 % — ABNORMAL LOW (ref 10.4–31.8)
TIBC: 417 ug/dL (ref 250–450)
UIBC: 393 ug/dL

## 2021-05-16 LAB — FERRITIN: Ferritin: 5 ng/mL — ABNORMAL LOW (ref 11–307)

## 2021-05-19 ENCOUNTER — Encounter: Payer: Self-pay | Admitting: Oncology

## 2021-05-19 NOTE — Progress Notes (Signed)
Hematology/Oncology Consult note [facility]  [phone] [phone]  Patient Care Team: Freddy Finner, NP as PCP - General (Nurse Practitioner) Sindy Guadeloupe, MD as Consulting Physician (Oncology)   Name of the patient: Lori Bentley  [id number]  1986/06/11   Date of visit: 05/19/21  Diagnosis-iron deficiency anemia likely secondary to menorrhagia  Chief complaint/ Reason for visit-routine follow-up of iron deficiency anemia  Heme/Onc history: Patient is a 35 year old female with iron deficiency anemia that has been attributed to menorrhagia in the past.  She has received IV iron in the past But was then lost to follow-up in 2018.  She was recently admitted to the hospital for significant anemia with a hemoglobin of 7.8.  She was also seen by GI and is scheduled to undergo endoscopy and colonoscopy.  EGD showed large hiatal hernia.  Colonoscopy was normal.  She has had surgery for her fibroids in 2019 and reports that since then her menstrual cycles are not as heavy as before but she still has first 1 or 2 days when her menstrual cycles would be heavy  Interval history-patient still has heavy menstrual cycles despite Mirena.  She has not seen GYN recently.  ECOG PS- 0 Pain scale- 0   Review of systems- Review of Systems  Constitutional:  Positive for malaise/fatigue. Negative for chills, fever and weight loss.  HENT:  Negative for congestion, ear discharge and nosebleeds.   Eyes:  Negative for blurred vision.  Respiratory:  Negative for cough, hemoptysis, sputum production, shortness of breath and wheezing.   Cardiovascular:  Negative for chest pain, palpitations, orthopnea and claudication.  Gastrointestinal:  Negative for abdominal pain, blood in stool, constipation, diarrhea, heartburn, melena, nausea and vomiting.  Genitourinary:  Negative for dysuria, flank pain, frequency, hematuria and urgency.       Heavy menstrual cycles   Musculoskeletal:  Negative for back pain, joint pain and myalgias.  Skin:  Negative for rash.  Neurological:  Negative for dizziness, tingling, focal weakness, seizures, weakness and headaches.  Endo/Heme/Allergies:  Does not bruise/bleed easily.  Psychiatric/Behavioral:  Negative for depression and suicidal ideas. The patient does not have insomnia.      Allergies  Allergen Reactions   Nsaids     GI BLEED-HAD BLOOD TRASNFUSION   Oxycodone Swelling   Peanut-Containing Drug Products Itching    Itching in throat   Percocet [Oxycodone-Acetaminophen] Swelling    Swelling of face and throat   Phenylephrine-Guaifenesin Other (See Comments)    Unknown reaction. Does not remember reaction     Past Medical History:  Diagnosis Date   Anxiety    Asthma    WELL CONTROLLED   Bipolar affective disorder, currently manic, mild (HCC)    Chronic anemia    Depression    Diabetes ([place]) 12/2017   patient denies diabetes   GERD (gastroesophageal reflux disease)    History of blood transfusion 2018   GI BLEED   History of hiatal hernia      Past Surgical History:  Procedure Laterality Date   COLONOSCOPY WITH PROPOFOL N/A 09/18/2019   Procedure: COLONOSCOPY WITH PROPOFOL;  Surgeon: [name], MD;  Location: [facility] ENDOSCOPY;  Service: Gastroenterology;  Laterality: N/A;   ESOPHAGOGASTRODUODENOSCOPY (EGD) WITH PROPOFOL N/A 05/18/2016   Procedure: ESOPHAGOGASTRODUODENOSCOPY (EGD) WITH PROPOFOL;  Surgeon: Manya Silvas, MD;  Location: [facility] ENDOSCOPY;  Service: Endoscopy;  Laterality: N/A;   ESOPHAGOGASTRODUODENOSCOPY (EGD) WITH PROPOFOL N/A 09/18/2019   Procedure: ESOPHAGOGASTRODUODENOSCOPY (EGD) WITH PROPOFOL;  Surgeon: Vicente Males,  Bailey Mech, MD;  Location: [place];  Service: Gastroenterology;  Laterality: N/A;   GIVENS CAPSULE STUDY N/A 11/09/2019   Procedure: GIVENS CAPSULE STUDY;  Surgeon: [name], MD;  Location: [facility] ENDOSCOPY;  Service: Gastroenterology;  Laterality: N/A;   MYOMECTOMY  N/A 01/02/2018   Procedure: MYOMECTOMY;  Surgeon: Gae Dry, MD;  Location: [facility] ORS;  Service: Gynecology;  Laterality: N/A;   TUBAL LIGATION  2012    Social History   Socioeconomic History   Marital status: Married    Spouse name: Not on file   Number of children: Not on file   Years of education: Not on file   Highest education level: Not on file  Occupational History   Not on file  Tobacco Use   Smoking status: Former    Packs/day: 0.25    Years: 1.00    Pack years: 0.25    Types: Cigarettes    Quit date: 10/24/2020    Years since quitting: 0.5   Smokeless tobacco: Never  Vaping Use   Vaping Use: Never used  Substance and Sexual Activity   Alcohol use: Not Currently   Drug use: Yes    Types: Marijuana    Comment: occasionally    Sexual activity: Yes    Birth control/protection: I.U.D.    Comment: mirena  Other Topics Concern   Not on file  Social History Narrative   Not on file   Social Determinants of Health   Financial Resource Strain: Not on file  Food Insecurity: Not on file  Transportation Needs: Not on file  Physical Activity: Not on file  Stress: Not on file  Social Connections: Not on file  Intimate Partner Violence: Not on file    Family History  Problem Relation Age of Onset   Anxiety disorder Mother    Asthma Mother    Fibroids Mother    GER disease Father      Current Outpatient Medications:    albuterol (PROVENTIL HFA;VENTOLIN HFA) 108 (90 Base) MCG/ACT inhaler, Inhale 2 puffs into the lungs every 6 (six) hours as needed for wheezing or shortness of breath., Disp: 1 Inhaler, Rfl: 0   COLACE 100 MG capsule, Take 100 mg by mouth 2 (two) times daily., Disp: , Rfl:    ferrous sulfate 325 (65 FE) MG tablet, Take 1 tablet (325 mg total) by mouth daily., Disp: 90 tablet, Rfl: 0   pantoprazole (PROTONIX) 40 MG tablet, TAKE 1 TABLET BY MOUTH DAILY, Disp: 90 tablet, Rfl: 1   clindamycin (CLINDAGEL) 1 % gel, Apply topically 2 (two) times  daily. (Patient not taking: Reported on 05/16/2021), Disp: , Rfl:    hydrALAZINE (APRESOLINE) 25 MG tablet, Take by mouth. (Patient not taking: Reported on 05/16/2021), Disp: , Rfl:    loratadine (CLARITIN) 10 MG tablet, Take 1 tablet by mouth daily. (Patient not taking: Reported on 05/16/2021), Disp: , Rfl:    metFORMIN (GLUCOPHAGE) 500 MG tablet, Take 1 tablet by mouth daily with breakfast. (Patient not taking: Reported on 05/16/2021), Disp: , Rfl:    omeprazole (PRILOSEC) 40 MG capsule, Take 1 capsule by mouth daily. (Patient not taking: Reported on 05/16/2021), Disp: , Rfl:    ondansetron (ZOFRAN) 4 MG tablet, Take 1 tablet (4 mg total) by mouth daily as needed for nausea or vomiting. (Patient not taking: Reported on 05/16/2021), Disp: 14 tablet, Rfl: 0  Physical exam:  Vitals:   05/16/21 1311  BP: (!) 122/98  Pulse: 67  Resp: 16  Temp: 99 F (37.2  C)  SpO2: 100%  Weight: 186 lb 12.8 oz (84.7 kg)   Physical Exam Constitutional:      General: She is not in acute distress. Cardiovascular:     Rate and Rhythm: Normal rate and regular rhythm.     Heart sounds: Normal heart sounds.  Pulmonary:     Effort: Pulmonary effort is normal.     Breath sounds: Normal breath sounds.  Abdominal:     General: Bowel sounds are normal.     Palpations: Abdomen is soft.  Skin:    General: Skin is warm and dry.  Neurological:     Mental Status: She is alert and oriented to person, place, and time.     CMP Latest Ref Rng & Units 02/06/2021  Glucose 70 - 99 mg/dL 103(H)  BUN 6 - 20 mg/dL 9  Creatinine 0.44 - 1.00 mg/dL 0.81  Sodium 135 - 145 mmol/L 137  Potassium 3.5 - 5.1 mmol/L 3.4(L)  Chloride 98 - 111 mmol/L 106  CO2 22 - 32 mmol/L 25  Calcium 8.9 - 10.3 mg/dL 8.1(L)  Total Protein 6.5 - 8.1 g/dL 7.5  Total Bilirubin 0.3 - 1.2 mg/dL 0.3  Alkaline Phos 38 - 126 U/L 63  AST 15 - 41 U/L 30  ALT 0 - 44 U/L 23   CBC Latest Ref Rng & Units 05/16/2021  WBC 4.0 - 10.5 K/uL 7.0  Hemoglobin  12.0 - 15.0 g/dL 10.7(L)  Hematocrit 36.0 - 46.0 % 35.8(L)  Platelets 150 - 400 K/uL 364    Assessment and plan- Patient is a 35 y.o. female with iron deficiency anemia here for routine follow-up  Patient's hemoglobin was 12.46 months ago and is down to 10.7 today with an MCV of 74.  Ferritin levels are low at 5 with an iron saturation of 6%.  Suspect her iron deficiency is secondary to her ongoing menorrhagia.  And she will need IV iron at this point which she is willing to take.  Repeat CBC ferritin and iron studies in 3 in 6 months and I will see her back in 6 months she has received IV iron in the past which she has tolerated well without any significant side effects   Visit Diagnosis 1. Iron deficiency anemia due to chronic blood loss      Dr. Randa Evens, MD, MPH [facility] [phone] 05/19/2021 7:44 AM

## 2021-05-25 ENCOUNTER — Other Ambulatory Visit: Payer: Self-pay

## 2021-05-25 ENCOUNTER — Inpatient Hospital Stay: Payer: Medicaid Other

## 2021-05-25 VITALS — BP 120/81 | HR 82 | Temp 98.3°F | Resp 18

## 2021-05-25 DIAGNOSIS — D500 Iron deficiency anemia secondary to blood loss (chronic): Secondary | ICD-10-CM

## 2021-05-25 DIAGNOSIS — D509 Iron deficiency anemia, unspecified: Secondary | ICD-10-CM | POA: Diagnosis not present

## 2021-05-25 MED ORDER — SODIUM CHLORIDE 0.9 % IV SOLN
Freq: Once | INTRAVENOUS | Status: AC
  Filled 2021-05-25: qty 250

## 2021-05-25 MED ORDER — SODIUM CHLORIDE 0.9 % IV SOLN
510.0000 mg | INTRAVENOUS | Status: DC
  Administered 2021-05-25: 510 mg via INTRAVENOUS
  Filled 2021-05-25: qty 510

## 2021-05-25 NOTE — Patient Instructions (Signed)
[facility]  Discharge Instructions: Thank you for choosing [facility] to provide your oncology and hematology care.  If you have a lab appointment with the [facility], please go directly to the [facility] and check in at the registration area.  Wear comfortable clothing and clothing appropriate for easy access to any Portacath or PICC line.   We strive to give you quality time with your provider. You may need to reschedule your appointment if you arrive late (15 or more minutes).  Arriving late affects you and other patients whose appointments are after yours.  Also, if you miss three or more appointments without notifying the office, you may be dismissed from the clinic at the provider's discretion.      For prescription refill requests, have your pharmacy contact our office and allow 72 hours for refills to be completed.    Today you received the following chemotherapy and/or immunotherapy agents ferahemeFerumoxytol Injection What is this medication? FERUMOXYTOL (FER ue MOX i tol) treats low levels of iron in your body (iron deficiency anemia). Iron is a mineral that plays an important role in making red blood cells, which carry oxygen from your lungs to the rest of your body. This medicine may be used for other purposes; ask your health care provider or pharmacist if you have questions. COMMON BRAND NAME(S): Feraheme What should I tell my care team before I take this medication? They need to know if you have any of these conditions: Anemia not caused by low iron levels High levels of iron in the blood Magnetic resonance imaging (MRI) test scheduled An unusual or allergic reaction to iron, other medications, foods, dyes, or preservatives Pregnant or trying to get pregnant Breast-feeding How should I use this medication? This medication is for injection into a vein. It is given in a hospital or clinic setting. Talk to your care team  the use of this medication in children. Special care may be needed. Overdosage: If you think you have taken too much of this medicine contact a poison control center or emergency room at once. NOTE: This medicine is only for you. Do not share this medicine with others. What if I miss a dose? It is important not to miss your dose. Call your care team if you are unable to keep an appointment. What may interact with this medication? Other iron products This list may not describe all possible interactions. Give your health care provider a list of all the medicines, herbs, non-prescription drugs, or dietary supplements you use. Also tell them if you smoke, drink alcohol, or use illegal drugs. Some items may interact with your medicine. What should I watch for while using this medication? Visit your care team regularly. Tell your care team if your symptoms do not start to get better or if they get worse. You may need blood work done while you are taking this medication. You may need to follow a special diet. Talk to your care team. Foods that contain iron include: whole grains/cereals, dried fruits, beans, or peas, leafy green vegetables, and organ meats (liver, kidney). What side effects may I notice from receiving this medication? Side effects that you should report to your care team as soon as possible: Allergic reactions-skin rash, itching, hives, swelling of the face, lips, tongue, or throat Low blood pressure-dizziness, feeling faint or lightheaded, blurry vision Shortness of breath Side effects that usually do not require medical attention (report to your care team if they  continue or are bothersome): Flushing Headache Joint pain Muscle pain Nausea Pain, redness, or irritation at injection site This list may not describe all possible side effects. Call your doctor for medical advice about side effects. You may report side effects to FDA at 1-800-FDA-1088. Where should I keep my  medication? This medication is given in a hospital or clinic and will not be stored at home. NOTE: This sheet is a summary. It may not cover all possible information. If you have questions about this medicine, talk to your doctor, pharmacist, or health care provider.  2022 Elsevier/Gold Standard (2020-12-02 15:35:12)       To help prevent nausea and vomiting after your treatment, we encourage you to take your nausea medication as directed.  BELOW ARE SYMPTOMS THAT SHOULD BE REPORTED IMMEDIATELY: *FEVER GREATER THAN 100.4 F (38 C) OR HIGHER *CHILLS OR SWEATING *NAUSEA AND VOMITING THAT IS NOT CONTROLLED WITH YOUR NAUSEA MEDICATION *UNUSUAL SHORTNESS OF BREATH *UNUSUAL BRUISING OR BLEEDING *URINARY PROBLEMS (pain or burning when urinating, or frequent urination) *BOWEL PROBLEMS (unusual diarrhea, constipation, pain near the anus) TENDERNESS IN MOUTH AND THROAT WITH OR WITHOUT PRESENCE OF ULCERS (sore throat, sores in mouth, or a toothache) UNUSUAL RASH, SWELLING OR PAIN  UNUSUAL VAGINAL DISCHARGE OR ITCHING   Items with * indicate a potential emergency and should be followed up as soon as possible or go to the Emergency Department if any problems should occur.  Please show the CHEMOTHERAPY ALERT CARD or IMMUNOTHERAPY ALERT CARD at check-in to the Emergency Department and triage nurse.  Should you have questions after your visit or need to cancel or reschedule your appointment, please contact [facility]  [phone] and follow the prompts.  Office hours are 8:00 a.m. to 4:30 p.m. Monday - Friday. Please note that voicemails left after 4:00 p.m. may not be returned until the following business day.  We are closed weekends and major holidays. You have access to a nurse at all times for urgent questions. Please call the main number to the clinic [phone] and follow the prompts.  For any non-urgent questions, you may also contact your provider using  MyChart. We now offer e-Visits for anyone 19 and older to request care online for non-urgent symptoms. For details visit [email or web address].   Also download the MyChart app! Go to the app store, search "MyChart", open the app, select [facility], and log in with your MyChart username and password.  Due to Covid, a mask is required upon entering the hospital/clinic. If you do not have a mask, one will be given to you upon arrival. For doctor visits, patients may have 1 support person aged 64 or older with them. For treatment visits, patients cannot have anyone with them due to current Covid guidelines and our immunocompromised population.

## 2021-06-01 ENCOUNTER — Other Ambulatory Visit: Payer: Self-pay

## 2021-06-01 ENCOUNTER — Inpatient Hospital Stay: Payer: Medicaid Other | Attending: Oncology

## 2021-06-01 VITALS — BP 111/77 | HR 70 | Temp 97.3°F | Resp 20

## 2021-06-01 DIAGNOSIS — D509 Iron deficiency anemia, unspecified: Secondary | ICD-10-CM | POA: Diagnosis not present

## 2021-06-01 DIAGNOSIS — D500 Iron deficiency anemia secondary to blood loss (chronic): Secondary | ICD-10-CM

## 2021-06-01 MED ORDER — SODIUM CHLORIDE 0.9 % IV SOLN
510.0000 mg | INTRAVENOUS | Status: DC
  Administered 2021-06-01: 510 mg via INTRAVENOUS
  Filled 2021-06-01: qty 17

## 2021-06-01 MED ORDER — SODIUM CHLORIDE 0.9 % IV SOLN
Freq: Once | INTRAVENOUS | Status: AC
  Filled 2021-06-01: qty 250

## 2021-06-01 NOTE — Patient Instructions (Signed)
Ferumoxytol Injection What is this medication? FERUMOXYTOL (FER ue MOX i tol) treats low levels of iron in your body (iron deficiency anemia). Iron is a mineral that plays an important role in making red blood cells, which carry oxygen from your lungs to the rest of your body. This medicine may be used for other purposes; ask your health care provider or pharmacist if you have questions. COMMON BRAND NAME(S): Feraheme What should I tell my care team before I take this medication? They need to know if you have any of these conditions: Anemia not caused by low iron levels High levels of iron in the blood Magnetic resonance imaging (MRI) test scheduled An unusual or allergic reaction to iron, other medications, foods, dyes, or preservatives Pregnant or trying to get pregnant Breast-feeding How should I use this medication? This medication is for injection into a vein. It is given in a hospital or clinic setting. Talk to your care team the use of this medication in children. Special care may be needed. Overdosage: If you think you have taken too much of this medicine contact a poison control center or emergency room at once. NOTE: This medicine is only for you. Do not share this medicine with others. What if I miss a dose? It is important not to miss your dose. Call your care team if you are unable to keep an appointment. What may interact with this medication? Other iron products This list may not describe all possible interactions. Give your health care provider a list of all the medicines, herbs, non-prescription drugs, or dietary supplements you use. Also tell them if you smoke, drink alcohol, or use illegal drugs. Some items may interact with your medicine. What should I watch for while using this medication? Visit your care team regularly. Tell your care team if your symptoms do not start to get better or if they get worse. You may need blood work done while you are taking this  medication. You may need to follow a special diet. Talk to your care team. Foods that contain iron include: whole grains/cereals, dried fruits, beans, or peas, leafy green vegetables, and organ meats (liver, kidney). What side effects may I notice from receiving this medication? Side effects that you should report to your care team as soon as possible: Allergic reactions-skin rash, itching, hives, swelling of the face, lips, tongue, or throat Low blood pressure-dizziness, feeling faint or lightheaded, blurry vision Shortness of breath Side effects that usually do not require medical attention (report to your care team if they continue or are bothersome): Flushing Headache Joint pain Muscle pain Nausea Pain, redness, or irritation at injection site This list may not describe all possible side effects. Call your doctor for medical advice about side effects. You may report side effects to FDA at 1-800-FDA-1088. Where should I keep my medication? This medication is given in a hospital or clinic and will not be stored at home. NOTE: This sheet is a summary. It may not cover all possible information. If you have questions about this medicine, talk to your doctor, pharmacist, or health care provider.  2022 Elsevier/Gold Standard (2020-12-02 15:35:12)  

## 2021-06-03 ENCOUNTER — Emergency Department
Admission: EM | Admit: 2021-06-03 | Discharge: 2021-06-03 | Disposition: A | Discharge status: Home / Self Care | Payer: No Typology Code available for payment source | Attending: Student | Admitting: Student

## 2021-06-03 ENCOUNTER — Encounter: Payer: Self-pay | Admitting: Emergency Medicine

## 2021-06-03 ENCOUNTER — Other Ambulatory Visit: Payer: Self-pay

## 2021-06-03 DIAGNOSIS — Z91010 Allergy to peanuts: Secondary | ICD-10-CM | POA: Diagnosis not present

## 2021-06-03 DIAGNOSIS — Z7951 Long term (current) use of inhaled steroids: Secondary | ICD-10-CM | POA: Insufficient documentation

## 2021-06-03 DIAGNOSIS — Z7984 Long term (current) use of oral hypoglycemic drugs: Secondary | ICD-10-CM | POA: Diagnosis not present

## 2021-06-03 DIAGNOSIS — E119 Type 2 diabetes mellitus without complications: Secondary | ICD-10-CM | POA: Insufficient documentation

## 2021-06-03 DIAGNOSIS — R0981 Nasal congestion: Secondary | ICD-10-CM | POA: Diagnosis not present

## 2021-06-03 DIAGNOSIS — Z20822 Contact with and (suspected) exposure to covid-19: Secondary | ICD-10-CM | POA: Insufficient documentation

## 2021-06-03 DIAGNOSIS — J45909 Unspecified asthma, uncomplicated: Secondary | ICD-10-CM | POA: Insufficient documentation

## 2021-06-03 DIAGNOSIS — F410 Panic disorder [episodic paroxysmal anxiety] without agoraphobia: Secondary | ICD-10-CM | POA: Insufficient documentation

## 2021-06-03 DIAGNOSIS — Z87891 Personal history of nicotine dependence: Secondary | ICD-10-CM | POA: Diagnosis not present

## 2021-06-03 LAB — URINALYSIS, ROUTINE W REFLEX MICROSCOPIC
Bilirubin Urine: NEGATIVE
Glucose, UA: NEGATIVE mg/dL
Hgb urine dipstick: NEGATIVE
Ketones, ur: 5 mg/dL — AB
Leukocytes,Ua: NEGATIVE
Nitrite: NEGATIVE
Protein, ur: NEGATIVE mg/dL
Specific Gravity, Urine: 1.028 (ref 1.005–1.030)
pH: 6 (ref 5.0–8.0)

## 2021-06-03 LAB — RESP PANEL BY RT-PCR (FLU A&B, COVID) ARPGX2
Influenza A by PCR: NEGATIVE
Influenza B by PCR: NEGATIVE
SARS Coronavirus 2 by RT PCR: NEGATIVE

## 2021-06-03 LAB — CBG MONITORING, ED: Glucose-Capillary: 123 mg/dL — ABNORMAL HIGH (ref 70–99)

## 2021-06-03 MED ORDER — ALPRAZOLAM 0.5 MG PO TABS
0.2500 mg | ORAL_TABLET | ORAL | Status: AC
  Administered 2021-06-03: 0.25 mg via ORAL
  Filled 2021-06-03: qty 1

## 2021-06-03 NOTE — ED Provider Notes (Signed)
[facility] Emergency Department Provider Note   ____________________________________________   Event Date/Time   First MD Initiated Contact with Patient 06/03/21 1739     (approximate)  I have reviewed the triage vital signs and the nursing notes.   HISTORY  Chief Complaint Anxiety    HPI Marva Sharlon Pfohl is a 35 y.o. female who reports when she woke up this morning she just did not quite feel well.  She felt just like her body was a little bit achy.  Hard to describe but she reports she just Feels like she might be starting to come down with something.  None no chest pain no cough no abdominal pain no nausea or vomiting.  She ate a sandwich this morning and then took her medications.  She was worried though that she might be developing something like "COVID".  She started feeling very anxious and tells me that when she feels anxious she will sometimes just going to sort of a trancelike not talking  Now she reports she feels much better its been a little time since she got to the ER and her symptoms are much better now she just feels a little bit anxious.  Reports in the past she is taken Xanax and it has been helpful with similar.  Normal health until this morning when she felt a little achy.  No chest pain no nausea vomiting.    Past Medical History:  Diagnosis Date   Anxiety    Asthma    WELL CONTROLLED   Bipolar affective disorder, currently manic, mild (HCC)    Chronic anemia    Depression    Diabetes ([place]) 12/2017   patient denies diabetes   GERD (gastroesophageal reflux disease)    History of blood transfusion 2018   GI BLEED   History of hiatal hernia     Patient Active Problem List   Diagnosis Date Noted   GIB (gastrointestinal bleeding) 03/27/2019   GI bleed 03/27/2019   Fibroids 12/09/2017   IDA (iron deficiency anemia) 05/16/2016   New onset of headaches 05/16/2016   Menorrhagia with regular cycle 05/16/2016    Dehydration 05/16/2016   Nausea with vomiting 05/16/2016   Blood loss anemia 04/23/2016   Hematemesis 04/23/2016   Melena 04/23/2016   Diabetes ([place]) 04/23/2016   GERD (gastroesophageal reflux disease) 04/23/2016    Past Surgical History:  Procedure Laterality Date   COLONOSCOPY WITH PROPOFOL N/A 09/18/2019   Procedure: COLONOSCOPY WITH PROPOFOL;  Surgeon: [name], MD;  Location: [facility] ENDOSCOPY;  Service: Gastroenterology;  Laterality: N/A;   ESOPHAGOGASTRODUODENOSCOPY (EGD) WITH PROPOFOL N/A 05/18/2016   Procedure: ESOPHAGOGASTRODUODENOSCOPY (EGD) WITH PROPOFOL;  Surgeon: Manya Silvas, MD;  Location[facility] ENDOSCOPY;  Service: Endoscopy;  Laterality: N/A;   ESOPHAGOGASTRODUODENOSCOPY (EGD) WITH PROPOFOL N/A 09/18/2019   Procedure: ESOPHAGOGASTRODUODENOSCOPY (EGD) WITH PROPOFOL;  Surgeon: [name], MD;  Location: [facility] ENDOSCOPY;  Service: Gastroenterology;  Laterality: N/A;   GIVENS CAPSULE STUDY N/A 11/09/2019   Procedure: GIVENS CAPSULE STUDY;  Surgeon: [name], MD;  Location: [facility] ENDOSCOPY;  Service: Gastroenterology;  Laterality: N/A;   MYOMECTOMY N/A 01/02/2018   Procedure: MYOMECTOMY;  Surgeon: Gae Dry, MD;  Location: [facility] ORS;  Service: Gynecology;  Laterality: N/A;   TUBAL LIGATION  2012    Prior to Admission medications   Medication Sig Start Date End Date Taking? Authorizing Provider  albuterol (PROVENTIL HFA;VENTOLIN HFA) 108 (90 Base) MCG/ACT inhaler Inhale 2 puffs into the lungs every 6 (six) hours as needed  for wheezing or shortness of breath. 11/29/17   Darel Hong, MD  clindamycin (CLINDAGEL) 1 % gel Apply topically 2 (two) times daily. Patient not taking: Reported on 05/16/2021 04/08/21   [provider]  COLACE 100 MG capsule Take 100 mg by mouth 2 (two) times daily. 04/09/21   [provider]  ferrous sulfate 325 (65 FE) MG tablet Take 1 tablet (325 mg total) by mouth daily. 02/06/21 05/16/21  Merlyn Lot, MD  FLOVENT HFA 110  MCG/ACT inhaler Inhale 2 puffs into the lungs in the morning and at bedtime. 01/11/21   [provider]  hydrALAZINE (APRESOLINE) 25 MG tablet Take by mouth. Patient not taking: Reported on 05/16/2021    [provider]  loratadine (CLARITIN) 10 MG tablet Take 1 tablet by mouth daily. Patient not taking: Reported on 05/16/2021 07/05/14   [provider]  metFORMIN (GLUCOPHAGE) 500 MG tablet Take 1 tablet by mouth daily with breakfast. Patient not taking: Reported on 05/16/2021 07/02/14   [provider]  omeprazole (PRILOSEC) 40 MG capsule Take 1 capsule by mouth daily. Patient not taking: Reported on 05/16/2021 07/05/14   [provider]  ondansetron (ZOFRAN) 4 MG tablet Take 1 tablet (4 mg total) by mouth daily as needed for nausea or vomiting. Patient not taking: Reported on 05/16/2021 02/06/21 02/06/22  Merlyn Lot, MD  pantoprazole ([place]) 40 MG tablet TAKE 1 TABLET BY MOUTH DAILY 04/11/20   [name], MD    Allergies Nsaids, Oxycodone, Peanut-containing drug products, Percocet [oxycodone-acetaminophen], and Phenylephrine-guaifenesin  Family History  Problem Relation Age of Onset   Anxiety disorder Mother    Asthma Mother    Fibroids Mother    GER disease Father     Social History Social History   Tobacco Use   Smoking status: Former    Packs/day: 0.25    Years: 1.00    Pack years: 0.25    Types: Cigarettes    Quit date: 10/24/2020    Years since quitting: 0.6   Smokeless tobacco: Never  Vaping Use   Vaping Use: Never used  Substance Use Topics   Alcohol use: Not Currently   Drug use: Yes    Types: Marijuana    Comment: occasionally     Review of Systems Constitutional: No fever/chills but feeling little achy slightly fatigued this morning from her normal Eyes: No visual changes. ENT: No sore throat. Cardiovascular: Denies chest pain. Respiratory: Denies shortness of breath. Gastrointestinal: No abdominal pain.    Genitourinary: Negative for dysuria.  No burning with urination.  Denies pregnancy.  Reports previous tubal ligation and use of an IUD. Skin: Negative for rash. Neurological: Negative for areas of focal weakness or numbness.  Had a very slight headache also when she woke up that is since gone away  No suicidal ideation  ____________________________________________   PHYSICAL EXAM:  VITAL SIGNS: ED Triage Vitals  Enc Vitals Group     BP 06/03/21 1658 (!) 128/97     Pulse Rate 06/03/21 1655 84     Resp 06/03/21 1655 20     Temp 06/03/21 1655 98.8 F (37.1 C)     Temp Source 06/03/21 1655 Oral     SpO2 06/03/21 1655 98 %     Weight --      Height --      Head Circumference --      Peak Flow --      Pain Score 06/03/21 1657 5     Pain Loc --  Pain Edu? --      Excl. in [place]? --     Constitutional: Alert and oriented. Well appearing and in no acute distress.  She is very pleasant in no distress.  Speaks clearly answers questions appropriately. Eyes: Conjunctivae are normal. Head: Atraumatic. Nose: Very mild clear rhinorrhea Mouth/Throat: Mucous membranes are moist.   Neck: No stridor.  Cardiovascular: Normal rate, regular rhythm. Grossly normal heart sounds.  Good peripheral circulation. Respiratory: Normal respiratory effort.  No retractions. Lungs CTAB. Gastrointestinal: Soft and nontender. No distention. Musculoskeletal: No lower extremity tenderness nor edema. Neurologic:  Normal speech and language. No gross focal neurologic deficits are appreciated.  Skin:  Skin is warm, dry and intact. No rash noted. Psychiatric: Mood and affect are normal. Speech and behavior are normal.  ____________________________________________   LABS (all labs ordered are listed, but only abnormal results are displayed)  Labs Reviewed  URINALYSIS, ROUTINE W REFLEX MICROSCOPIC - Abnormal; Notable for the following components:      Result Value   Color, Urine YELLOW (*)    APPearance  HAZY (*)    Ketones, ur 5 (*)    All other components within normal limits  CBG MONITORING, ED - Abnormal; Notable for the following components:   Glucose-Capillary 123 (*)    All other components within normal limits  RESP PANEL BY RT-PCR (FLU A&B, COVID) [id number]   ____________________________________________  EKG   ____________________________________________  RADIOLOGY   ____________________________________________   PROCEDURES  Procedure(s) performed: None  Procedures  Critical Care performed: No  ____________________________________________   INITIAL IMPRESSION / ASSESSMENT AND PLAN / ED COURSE  Pertinent labs & imaging results that were available during my care of the patient were reviewed by me and considered in my medical decision making (see chart for details).   Nontoxic well-appearing.  Initially presenting for possible panic attack.  She does report similar symptoms and she is calm reports her symptoms of feeling panic attack and not being able to speak of gone away.  This happened in the past when she feels panicky.  She reports she felt like she got very anxious because she woke with a slight headache and feeling of achiness.  She is concerned and wants to make sure she does not have COVID.  Very reassuring exam.  Normal pulmonary exam.  Normal vital signs.  Normal level of alertness full clear conversation.  No evidence of acute psychosis.   Based on the patient's complaint and her well appearance may be experiencing some very early pro viral type symptoms, though unclear.  Overall appears medically very well.  At this point we will check COVID test, urinalysis.  Give small dose of Xanax which she reports is worked well for her in the past.  Husband brought her here.  She does report having outpatient psychiatrist whom she can follow-up with and if her COVID test is normal to be comfortable doing so.  I think is very appropriate plan.   With  ----------------------------------------- 8:05 PM on 06/03/2021 ----------------------------------------- Patient resting comfortably without distress reassuring urinalysis and COVID panel  Discussed with patient, updated on results.  She is resting very comfortably feels well and comfortable being discharged.  She reports that she is going to rejoin her husband and son who are actually waiting for care right now, her son is developed a mild headache and some upper respiratory symptoms today as well.  Advised patient of careful return precautions  Return precautions and treatment recommendations and follow-up discussed with the patient  who is agreeable with the plan.       ____________________________________________   FINAL CLINICAL IMPRESSION(S) / ED DIAGNOSES  Final diagnoses:  Anxiety attack  Nasal congestion        Note:  This document was prepared using Dragon voice recognition software and may include unintentional dictation errors       Delman Kitten, MD 06/03/21 2005

## 2021-06-03 NOTE — ED Notes (Signed)
Patient is stable in NAD. She is calm and cooperative. She said she will follow up with her psychiatrist on Monday. Discharge instruction given and reviewed. She verbalized understanding. She went home via husband. No issues.

## 2021-06-03 NOTE — ED Notes (Signed)
Report to include Situation, Background, Assessment, and Recommendations received from [facility]. Patient alert and oriented, warm and dry, in no acute distress. Patient denies SI, HI, AVH and pain. Patient made aware of Q15 minute rounds and Engineer, drilling presence for their safety. Patient instructed to come to me with needs or concerns.

## 2021-06-03 NOTE — ED Provider Notes (Signed)
Emergency Medicine Provider Triage Evaluation Note  Avarey Naquall Elamin , a 35 y.o. female  was evaluated in triage.  Pt complains of Pt family brought to ED stating that she has panic/anxiety attacks and them will not respond to anyone Pt is alert with eyes open and will respond with direct questions  She denies any pain .  Review of Systems  Positive: Anxiety  Negative: Fall, altered mental status, pain  Physical Exam  There were no vitals taken for this visit. Gen:   Awake, no distress   Resp:  Normal effort  MSK:   Moves extremities without difficulty  Other:    Medical Decision Making  Medically screening exam initiated at 4:56 PM.  Appropriate orders placed.  Kady Naquall Kras was informed that the remainder of the evaluation will be completed by another provider, this initial triage assessment does not replace that evaluation, and the importance of remaining in the ED until their evaluation is complete.  Pt will be sent to the [facility] for further eval    Willaim Rayas, NP 06/03/21 [place], Eugenio Saenz, MD 06/03/21 Drema Halon

## 2021-06-03 NOTE — ED Triage Notes (Signed)
Pt to ED via POV, pt came in with eyes open but would not answer questions. Pt did respond when Helene Kelp asked if she could hear [place] talking to her. Pt will answer some questions. Pt husband reports that pt has a hx/o panic attacks and when she has them, this is how she responds. Pt states that she does take medication for anxiety but does not feel like it is working. Pt states that she feels fatigue and hot all over. Pt is tearful.

## 2021-06-23 ENCOUNTER — Encounter: Payer: Self-pay | Admitting: Emergency Medicine

## 2021-06-23 ENCOUNTER — Emergency Department
Admission: EM | Admit: 2021-06-23 | Discharge: 2021-06-23 | Disposition: A | Discharge status: Home / Self Care | Payer: Medicaid Other | Attending: Emergency Medicine | Admitting: Emergency Medicine

## 2021-06-23 ENCOUNTER — Other Ambulatory Visit: Payer: Self-pay

## 2021-06-23 DIAGNOSIS — J45909 Unspecified asthma, uncomplicated: Secondary | ICD-10-CM | POA: Diagnosis not present

## 2021-06-23 DIAGNOSIS — M791 Myalgia, unspecified site: Secondary | ICD-10-CM | POA: Diagnosis not present

## 2021-06-23 DIAGNOSIS — Z20822 Contact with and (suspected) exposure to covid-19: Secondary | ICD-10-CM | POA: Diagnosis not present

## 2021-06-23 DIAGNOSIS — J029 Acute pharyngitis, unspecified: Secondary | ICD-10-CM | POA: Diagnosis present

## 2021-06-23 DIAGNOSIS — Z7951 Long term (current) use of inhaled steroids: Secondary | ICD-10-CM | POA: Insufficient documentation

## 2021-06-23 DIAGNOSIS — Z87891 Personal history of nicotine dependence: Secondary | ICD-10-CM | POA: Diagnosis not present

## 2021-06-23 DIAGNOSIS — E119 Type 2 diabetes mellitus without complications: Secondary | ICD-10-CM | POA: Diagnosis not present

## 2021-06-23 DIAGNOSIS — J111 Influenza due to unidentified influenza virus with other respiratory manifestations: Secondary | ICD-10-CM

## 2021-06-23 DIAGNOSIS — Z91010 Allergy to peanuts: Secondary | ICD-10-CM | POA: Insufficient documentation

## 2021-06-23 DIAGNOSIS — H9209 Otalgia, unspecified ear: Secondary | ICD-10-CM | POA: Diagnosis not present

## 2021-06-23 LAB — RESP PANEL BY RT-PCR (FLU A&B, COVID) ARPGX2
Influenza A by PCR: NEGATIVE
Influenza B by PCR: NEGATIVE
SARS Coronavirus 2 by RT PCR: NEGATIVE

## 2021-06-23 MED ORDER — OSELTAMIVIR PHOSPHATE 75 MG PO CAPS: 75.0000 mg | ORAL_CAPSULE | Freq: Two times a day (BID) | ORAL | 0 refills | Status: AC

## 2021-06-23 NOTE — ED Triage Notes (Signed)
Pt reports kids dx'd with flu yesterday and now she has the fever, congestion and general bodyaches and bilateral ear pain

## 2021-06-23 NOTE — ED Provider Notes (Signed)
[facility] Emergency Department Provider Note   ____________________________________________    I have reviewed the triage vital signs and the nursing notes.   HISTORY  Chief Complaint Otalgia, Sore Throat, and Generalized Body Aches     HPI Lori Bentley is a 35 y.o. female who presents with complaints of body aches, sore throat, ear discomfort, chills.  She reports symptoms of been ongoing over the last several days.  Has been taking over-the-counter medications  Past Medical History:  Diagnosis Date   Anxiety    Asthma    WELL CONTROLLED   Bipolar affective disorder, currently manic, mild (HCC)    Chronic anemia    Depression    Diabetes ([place]) 12/2017   patient denies diabetes   GERD (gastroesophageal reflux disease)    History of blood transfusion 2018   GI BLEED   History of hiatal hernia     Patient Active Problem List   Diagnosis Date Noted   GIB (gastrointestinal bleeding) 03/27/2019   GI bleed 03/27/2019   Fibroids 12/09/2017   IDA (iron deficiency anemia) 05/16/2016   New onset of headaches 05/16/2016   Menorrhagia with regular cycle 05/16/2016   Dehydration 05/16/2016   Nausea with vomiting 05/16/2016   Blood loss anemia 04/23/2016   Hematemesis 04/23/2016   Melena 04/23/2016   Diabetes ([place]) 04/23/2016   GERD (gastroesophageal reflux disease) 04/23/2016    Past Surgical History:  Procedure Laterality Date   COLONOSCOPY WITH PROPOFOL N/A 09/18/2019   Procedure: COLONOSCOPY WITH PROPOFOL;  Surgeon: [name], MD;  Location: [facility] ENDOSCOPY;  Service: Gastroenterology;  Laterality: N/A;   ESOPHAGOGASTRODUODENOSCOPY (EGD) WITH PROPOFOL N/A 05/18/2016   Procedure: ESOPHAGOGASTRODUODENOSCOPY (EGD) WITH PROPOFOL;  Surgeon: Manya Silvas, MD;  Location: [facility] ENDOSCOPY;  Service: Endoscopy;  Laterality: N/A;   ESOPHAGOGASTRODUODENOSCOPY (EGD) WITH PROPOFOL N/A 09/18/2019   Procedure: ESOPHAGOGASTRODUODENOSCOPY  (EGD) WITH PROPOFOL;  Surgeon: [name], MD;  Location: [facility] ENDOSCOPY;  Service: Gastroenterology;  Laterality: N/A;   GIVENS CAPSULE STUDY N/A 11/09/2019   Procedure: GIVENS CAPSULE STUDY;  Surgeon: [name], MD;  Location: [facility] ENDOSCOPY;  Service: Gastroenterology;  Laterality: N/A;   MYOMECTOMY N/A 01/02/2018   Procedure: MYOMECTOMY;  Surgeon: Gae Dry, MD;  Location: [facility] ORS;  Service: Gynecology;  Laterality: N/A;   TUBAL LIGATION  2012    Prior to Admission medications   Medication Sig Start Date End Date Taking? Authorizing Provider  oseltamivir (TAMIFLU) 75 MG capsule Take 1 capsule (75 mg total) by mouth 2 (two) times daily for 5 days. 06/23/21 06/28/21 Yes Lavonia Drafts, MD  albuterol (PROVENTIL HFA;VENTOLIN HFA) 108 (90 Base) MCG/ACT inhaler Inhale 2 puffs into the lungs every 6 (six) hours as needed for wheezing or shortness of breath. 11/29/17   Darel Hong, MD  clindamycin (CLINDAGEL) 1 % gel Apply topically 2 (two) times daily. Patient not taking: Reported on 05/16/2021 04/08/21   [provider]  COLACE 100 MG capsule Take 100 mg by mouth 2 (two) times daily. 04/09/21   [provider]  ferrous sulfate 325 (65 FE) MG tablet Take 1 tablet (325 mg total) by mouth daily. 02/06/21 05/16/21  Merlyn Lot, MD  FLOVENT HFA 110 MCG/ACT inhaler Inhale 2 puffs into the lungs in the morning and at bedtime. 01/11/21   [provider]  hydrALAZINE (APRESOLINE) 25 MG tablet Take by mouth. Patient not taking: Reported on 05/16/2021    [provider]  loratadine (CLARITIN) 10 MG tablet Take 1 tablet by  mouth daily. Patient not taking: Reported on 05/16/2021 07/05/14   [provider]  metFORMIN (GLUCOPHAGE) 500 MG tablet Take 1 tablet by mouth daily with breakfast. Patient not taking: Reported on 05/16/2021 07/02/14   [provider]  omeprazole (PRILOSEC) 40 MG capsule Take 1 capsule by mouth daily. Patient not taking:  Reported on 05/16/2021 07/05/14   [provider]  ondansetron (ZOFRAN) 4 MG tablet Take 1 tablet (4 mg total) by mouth daily as needed for nausea or vomiting. Patient not taking: Reported on 05/16/2021 02/06/21 02/06/22  Merlyn Lot, MD  pantoprazole ([place]) 40 MG tablet TAKE 1 TABLET BY MOUTH DAILY 04/11/20   [name], MD     Allergies Nsaids, Oxycodone, Peanut-containing drug products, Percocet [oxycodone-acetaminophen], and Phenylephrine-guaifenesin  Family History  Problem Relation Age of Onset   Anxiety disorder Mother    Asthma Mother    Fibroids Mother    GER disease Father     Social History Social History   Tobacco Use   Smoking status: Former    Packs/day: 0.25    Years: 1.00    Pack years: 0.25    Types: Cigarettes    Quit date: 10/24/2020    Years since quitting: 0.6   Smokeless tobacco: Never  Vaping Use   Vaping Use: Never used  Substance Use Topics   Alcohol use: Not Currently   Drug use: Yes    Types: Marijuana    Comment: occasionally     Review of Systems  Constitutional: As above  ENT: As above   Gastrointestinal: No abdominal pain.  No nausea, no vomiting.   Genitourinary: Negative for dysuria. Musculoskeletal: Some myalgias Skin: Negative for rash. Neurological: Negative for headaches     ____________________________________________   PHYSICAL EXAM:  VITAL SIGNS: ED Triage Vitals  Enc Vitals Group     BP 06/23/21 1200 126/74     Pulse Rate 06/23/21 1200 (!) 104     Resp 06/23/21 1200 20     Temp 06/23/21 1200 98.4 F (36.9 C)     Temp Source 06/23/21 1200 Oral     SpO2 06/23/21 1200 96 %     Weight 06/23/21 1122 85 kg (187 lb 6.3 oz)     Height 06/23/21 1122 1.524 m (5')     Head Circumference --      Peak Flow --      Pain Score 06/23/21 1122 9     Pain Loc --      Pain Edu? --      Excl. in [place]? --      Constitutional: Alert and oriented. No acute distress. Pleasant and interactive Eyes: Conjunctivae  are normal.  Head: Atraumatic. Nose: Positive congestion. Mouth/Throat: Mucous membranes are moist.   Cardiovascular: Normal rate, regular rhythm.  Respiratory: Normal respiratory effort.  No retractions. Genitourinary: deferred Musculoskeletal: No lower extremity tenderness nor edema.   Neurologic:  Normal speech and language. No gross focal neurologic deficits are appreciated.   Skin:  Skin is warm, dry and intact. No rash noted.   ____________________________________________   LABS (all labs ordered are listed, but only abnormal results are displayed)  Labs Reviewed  RESP PANEL BY RT-PCR (FLU A&B, COVID) [id number]   ____________________________________________  EKG   ____________________________________________  RADIOLOGY   ____________________________________________   PROCEDURES  Procedure(s) performed: No  Procedures   Critical Care performed: No ____________________________________________   INITIAL IMPRESSION / ASSESSMENT AND PLAN / ED COURSE  Pertinent labs & imaging results that were available  during my care of the patient were reviewed by me and considered in my medical decision making (see chart for details).   Patient well-appearing and in no acute distress, high prevalence of influenza and COVID at this time.  Symptoms are certainly consistent with upper respiratory viral illness.  PCR test is pending, will Rx Tamiflu with instructions to only fill if flu test positive   ____________________________________________   FINAL CLINICAL IMPRESSION(S) / ED DIAGNOSES  Final diagnoses:  Influenza-like illness      NEW MEDICATIONS STARTED DURING THIS VISIT:  Discharge Medication List as of 06/23/2021 12:19 PM     START taking these medications   Details  oseltamivir (TAMIFLU) 75 MG capsule Take 1 capsule (75 mg total) by mouth 2 (two) times daily for 5 days., Starting Fri 06/23/2021, Until Wed 06/28/2021, Print         Note:  This  document was prepared using Dragon voice recognition software and may include unintentional dictation errors.    Lavonia Drafts, MD 06/23/21 2003

## 2021-07-04 ENCOUNTER — Other Ambulatory Visit: Payer: Self-pay

## 2021-07-04 ENCOUNTER — Emergency Department
Admission: EM | Admit: 2021-07-04 | Discharge: 2021-07-04 | Disposition: A | Discharge status: Home / Self Care | Payer: Medicaid Other | Attending: Emergency Medicine | Admitting: Emergency Medicine

## 2021-07-04 DIAGNOSIS — Z91010 Allergy to peanuts: Secondary | ICD-10-CM | POA: Diagnosis not present

## 2021-07-04 DIAGNOSIS — Z7951 Long term (current) use of inhaled steroids: Secondary | ICD-10-CM | POA: Diagnosis not present

## 2021-07-04 DIAGNOSIS — E119 Type 2 diabetes mellitus without complications: Secondary | ICD-10-CM | POA: Diagnosis not present

## 2021-07-04 DIAGNOSIS — K2901 Acute gastritis with bleeding: Secondary | ICD-10-CM | POA: Diagnosis not present

## 2021-07-04 DIAGNOSIS — Z7984 Long term (current) use of oral hypoglycemic drugs: Secondary | ICD-10-CM | POA: Insufficient documentation

## 2021-07-04 DIAGNOSIS — Z87891 Personal history of nicotine dependence: Secondary | ICD-10-CM | POA: Insufficient documentation

## 2021-07-04 DIAGNOSIS — K219 Gastro-esophageal reflux disease without esophagitis: Secondary | ICD-10-CM | POA: Insufficient documentation

## 2021-07-04 DIAGNOSIS — R1110 Vomiting, unspecified: Secondary | ICD-10-CM | POA: Diagnosis present

## 2021-07-04 DIAGNOSIS — J45909 Unspecified asthma, uncomplicated: Secondary | ICD-10-CM | POA: Insufficient documentation

## 2021-07-04 LAB — CBC WITH DIFFERENTIAL/PLATELET
Abs Immature Granulocytes: 0.04 10*3/uL (ref 0.00–0.07)
Basophils Absolute: 0.1 10*3/uL (ref 0.0–0.1)
Basophils Relative: 0 %
Eosinophils Absolute: 0.5 10*3/uL (ref 0.0–0.5)
Eosinophils Relative: 4 %
HCT: 41.9 % (ref 36.0–46.0)
Hemoglobin: 13.2 g/dL (ref 12.0–15.0)
Immature Granulocytes: 0 %
Lymphocytes Relative: 23 %
Lymphs Abs: 2.7 10*3/uL (ref 0.7–4.0)
MCH: 25.6 pg — ABNORMAL LOW (ref 26.0–34.0)
MCHC: 31.5 g/dL (ref 30.0–36.0)
MCV: 81.4 fL (ref 80.0–100.0)
Monocytes Absolute: 0.3 10*3/uL (ref 0.1–1.0)
Monocytes Relative: 3 %
Neutro Abs: 7.9 10*3/uL — ABNORMAL HIGH (ref 1.7–7.7)
Neutrophils Relative %: 70 %
Platelets: 420 10*3/uL — ABNORMAL HIGH (ref 150–400)
RBC: 5.15 MIL/uL — ABNORMAL HIGH (ref 3.87–5.11)
RDW: 21.7 % — ABNORMAL HIGH (ref 11.5–15.5)
Smear Review: NORMAL
WBC: 11.4 10*3/uL — ABNORMAL HIGH (ref 4.0–10.5)
nRBC: 0 % (ref 0.0–0.2)

## 2021-07-04 LAB — COMPREHENSIVE METABOLIC PANEL
ALT: 18 U/L (ref 0–44)
AST: 16 U/L (ref 15–41)
Albumin: 4.1 g/dL (ref 3.5–5.0)
Alkaline Phosphatase: 64 U/L (ref 38–126)
Anion gap: 6 (ref 5–15)
BUN: 12 mg/dL (ref 6–20)
CO2: 27 mmol/L (ref 22–32)
Calcium: 9.2 mg/dL (ref 8.9–10.3)
Chloride: 100 mmol/L (ref 98–111)
Creatinine, Ser: 0.67 mg/dL (ref 0.44–1.00)
GFR, Estimated: >60 mL/min (ref >60)
Glucose, Bld: 114 mg/dL — ABNORMAL HIGH (ref 70–99)
Potassium: 4.2 mmol/L (ref 3.5–5.1)
Sodium: 133 mmol/L — ABNORMAL LOW (ref 135–145)
Total Bilirubin: 0.4 mg/dL (ref 0.3–1.2)
Total Protein: 7.5 g/dL (ref 6.5–8.1)

## 2021-07-04 MED ORDER — SUCRALFATE 1 G PO TABS: 1.0000 g | ORAL_TABLET | Freq: Three times a day (TID) | ORAL | 0 refills | Status: DC

## 2021-07-04 NOTE — ED Triage Notes (Signed)
Pt presents to ED with c/o of 1 episode of coffee ground emesis and 1 episode of black tarry stool. Pt states she has a HX of needing iron infusions. Pt is A&Ox4.   Pt state seen at PCP and states rectal exam "was positive" but states Hgb was "fine".

## 2021-07-04 NOTE — ED Provider Notes (Signed)
[facility] Emergency Department Provider Note   ____________________________________________    I have reviewed the triage vital signs and the nursing notes.   HISTORY  Chief Complaint dark stool     HPI Lori Bentley is a 35 y.o. female with history as detailed below who presents with complaints of dark stool this morning and an episode of vomiting. She notes stool was dark. Went to her PCP and was told 'trace' blood in stool and referred to ED. Mild epigastric discomfort, nausea improved. No dizziness.  Past Medical History:  Diagnosis Date   Anxiety    Asthma    WELL CONTROLLED   Bipolar affective disorder, currently manic, mild (HCC)    Chronic anemia    Depression    Diabetes ([place]) 12/2017   patient denies diabetes   GERD (gastroesophageal reflux disease)    History of blood transfusion 2018   GI BLEED   History of hiatal hernia     Patient Active Problem List   Diagnosis Date Noted   GIB (gastrointestinal bleeding) 03/27/2019   GI bleed 03/27/2019   Fibroids 12/09/2017   IDA (iron deficiency anemia) 05/16/2016   New onset of headaches 05/16/2016   Menorrhagia with regular cycle 05/16/2016   Dehydration 05/16/2016   Nausea with vomiting 05/16/2016   Blood loss anemia 04/23/2016   Hematemesis 04/23/2016   Melena 04/23/2016   Diabetes ([place]) 04/23/2016   GERD (gastroesophageal reflux disease) 04/23/2016    Past Surgical History:  Procedure Laterality Date   COLONOSCOPY WITH PROPOFOL N/A 09/18/2019   Procedure: COLONOSCOPY WITH PROPOFOL;  Surgeon: [name], MD;  Location: [facility] ENDOSCOPY;  Service: Gastroenterology;  Laterality: N/A;   ESOPHAGOGASTRODUODENOSCOPY (EGD) WITH PROPOFOL N/A 05/18/2016   Procedure: ESOPHAGOGASTRODUODENOSCOPY (EGD) WITH PROPOFOL;  Surgeon: Manya Silvas, MD;  Location: [facility] ENDOSCOPY;  Service: Endoscopy;  Laterality: N/A;   ESOPHAGOGASTRODUODENOSCOPY (EGD) WITH PROPOFOL N/A 09/18/2019    Procedure: ESOPHAGOGASTRODUODENOSCOPY (EGD) WITH PROPOFOL;  Surgeon: [name], MD;  Location: [facility] ENDOSCOPY;  Service: Gastroenterology;  Laterality: N/A;   GIVENS CAPSULE STUDY N/A 11/09/2019   Procedure: GIVENS CAPSULE STUDY;  Surgeon: [name], MD;  Location: [facility] ENDOSCOPY;  Service: Gastroenterology;  Laterality: N/A;   MYOMECTOMY N/A 01/02/2018   Procedure: MYOMECTOMY;  Surgeon: Gae Dry, MD;  Location: [facility] ORS;  Service: Gynecology;  Laterality: N/A;   TUBAL LIGATION  2012    Prior to Admission medications   Medication Sig Start Date End Date Taking? Authorizing Provider  sucralfate (CARAFATE) 1 g tablet Take 1 tablet (1 g total) by mouth 4 (four) times daily -  with meals and at bedtime for 15 days. 07/04/21 07/19/21 Yes Lavonia Drafts, MD  albuterol (PROVENTIL HFA;VENTOLIN HFA) 108 (90 Base) MCG/ACT inhaler Inhale 2 puffs into the lungs every 6 (six) hours as needed for wheezing or shortness of breath. 11/29/17   Darel Hong, MD  clindamycin (CLINDAGEL) 1 % gel Apply topically 2 (two) times daily. Patient not taking: Reported on 05/16/2021 04/08/21   [provider]  COLACE 100 MG capsule Take 100 mg by mouth 2 (two) times daily. 04/09/21   [provider]  ferrous sulfate 325 (65 FE) MG tablet Take 1 tablet (325 mg total) by mouth daily. 02/06/21 05/16/21  Merlyn Lot, MD  FLOVENT HFA 110 MCG/ACT inhaler Inhale 2 puffs into the lungs in the morning and at bedtime. 01/11/21   [provider]  hydrALAZINE (APRESOLINE) 25 MG tablet Take by mouth. Patient not taking: Reported  on 05/16/2021    [provider]  loratadine (CLARITIN) 10 MG tablet Take 1 tablet by mouth daily. Patient not taking: Reported on 05/16/2021 07/05/14   [provider]  metFORMIN (GLUCOPHAGE) 500 MG tablet Take 1 tablet by mouth daily with breakfast. Patient not taking: Reported on 05/16/2021 07/02/14   [provider]  omeprazole (PRILOSEC) 40  MG capsule Take 1 capsule by mouth daily. Patient not taking: Reported on 05/16/2021 07/05/14   [provider]  ondansetron (ZOFRAN) 4 MG tablet Take 1 tablet (4 mg total) by mouth daily as needed for nausea or vomiting. Patient not taking: Reported on 05/16/2021 02/06/21 02/06/22  Merlyn Lot, MD  pantoprazole ([place]) 40 MG tablet TAKE 1 TABLET BY MOUTH DAILY 04/11/20   [name], MD     Allergies Nsaids, Oxycodone, Peanut-containing drug products, Percocet [oxycodone-acetaminophen], and Phenylephrine-guaifenesin  Family History  Problem Relation Age of Onset   Anxiety disorder Mother    Asthma Mother    Fibroids Mother    GER disease Father     Social History Social History   Tobacco Use   Smoking status: Former    Packs/day: 0.25    Years: 1.00    Pack years: 0.25    Types: Cigarettes    Quit date: 10/24/2020    Years since quitting: 0.6   Smokeless tobacco: Never  Vaping Use   Vaping Use: Never used  Substance Use Topics   Alcohol use: Not Currently   Drug use: Yes    Types: Marijuana    Comment: occasionally     Review of Systems  Constitutional: No fever/chills Eyes: No visual changes.  ENT: No sore throat. Cardiovascular: Denies chest pain. Respiratory: Denies shortness of breath. Gastrointestinal: as above Genitourinary: Negative for dysuria. Musculoskeletal: Negative for back pain. Skin: Negative for rash. Neurological: Negative for headaches or weakness   ____________________________________________   PHYSICAL EXAM:  VITAL SIGNS: ED Triage Vitals [07/04/21 1251]  Enc Vitals Group     BP (!) 131/99     Pulse Rate 97     Resp 18     Temp 98.2 F (36.8 C)     Temp Source Oral     SpO2 98 %     Weight      Height      Head Circumference      Peak Flow      Pain Score 8     Pain Loc      Pain Edu?      Excl. in [place]?     Constitutional: Alert and oriented.  Eyes: Conjunctivae are normal.     Neck:  Painless  ROM Cardiovascular: Normal rate, regular rhythm. Grossly normal heart sounds.  Good peripheral circulation. Respiratory: Normal respiratory effort.  No retractions. Lungs CTAB. Gastrointestinal: Soft and nontender. No distention.  No CVA tenderness. Brown stool, faint guiac positive on rectal exam  Musculoskeletal:   Warm and well perfused Neurologic:  Normal speech and language. No gross focal neurologic deficits are appreciated.  Skin:  Skin is warm, dry and intact. No rash noted. Psychiatric: Mood and affect are normal. Speech and behavior are normal.  ____________________________________________   LABS (all labs ordered are listed, but only abnormal results are displayed)  Labs Reviewed  CBC WITH DIFFERENTIAL/PLATELET - Abnormal; Notable for the following components:      Result Value   WBC 11.4 (*)    RBC 5.15 (*)    MCH 25.6 (*)    RDW 21.7 (*)  Platelets 420 (*)    Neutro Abs 7.9 (*)    All other components within normal limits  COMPREHENSIVE METABOLIC PANEL - Abnormal; Notable for the following components:   Sodium 133 (*)    Glucose, Bld 114 (*)    All other components within normal limits   ____________________________________________  EKG   ____________________________________________  RADIOLOGY   ____________________________________________   PROCEDURES  Procedure(s) performed: No  Procedures   Critical Care performed: No ____________________________________________   INITIAL IMPRESSION / ASSESSMENT AND PLAN / ED COURSE  Pertinent labs & imaging results that were available during my care of the patient were reviewed by me and considered in my medical decision making (see chart for details).   Patient well appearing and in no acute distress. Vital signs normal. Hgb 13.2, up from 1 month ago.   Labwork otherwise unremarkable. No further dark stools nor vomiting since initial episode early this AM.   Possibility of gastritis vs. PUD. Reviewed  endoscopy from 2021, which was normal.   Not on blood thinners  Already on protonix, will add carafate and have patient f/u closely with Dr. Vicente Males of GI. No indication for admission. Did discuss with patient at length that she would need to return immediately if further anormal stools/vomiting.    ____________________________________________   FINAL CLINICAL IMPRESSION(S) / ED DIAGNOSES  Final diagnoses:  Acute gastritis with hemorrhage, unspecified gastritis type        Note:  This document was prepared using Dragon voice recognition software and may include unintentional dictation errors.    Lavonia Drafts, MD 07/04/21 [phone]

## 2021-07-06 ENCOUNTER — Other Ambulatory Visit: Payer: Self-pay

## 2021-07-06 ENCOUNTER — Emergency Department
Admission: EM | Admit: 2021-07-06 | Discharge: 2021-07-06 | Disposition: A | Discharge status: Home / Self Care | Payer: Medicaid Other | Attending: Emergency Medicine | Admitting: Emergency Medicine

## 2021-07-06 ENCOUNTER — Encounter: Payer: Self-pay | Admitting: Emergency Medicine

## 2021-07-06 ENCOUNTER — Emergency Department (HOSPITAL_COMMUNITY)
Admission: EM | Admit: 2021-07-06 | Discharge: 2021-07-07 | Disposition: A | Discharge status: Home / Self Care | Payer: Medicaid Other | Attending: Emergency Medicine | Admitting: Emergency Medicine

## 2021-07-06 ENCOUNTER — Telehealth: Payer: Self-pay | Admitting: Gastroenterology

## 2021-07-06 ENCOUNTER — Ambulatory Visit: Payer: Self-pay

## 2021-07-06 ENCOUNTER — Encounter (HOSPITAL_COMMUNITY): Payer: Self-pay | Admitting: Emergency Medicine

## 2021-07-06 DIAGNOSIS — E119 Type 2 diabetes mellitus without complications: Secondary | ICD-10-CM | POA: Diagnosis not present

## 2021-07-06 DIAGNOSIS — J45909 Unspecified asthma, uncomplicated: Secondary | ICD-10-CM | POA: Insufficient documentation

## 2021-07-06 DIAGNOSIS — N9489 Other specified conditions associated with female genital organs and menstrual cycle: Secondary | ICD-10-CM | POA: Diagnosis not present

## 2021-07-06 DIAGNOSIS — Z79899 Other long term (current) drug therapy: Secondary | ICD-10-CM | POA: Insufficient documentation

## 2021-07-06 DIAGNOSIS — Z91010 Allergy to peanuts: Secondary | ICD-10-CM | POA: Insufficient documentation

## 2021-07-06 DIAGNOSIS — K449 Diaphragmatic hernia without obstruction or gangrene: Secondary | ICD-10-CM

## 2021-07-06 DIAGNOSIS — K920 Hematemesis: Secondary | ICD-10-CM | POA: Diagnosis present

## 2021-07-06 DIAGNOSIS — N83201 Unspecified ovarian cyst, right side: Secondary | ICD-10-CM | POA: Diagnosis not present

## 2021-07-06 DIAGNOSIS — N83202 Unspecified ovarian cyst, left side: Secondary | ICD-10-CM | POA: Diagnosis not present

## 2021-07-06 DIAGNOSIS — Z87891 Personal history of nicotine dependence: Secondary | ICD-10-CM | POA: Insufficient documentation

## 2021-07-06 DIAGNOSIS — K440 Diaphragmatic hernia with obstruction, without gangrene: Secondary | ICD-10-CM | POA: Insufficient documentation

## 2021-07-06 DIAGNOSIS — Z8719 Personal history of other diseases of the digestive system: Secondary | ICD-10-CM

## 2021-07-06 DIAGNOSIS — K219 Gastro-esophageal reflux disease without esophagitis: Secondary | ICD-10-CM | POA: Insufficient documentation

## 2021-07-06 DIAGNOSIS — K625 Hemorrhage of anus and rectum: Secondary | ICD-10-CM | POA: Diagnosis present

## 2021-07-06 DIAGNOSIS — K922 Gastrointestinal hemorrhage, unspecified: Secondary | ICD-10-CM

## 2021-07-06 DIAGNOSIS — Z7984 Long term (current) use of oral hypoglycemic drugs: Secondary | ICD-10-CM | POA: Insufficient documentation

## 2021-07-06 DIAGNOSIS — K921 Melena: Secondary | ICD-10-CM | POA: Diagnosis not present

## 2021-07-06 DIAGNOSIS — R1084 Generalized abdominal pain: Secondary | ICD-10-CM

## 2021-07-06 LAB — CBC
HCT: 39.9 % (ref 36.0–46.0)
Hemoglobin: 12.7 g/dL (ref 12.0–15.0)
MCH: 25.3 pg — ABNORMAL LOW (ref 26.0–34.0)
MCHC: 31.8 g/dL (ref 30.0–36.0)
MCV: 79.6 fL — ABNORMAL LOW (ref 80.0–100.0)
Platelets: 395 10*3/uL (ref 150–400)
RBC: 5.01 MIL/uL (ref 3.87–5.11)
RDW: 21.9 % — ABNORMAL HIGH (ref 11.5–15.5)
WBC: 10.5 10*3/uL (ref 4.0–10.5)
nRBC: 0 % (ref 0.0–0.2)

## 2021-07-06 LAB — I-STAT CHEM 8, ED
BUN: 9 mg/dL (ref 6–20)
Calcium, Ion: 1.21 mmol/L (ref 1.15–1.40)
Chloride: 100 mmol/L (ref 98–111)
Creatinine, Ser: 0.8 mg/dL (ref 0.44–1.00)
Glucose, Bld: 122 mg/dL — ABNORMAL HIGH (ref 70–99)
HCT: 42 % (ref 36.0–46.0)
Hemoglobin: 14.3 g/dL (ref 12.0–15.0)
Potassium: 3.9 mmol/L (ref 3.5–5.1)
Sodium: 138 mmol/L (ref 135–145)
TCO2: 26 mmol/L (ref 22–32)

## 2021-07-06 LAB — COMPREHENSIVE METABOLIC PANEL
ALT: 21 U/L (ref 0–44)
AST: 21 U/L (ref 15–41)
Albumin: 4.1 g/dL (ref 3.5–5.0)
Alkaline Phosphatase: 60 U/L (ref 38–126)
Anion gap: 7 (ref 5–15)
BUN: 8 mg/dL (ref 6–20)
CO2: 26 mmol/L (ref 22–32)
Calcium: 9.2 mg/dL (ref 8.9–10.3)
Chloride: 101 mmol/L (ref 98–111)
Creatinine, Ser: 0.77 mg/dL (ref 0.44–1.00)
GFR, Estimated: >60 mL/min (ref >60)
Glucose, Bld: 110 mg/dL — ABNORMAL HIGH (ref 70–99)
Potassium: 3.6 mmol/L (ref 3.5–5.1)
Sodium: 134 mmol/L — ABNORMAL LOW (ref 135–145)
Total Bilirubin: 0.4 mg/dL (ref 0.3–1.2)
Total Protein: 7.7 g/dL (ref 6.5–8.1)

## 2021-07-06 LAB — TYPE AND SCREEN
ABO/RH(D): B NEG
Antibody Screen: NEGATIVE

## 2021-07-06 MED ORDER — SUCRALFATE 1 G PO TABS: 1.0000 g | ORAL_TABLET | Freq: Three times a day (TID) | ORAL | 0 refills | Status: DC

## 2021-07-06 MED ORDER — MUPIROCIN CALCIUM 2 % EX CREA
TOPICAL_CREAM | CUTANEOUS | 0 refills | Status: AC
Start: 2021-07-06 — End: 2022-07-06

## 2021-07-06 MED ORDER — ONDANSETRON HCL 4 MG PO TABS: 4.0000 mg | ORAL_TABLET | Freq: Every day | ORAL | 1 refills | Status: DC | PRN

## 2021-07-06 NOTE — ED Provider Notes (Signed)
[facility] Emergency Department Provider Note   ____________________________________________   Event Date/Time   First MD Initiated Contact with Patient 07/06/21 1024     (approximate)  I have reviewed the triage vital signs and the nursing notes.  HISTORY  Chief Complaint Rectal Bleeding and Hematemesis  HPI Veronica Naquall Gates is a 35 y.o. female who presents for dark tarry stools  LOCATION: Rectum DURATION: 2 months prior to arrival TIMING: Patient states that the dark tarry stools occur almost every day SEVERITY: Mild QUALITY: Melena CONTEXT: Patient states that she has been dealing with dark tarry stools and symptomatic anemia over the past few years including having a full work-up with GI of endoscopy, colonoscopy, and capsule endoscopy that did not show any evidence of acute abnormalities.  Patient states that she has follow-up with GI, Dr. Vicente Males, in February MODIFYING FACTORS: Denies any exacerbating or relieving factors ASSOCIATED SYMPTOMS: 1 episode of self described coffee-ground emesis 2 days prior to arrival   Per medical record review, patient has history of of symptomatic anemia          Past Medical History:  Diagnosis Date   Anxiety    Asthma    WELL CONTROLLED   Bipolar affective disorder, currently manic, mild (HCC)    Chronic anemia    Depression    Diabetes ([place]) 12/2017   patient denies diabetes   GERD (gastroesophageal reflux disease)    History of blood transfusion 2018   GI BLEED   History of hiatal hernia     Patient Active Problem List   Diagnosis Date Noted   GIB (gastrointestinal bleeding) 03/27/2019   GI bleed 03/27/2019   Fibroids 12/09/2017   IDA (iron deficiency anemia) 05/16/2016   New onset of headaches 05/16/2016   Menorrhagia with regular cycle 05/16/2016   Dehydration 05/16/2016   Nausea with vomiting 05/16/2016   Blood loss anemia 04/23/2016   Hematemesis 04/23/2016   Melena  04/23/2016   Diabetes ([place]) 04/23/2016   GERD (gastroesophageal reflux disease) 04/23/2016    Past Surgical History:  Procedure Laterality Date   COLONOSCOPY WITH PROPOFOL N/A 09/18/2019   Procedure: COLONOSCOPY WITH PROPOFOL;  Surgeon: [name], MD;  Location: [facility] ENDOSCOPY;  Service: Gastroenterology;  Laterality: N/A;   ESOPHAGOGASTRODUODENOSCOPY (EGD) WITH PROPOFOL N/A 05/18/2016   Procedure: ESOPHAGOGASTRODUODENOSCOPY (EGD) WITH PROPOFOL;  Surgeon: Manya Silvas, MD;  Location: [facility] ENDOSCOPY;  Service: Endoscopy;  Laterality: N/A;   ESOPHAGOGASTRODUODENOSCOPY (EGD) WITH PROPOFOL N/A 09/18/2019   Procedure: ESOPHAGOGASTRODUODENOSCOPY (EGD) WITH PROPOFOL;  Surgeon: [name], MD;  Location: [facility] ENDOSCOPY;  Service: Gastroenterology;  Laterality: N/A;   GIVENS CAPSULE STUDY N/A 11/09/2019   Procedure: GIVENS CAPSULE STUDY;  Surgeon: [name], MD;  Location: [facility] ENDOSCOPY;  Service: Gastroenterology;  Laterality: N/A;   MYOMECTOMY N/A 01/02/2018   Procedure: MYOMECTOMY;  Surgeon: Gae Dry, MD;  Location: [facility] ORS;  Service: Gynecology;  Laterality: N/A;   TUBAL LIGATION  2012    Prior to Admission medications   Medication Sig Start Date End Date Taking? Authorizing Provider  ondansetron (ZOFRAN) 4 MG tablet Take 1 tablet (4 mg total) by mouth daily as needed for nausea or vomiting. 07/06/21 07/06/22 Yes Yuriy Cui, Vista Lawman, MD  sucralfate (CARAFATE) 1 g tablet Take 1 tablet (1 g total) by mouth 4 (four) times daily -  with meals and at bedtime for 14 days. 07/06/21 07/20/21 Yes Nayali Talerico, Vista Lawman, MD  albuterol (PROVENTIL HFA;VENTOLIN HFA) 108 (90 Base) MCG/ACT inhaler Inhale 2  puffs into the lungs every 6 (six) hours as needed for wheezing or shortness of breath. 11/29/17   Darel Hong, MD  clindamycin (CLINDAGEL) 1 % gel Apply topically 2 (two) times daily. Patient not taking: Reported on 05/16/2021 04/08/21   [provider]  COLACE 100 MG capsule Take 100 mg by mouth  2 (two) times daily. 04/09/21   [provider]  ferrous sulfate 325 (65 FE) MG tablet Take 1 tablet (325 mg total) by mouth daily. 02/06/21 05/16/21  Merlyn Lot, MD  FLOVENT HFA 110 MCG/ACT inhaler Inhale 2 puffs into the lungs in the morning and at bedtime. 01/11/21   [provider]  hydrALAZINE (APRESOLINE) 25 MG tablet Take by mouth. Patient not taking: Reported on 05/16/2021    [provider]  loratadine (CLARITIN) 10 MG tablet Take 1 tablet by mouth daily. Patient not taking: Reported on 05/16/2021 07/05/14   [provider]  metFORMIN (GLUCOPHAGE) 500 MG tablet Take 1 tablet by mouth daily with breakfast. Patient not taking: Reported on 05/16/2021 07/02/14   [provider]  omeprazole (PRILOSEC) 40 MG capsule Take 1 capsule by mouth daily. Patient not taking: Reported on 05/16/2021 07/05/14   [provider]  pantoprazole (PROTONIX) 40 MG tablet TAKE 1 TABLET BY MOUTH DAILY 04/11/20   [name], MD    Allergies Nsaids, Oxycodone, Peanut-containing drug products, Percocet [oxycodone-acetaminophen], and Phenylephrine-guaifenesin  Family History  Problem Relation Age of Onset   Anxiety disorder Mother    Asthma Mother    Fibroids Mother    GER disease Father     Social History Social History   Tobacco Use   Smoking status: Former    Packs/day: 0.25    Years: 1.00    Pack years: 0.25    Types: Cigarettes    Quit date: 10/24/2020    Years since quitting: 0.6   Smokeless tobacco: Never  Vaping Use   Vaping Use: Never used  Substance Use Topics   Alcohol use: Not Currently   Drug use: Yes    Types: Marijuana    Comment: occasionally     Review of Systems Constitutional: No fever/chills Eyes: No visual changes. ENT: No sore throat. Cardiovascular: Denies chest pain. Respiratory: Denies shortness of breath. Gastrointestinal: No abdominal pain.  No nausea, no vomiting.  No diarrhea.  Endorses melena and 1  episode of hematemesis Genitourinary: Negative for dysuria. Musculoskeletal: Negative for acute arthralgias Skin: Negative for rash. Neurological: Negative for headaches, weakness/numbness/paresthesias in any extremity Psychiatric: Negative for suicidal ideation/homicidal ideation   ____________________________________________   PHYSICAL EXAM:  VITAL SIGNS: ED Triage Vitals [07/06/21 1001]  Enc Vitals Group     BP 114/90     Pulse Rate 99     Resp 17     Temp 98.7 F (37.1 C)     Temp Source Oral     SpO2 97 %     Weight 187 lb 6.3 oz (85 kg)     Height 5' (1.524 m)     Head Circumference      Peak Flow      Pain Score 10     Pain Loc      Pain Edu?      Excl. in [place]?    Constitutional: Alert and oriented. Well appearing and in no acute distress. Eyes: Conjunctivae are normal. PERRL. Head: Atraumatic. Nose: No congestion/rhinnorhea. Mouth/Throat: Mucous membranes are moist. Neck: No stridor Cardiovascular: Grossly normal heart sounds.  Good peripheral circulation. Respiratory: Normal  respiratory effort.  No retractions. Gastrointestinal: Soft and nontender. No distention. Musculoskeletal: No obvious deformities Neurologic:  Normal speech and language. No gross focal neurologic deficits are appreciated. Skin:  Skin is warm and dry. No rash noted. Psychiatric: Mood and affect are normal. Speech and behavior are normal.  ____________________________________________   LABS (all labs ordered are listed, but only abnormal results are displayed)  Labs Reviewed  COMPREHENSIVE METABOLIC PANEL - Abnormal; Notable for the following components:      Result Value   Sodium 134 (*)    Glucose, Bld 110 (*)    All other components within normal limits  CBC - Abnormal; Notable for the following components:   MCV 79.6 (*)    MCH 25.3 (*)    RDW 21.9 (*)    All other components within normal limits  POC OCCULT BLOOD, ED  POC URINE PREG, ED  TYPE AND SCREEN     PROCEDURES  Procedure(s) performed (including Critical Care):  .1-3 Lead EKG Interpretation Performed by: Naaman Plummer, MD Authorized by: Naaman Plummer, MD     Interpretation: normal     ECG rate:  86   ECG rate assessment: normal     Rhythm: sinus rhythm     Ectopy: none     Conduction: normal     ____________________________________________   INITIAL IMPRESSION / ASSESSMENT AND PLAN / ED COURSE  As part of my medical decision making, I reviewed the following data within the electronic medical record, if available:  Nursing notes reviewed and incorporated, Labs reviewed, EKG interpreted, Old chart reviewed, Radiograph reviewed and Notes from prior ED visits reviewed and incorporated      Given history and exam patients presentation most consistent with slow GI bleed possibly secondary to hemorrhoid or other nonemergent cause of bleeding. I have low suspicion for Aortoenteric fistula, Upper GI Bleed, IBD, Mesenteric Ischemia, Rectal foreign body or ulcer.  Workup: CBC, BMP, PT/INR, Type and Screen H/H 12.7/39.9 Patient shows no signs of anemia at this time.  Patient did not have any episodes of emesis during her stay and got no antiemetics.  Patient is p.o. tolerant without difficulty.  Patient shows no signs of anemia or life-threatening active bleeding.  Patient is stable for discharge at this time and to follow-up with Dr. Vicente Males at earliest convenience.  Disposition: Discharge. Hemodynamically stable with no gross blood on rectal exam. SRP and prompt PCP follow up.      ____________________________________________   FINAL CLINICAL IMPRESSION(S) / ED DIAGNOSES  Final diagnoses:  Melena  History of hematemesis  Generalized abdominal pain     ED Discharge Orders          Ordered    ondansetron (ZOFRAN) 4 MG tablet  Daily PRN        07/06/21 1152    sucralfate (CARAFATE) 1 g tablet  3 times daily with meals & bedtime        07/06/21 1152              Note:  This document was prepared using Dragon voice recognition software and may include unintentional dictation errors.    Naaman Plummer, MD 07/06/21 [phone]

## 2021-07-06 NOTE — Telephone Encounter (Signed)
Chief Complaint: Vomiting blood and blood in stools (black in color) Symptoms: Abdominal pain Frequency: Constant pain Pertinent Negatives: Patient denies N/A Disposition: [x] ED /[] Urgent Care (no appt availability in office) / [] Appointment(In office/virtual)/ []  [facility]/ [] Home Care/ [] Refused Recommended Disposition  Additional Notes: Patient says she will go to [facility] ED.    Reason for Disposition  [1] Constant abdominal pain AND [2] present > 2 hours  Answer Assessment - Initial Assessment Questions 1. APPEARANCE of BLOOD: "What does the blood look like?" (e.g., color, coffee-grounds)     Dark, black 2. AMOUNT: "How much blood was lost?"     N/A 3. VOMITING BLOOD: "How many times did it happen?" or "How many times in the past 24 hours?"     N/A 4. VOMITING WITHOUT BLOOD: "How many times in the past 24 hours?"      N/A 5. ONSET: "When did vomiting of blood begin?"     N/A 6. CAUSE: "What do you think is causing the vomiting of blood?"     N/A 7. BLOOD THINNERS: "Do you take any blood thinners?" (e.g., Coumadin/warfarin, Pradaxa/dabigatran, aspirin)     N/A 8. DEHYDRATION: "Are there any signs of dehydration?" "When was the last time you urinated?" "Do you feel dizzy?"     N/A 9. ABDOMINAL PAIN: "Are you having any abdominal pain?" If Yes, ask: "What does it feel like? " (e.g., crampy, dull, intermittent, constant)      Yes 10. DIARRHEA: "Is there any diarrhea?" If Yes, ask: "How many times today?"        N/A 11. OTHER SYMPTOMS: "Do you have any other symptoms?" (e.g., fever, blood in stool)       Dark, black stool 12. PREGNANCY: "Is there any chance you are pregnant?" "When was your last menstrual period?"       N/A  Protocols used: Vomiting Blood-A-AH

## 2021-07-06 NOTE — ED Triage Notes (Signed)
Pt comes into the ED via POV c/o rectal bleeding and vomiting blood that looks like coffee grounds.  Pt has been seen here for the same in the past and was told to f/u with GI , but GI cant see her until February and patient is still having this problem.  Pt admits to abd pain.  Denies any SHOB or dizziness.  Pt denies any blood thinner use.

## 2021-07-06 NOTE — ED Notes (Signed)
Pt declined DC vitals at this time.

## 2021-07-06 NOTE — ED Provider Notes (Signed)
Emergency Medicine Provider Triage Evaluation Note  Lori Bentley , a 35 y.o. female  was evaluated in triage.  Pt complains of diffuse abdominal pain for last 2 days.  Patient had initially had coffee-ground emesis and black stools.  Was seen by her primary care doctor who sent her to the emergency department on 07/04/2021 for further evaluation.  She was ultimately discharged with stable hemoglobin.  She has not had any episodes of hematemesis or melena since the initial episodes.  Was seen at [place] earlier today where she had heme positive stool and stable hemoglobin.  She was discharged.  Called her gastroenterologist who say as she cannot evaluate until February which prompted her arrival today.  Still having diffuse abdominal pain.  Review of Systems  Positive:  Negative: See above   Physical Exam  BP 119/90 (BP Location: Right Arm)   Pulse 96   Temp 98.6 F (37 C)   Resp 14   SpO2 98%  Gen:   Awake, no distress   Resp:  Normal effort  MSK:   Moves extremities without difficulty  Other:  Mild generalized abdominal tenderness  Medical Decision Making  Medically screening exam initiated at 8:05 PM.  Appropriate orders placed.  Brazil Naquall Bethel was informed that the remainder of the evaluation will be completed by another provider, this initial triage assessment does not replace that evaluation, and the importance of remaining in the ED until their evaluation is complete.     Myna Bright [place], PA-C 07/06/21 Tomasita Morrow, MD 07/07/21 [phone]

## 2021-07-06 NOTE — ED Triage Notes (Signed)
Patient here with rectal bleeding and vomiting blood.  Patient states that it looks like coffee grounds.  Patient was seen at [facility] earlier today and spoke to PCP today.  Patient needed to follow up with GI and patient doesn't have an appointment until Jan or Feb.  Patient does have abdominal pain and back pain.  Patient states that it has been going on for 2 days.

## 2021-07-06 NOTE — Telephone Encounter (Signed)
Inbound call from pt requesting a call back stating that she was in the ED on 07/04/21 and needed to be seen by Dr. Vicente Males, however he doesn't have any openings until February. Please advise. Thank you

## 2021-07-07 ENCOUNTER — Telehealth: Payer: Self-pay | Admitting: Gastroenterology

## 2021-07-07 ENCOUNTER — Other Ambulatory Visit: Payer: Self-pay

## 2021-07-07 ENCOUNTER — Emergency Department (HOSPITAL_COMMUNITY): Payer: Medicaid Other

## 2021-07-07 ENCOUNTER — Emergency Department
Admission: EM | Admit: 2021-07-07 | Discharge: 2021-07-08 | Disposition: A | Discharge status: Home / Self Care | Payer: Medicaid Other | Attending: Emergency Medicine | Admitting: Emergency Medicine

## 2021-07-07 DIAGNOSIS — Z5321 Procedure and treatment not carried out due to patient leaving prior to being seen by health care provider: Secondary | ICD-10-CM | POA: Insufficient documentation

## 2021-07-07 DIAGNOSIS — K920 Hematemesis: Secondary | ICD-10-CM | POA: Insufficient documentation

## 2021-07-07 LAB — BASIC METABOLIC PANEL
Anion gap: 7 (ref 5–15)
BUN: 9 mg/dL (ref 6–20)
CO2: 28 mmol/L (ref 22–32)
Calcium: 9.2 mg/dL (ref 8.9–10.3)
Chloride: 100 mmol/L (ref 98–111)
Creatinine, Ser: 0.71 mg/dL (ref 0.44–1.00)
GFR, Estimated: >60 mL/min (ref >60)
Glucose, Bld: 151 mg/dL — ABNORMAL HIGH (ref 70–99)
Potassium: 3.5 mmol/L (ref 3.5–5.1)
Sodium: 135 mmol/L (ref 135–145)

## 2021-07-07 LAB — CBC
HCT: 37.9 % (ref 36.0–46.0)
Hemoglobin: 12.2 g/dL (ref 12.0–15.0)
MCH: 25.8 pg — ABNORMAL LOW (ref 26.0–34.0)
MCHC: 32.2 g/dL (ref 30.0–36.0)
MCV: 80.1 fL (ref 80.0–100.0)
Platelets: 382 10*3/uL (ref 150–400)
RBC: 4.73 MIL/uL (ref 3.87–5.11)
RDW: 21.6 % — ABNORMAL HIGH (ref 11.5–15.5)
WBC: 12.4 10*3/uL — ABNORMAL HIGH (ref 4.0–10.5)
nRBC: 0 % (ref 0.0–0.2)

## 2021-07-07 LAB — I-STAT BETA HCG BLOOD, ED (MC, WL, AP ONLY): I-stat hCG, quantitative: <5 m[IU]/mL (ref <5)

## 2021-07-07 LAB — HEMOGLOBIN AND HEMATOCRIT, BLOOD
HCT: 37.9 % (ref 36.0–46.0)
Hemoglobin: 12.2 g/dL (ref 12.0–15.0)

## 2021-07-07 MED ORDER — IOHEXOL 300 MG/ML  SOLN
100.0000 mL | Freq: Once | INTRAMUSCULAR | Status: AC | PRN
  Administered 2021-07-07: 100 mL via INTRAVENOUS

## 2021-07-07 MED ORDER — SUCRALFATE 1 G PO TABS: 1.0000 g | ORAL_TABLET | Freq: Once | ORAL | Status: DC

## 2021-07-07 MED ORDER — ALUM & MAG HYDROXIDE-SIMETH 200-200-20 MG/5ML PO SUSP
30.0000 mL | Freq: Once | ORAL | Status: AC
  Administered 2021-07-07: 30 mL via ORAL
  Filled 2021-07-07: qty 30

## 2021-07-07 MED ORDER — PANTOPRAZOLE SODIUM 40 MG PO TBEC
40.0000 mg | DELAYED_RELEASE_TABLET | Freq: Once | ORAL | Status: AC
  Administered 2021-07-07: 40 mg via ORAL
  Filled 2021-07-07: qty 1

## 2021-07-07 NOTE — Telephone Encounter (Signed)
Great - thanks

## 2021-07-07 NOTE — Telephone Encounter (Signed)
Good Morning Dr. Havery Moros,  This patient called and stated that she was seen in the ED yesterday. Also, that they told her to see if she could be seen here sooner than her current provider at [place] . Looks like in the ED discharge paperwork, Elmo Putt states he states he spoke with you about getting her in for an office visit.  I just wanted to confirm that with you, before moving forward.

## 2021-07-07 NOTE — Discharge Instructions (Addendum)
Please follow-up with the [facility] office for a consultation regarding your abdominal pain and bleeding.  Please start taking your pantoprazole (Protonix) twice a day.  Do not take sucralfate (Carafate) or Pepto-Bismol because those can make your stool turn black and make it difficult to tell if you actually are having more bleeding.  You may take other antacids as needed.  You may also take acetaminophen as needed.  Return if your symptoms are getting worse.  Please note that your CT scan showed cysts on both of your ovaries.  The radiologist has recommended that you get a follow-up ultrasound to further characterize the cysts.  That can be arranged through your primary care provider.

## 2021-07-07 NOTE — Telephone Encounter (Signed)
Thank you, Dr. Havery Moros.  I scheduled her for 1/3 at 8:30am with Colleen.

## 2021-07-07 NOTE — ED Triage Notes (Signed)
Pt presents to ER stating she wants her HGB levels checked.  Pt states she has been vomiting coffee ground emesis.  Pt states she had two episodes of emesis today and was seen yesterday for same at [facility] and was told HGB was 12.  Pt A&O x4 at this time.  No acute distress noted.

## 2021-07-07 NOTE — ED Provider Notes (Signed)
[facility] EMERGENCY DEPARTMENT Provider Note   CSN: [id number] Arrival date & time: 07/06/21  1810     History Chief Complaint  Patient presents with   Hematemesis    [place] Naquall Akhter is a 35 y.o. female.  The history is provided by the patient.  She has history of GERD and had an episode of hematemesis and melena 3 days ago.  She was seen in her primary care provider's office and then in the ED at which time hemoglobin was noted to be stable.  She continues to complain of some upper abdominal pain, but stools have resumed their normal color.  She tried to get into see her gastroenterologist, but he is not available, and she was told that she would not be able to get an appointment until February.  She went into emergency at [facility] where hemoglobin was noted to be stable and she was discharged with a prescription for sucralfate.  Evidently, she was told that they did not have a gastroenterologist who could see her in the ED and they were advised to come here.  She denies dizziness or lightheadedness, but does continue to complain of upper abdominal pain.  Pain is rated at 8/10.   Past Medical History:  Diagnosis Date   Anxiety    Asthma    WELL CONTROLLED   Bipolar affective disorder, currently manic, mild (HCC)    Chronic anemia    Depression    Diabetes ([place]) 12/2017   patient denies diabetes   GERD (gastroesophageal reflux disease)    History of blood transfusion 2018   GI BLEED   History of hiatal hernia     Patient Active Problem List   Diagnosis Date Noted   GIB (gastrointestinal bleeding) 03/27/2019   GI bleed 03/27/2019   Fibroids 12/09/2017   IDA (iron deficiency anemia) 05/16/2016   New onset of headaches 05/16/2016   Menorrhagia with regular cycle 05/16/2016   Dehydration 05/16/2016   Nausea with vomiting 05/16/2016   Blood loss anemia 04/23/2016   Hematemesis 04/23/2016   Melena 04/23/2016   Diabetes  ([place]) 04/23/2016   GERD (gastroesophageal reflux disease) 04/23/2016    Past Surgical History:  Procedure Laterality Date   COLONOSCOPY WITH PROPOFOL N/A 09/18/2019   Procedure: COLONOSCOPY WITH PROPOFOL;  Surgeon: [name], MD;  Location: [facility] ENDOSCOPY;  Service: Gastroenterology;  Laterality: N/A;   ESOPHAGOGASTRODUODENOSCOPY (EGD) WITH PROPOFOL N/A 05/18/2016   Procedure: ESOPHAGOGASTRODUODENOSCOPY (EGD) WITH PROPOFOL;  Surgeon: Manya Silvas, MD;  Location: [facility] ENDOSCOPY;  Service: Endoscopy;  Laterality: N/A;   ESOPHAGOGASTRODUODENOSCOPY (EGD) WITH PROPOFOL N/A 09/18/2019   Procedure: ESOPHAGOGASTRODUODENOSCOPY (EGD) WITH PROPOFOL;  Surgeon: [name], MD;  Location: [facility] ENDOSCOPY;  Service: Gastroenterology;  Laterality: N/A;   GIVENS CAPSULE STUDY N/A 11/09/2019   Procedure: GIVENS CAPSULE STUDY;  Surgeon: [name], MD;  Location: [facility] ENDOSCOPY;  Service: Gastroenterology;  Laterality: N/A;   MYOMECTOMY N/A 01/02/2018   Procedure: MYOMECTOMY;  Surgeon: Gae Dry, MD;  Location: [facility] ORS;  Service: Gynecology;  Laterality: N/A;   TUBAL LIGATION  2012     OB History     Gravida  5   Para  4   Term  2   Preterm  2   AB  1   Living  4      SAB      IAB      Ectopic  1   Multiple      Live Births  Family History  Problem Relation Age of Onset   Anxiety disorder Mother    Asthma Mother    Fibroids Mother    GER disease Father     Social History   Tobacco Use   Smoking status: Former    Packs/day: 0.25    Years: 1.00    Pack years: 0.25    Types: Cigarettes    Quit date: 10/24/2020    Years since quitting: 0.7   Smokeless tobacco: Never  Vaping Use   Vaping Use: Never used  Substance Use Topics   Alcohol use: Not Currently   Drug use: Yes    Types: Marijuana    Comment: occasionally     Home Medications Prior to Admission medications   Medication Sig Start Date End Date Taking? Authorizing Provider  albuterol  (PROVENTIL HFA;VENTOLIN HFA) 108 (90 Base) MCG/ACT inhaler Inhale 2 puffs into the lungs every 6 (six) hours as needed for wheezing or shortness of breath. 11/29/17  Yes Darel Hong, MD  FLOVENT HFA 110 MCG/ACT inhaler Inhale 2 puffs into the lungs in the morning and at bedtime. 01/11/21  Yes [provider]  mupirocin cream (BACTROBAN) 2 % Apply to affected area 3 times daily 07/06/21 07/06/22 Yes Bradler, Vista Lawman, MD  pantoprazole (PROTONIX) 40 MG tablet TAKE 1 TABLET BY MOUTH DAILY 04/11/20  Yes [name], MD  clindamycin (CLINDAGEL) 1 % gel Apply topically 2 (two) times daily. Patient not taking: Reported on 05/16/2021 04/08/21   [provider]  COLACE 100 MG capsule Take 100 mg by mouth 2 (two) times daily. Patient not taking: Reported on 07/06/2021 04/09/21   [provider]  ferrous sulfate 325 (65 FE) MG tablet Take 1 tablet (325 mg total) by mouth daily. Patient not taking: Reported on 07/06/2021 02/06/21 05/16/21  Merlyn Lot, MD  hydrALAZINE (APRESOLINE) 25 MG tablet Take by mouth. Patient not taking: Reported on 05/16/2021    [provider]  loratadine (CLARITIN) 10 MG tablet Take 1 tablet by mouth daily. Patient not taking: Reported on 05/16/2021 07/05/14   [provider]  metFORMIN (GLUCOPHAGE) 500 MG tablet Take 1 tablet by mouth daily with breakfast. Patient not taking: Reported on 05/16/2021 07/02/14   [provider]  omeprazole (PRILOSEC) 40 MG capsule Take 1 capsule by mouth daily. Patient not taking: Reported on 05/16/2021 07/05/14   [provider]  ondansetron (ZOFRAN) 4 MG tablet Take 1 tablet (4 mg total) by mouth daily as needed for nausea or vomiting. Patient not taking: Reported on 07/06/2021 07/06/21 07/06/22  Naaman Plummer, MD  sucralfate (CARAFATE) 1 g tablet Take 1 tablet (1 g total) by mouth 4 (four) times daily -  with meals and at bedtime for 14 days. Patient not taking: Reported on 07/06/2021 07/06/21  07/20/21  Naaman Plummer, MD    Allergies    Nsaids, Oxycodone, Peanut-containing drug products, Percocet [oxycodone-acetaminophen], and Phenylephrine-guaifenesin  Review of Systems   Review of Systems  All other systems reviewed and are negative.  Physical Exam Updated Vital Signs BP 119/85 (BP Location: Left Arm)   Pulse 91   Temp 99.2 F (37.3 C)   Resp 16   SpO2 100%   Physical Exam Vitals and nursing note reviewed.  35 year old female, resting comfortably and in no acute distress. Vital signs are normal. Oxygen saturation is 100%, which is normal. Head is normocephalic and atraumatic. PERRLA, EOMI. Oropharynx is clear. Neck is nontender and supple without adenopathy or JVD.  Back is nontender and there is no CVA tenderness. Lungs are clear without rales, wheezes, or rhonchi. Chest is nontender. Heart has regular rate and rhythm without murmur. Abdomen is soft, flat, with mild epigastric tenderness.  There is no rebound or guarding.  There are no masses or hepatosplenomegaly and peristalsis is slightly hypoactive. Extremities have no cyanosis or edema, full range of motion is present. Skin is warm and dry without rash. Neurologic: Mental status is normal, cranial nerves are intact, moves all extremities equally.  ED Results / Procedures / Treatments   Labs (all labs ordered are listed, but only abnormal results are displayed) Labs Reviewed  I-STAT CHEM 8, ED - Abnormal; Notable for the following components:      Result Value   Glucose, Bld 122 (*)    All other components within normal limits  HEMOGLOBIN AND HEMATOCRIT, BLOOD  I-STAT BETA HCG BLOOD, ED ([facility], [facility], [facility] ONLY)    Radiology CT ABDOMEN PELVIS W CONTRAST  Result Date: 07/07/2021 CLINICAL DATA:  Abdominal pain, rectal bleeding, vomiting blood. EXAM: CT ABDOMEN AND PELVIS WITH CONTRAST TECHNIQUE: Multidetector CT imaging of the abdomen and pelvis was performed using the standard protocol following bolus  administration of intravenous contrast. CONTRAST:  170mL OMNIPAQUE IOHEXOL 300 MG/ML  SOLN COMPARISON:  02/06/2021. FINDINGS: Lower chest: The lung bases are clear. There is a large hiatal hernia, only partially visualized on exam. Hepatobiliary: No focal liver abnormality is seen. No gallstones, gallbladder wall thickening, or biliary dilatation. Pancreas: Unremarkable. No pancreatic ductal dilatation or surrounding inflammatory changes. Spleen: There is a cyst in the medial aspect of the spleen measuring 2.0 cm. Adrenals/Urinary Tract: No adrenal nodule or mass. No renal calculus or hydronephrosis. The kidneys enhance symmetrically. The bladder is unremarkable. Stomach/Bowel: Large hiatal hernia. The stomach is otherwise unremarkable. No bowel obstruction, free air or pneumatosis. No focal bowel wall thickening is identified. A normal appendix is seen in the right lower quadrant. Vascular/Lymphatic: No significant vascular findings are present. No enlarged abdominal or pelvic lymph nodes. Reproductive: An IUD is present in the uterus. The uterus has a lobular contour, possibly representing underlying fibroids. There is a cystic structure in the left adnexa measuring 3.1 cm. Cysts are noted in the right adnexa measuring 4.2 cm and 4.5 cm. Bilateral tubal ligation clips are noted. Other: No free fluid in the pelvis. Musculoskeletal: No acute osseous abnormality. IMPRESSION: 1. No acute intra-abdominal process. 2. Large hiatal hernia. 3. Splenic cyst. 4. Bilateral ovarian cysts, slightly decreased in size on the right and new cyst on the left. Ultrasound is suggested for further evaluation on follow-up. Electronically Signed   By: Brett Fairy M.D.   On: 07/07/2021 04:03    Procedures Procedures   Medications Ordered in ED Medications  iohexol (OMNIPAQUE) 300 MG/ML solution 100 mL (100 mLs Intravenous Contrast Given 07/07/21 0350)    ED Course  I have reviewed the triage vital signs and the nursing  notes.  Pertinent labs & imaging results that were available during my care of the patient were reviewed by me and considered in my medical decision making (see chart for details).   MDM Rules/Calculators/A&P                         Ongoing abdominal pain in the setting of known hiatal hernia and recent episode of hematemesis.  I-STAT was obtained at triage and shows hemoglobin actually increased compared with recent values.  However, this is done by  different technique and main lab hemoglobin is ordered.  Patient has normal vital signs, no evidence of significant bleeding.  She was sent for CT of abdomen and pelvis which shows a large hiatal hernia and bilateral ovarian cysts with recommendation for ultrasound follow-up.  I have explained these findings to the patient and her family and recommended increasing pantoprazole to twice a day.  However, they were quite upset and wanted to see a gastroenterologist for endoscopy while in the emergency department.  I have advised them that is not indicated given her stable hemodynamic state.  However, they were very upset and stated they wanted to go to [facility] or another hospital for another opinion.  I spoke with Dr. Havery Moros, on-call for gastroenterology, who states that he can see the patient in consultation in his office, but agrees no indication for emergent endoscopy.  I have gone back to talk with the patient, and the patient and family appear satisfied with the likelihood of being seen within the next week.  Orthostatic vital signs were checked showing no change in blood pressure, borderline significant change in heart rate.  She is still thought to be safe for discharge with instructions to increase pantoprazole to twice a day and she will be given ambulatory referral to gastroenterology here for second opinion.  Final Clinical Impression(s) / ED Diagnoses Final diagnoses:  Upper gastrointestinal bleeding  Hiatal hernia  Cysts of both ovaries     Rx / DC Orders ED Discharge Orders          Ordered    Ambulatory referral to Gastroenterology        07/07/21 1610             Delora Fuel, MD 96[birth year] [phone]

## 2021-07-07 NOTE — Telephone Encounter (Signed)
Yes, I sent Lori Bentley a message about this patient but haven't heard back from her yet. The patient went to the ED last night seeking a GI consult that was nonurgent, she was discharged home as this is an outpatient workup. She does have a GI provider, Dr. Vicente Males, who is out of the office for a few weeks. She called their office yesterday and was told she could not be seen until February but I don't think Dr. Vicente Males saw it, not sure if one of his partners can see her sooner and their practice should be able to do that. Otherwise I told them if she wanted a second opinion we could see her here. Can be seen by any MD or APP in first opening if anything in the next few weeks. Thanks

## 2021-07-08 LAB — HEPATIC FUNCTION PANEL
ALT: 18 U/L (ref 0–44)
AST: 20 U/L (ref 15–41)
Albumin: 4.1 g/dL (ref 3.5–5.0)
Alkaline Phosphatase: 51 U/L (ref 38–126)
Bilirubin, Direct: <0.1 mg/dL (ref 0.0–0.2)
Total Bilirubin: 0.5 mg/dL (ref 0.3–1.2)
Total Protein: 7.4 g/dL (ref 6.5–8.1)

## 2021-07-08 LAB — LIPASE, BLOOD: Lipase: 53 U/L — ABNORMAL HIGH (ref 11–51)

## 2021-07-10 NOTE — Telephone Encounter (Signed)
She could see Dr. Allen Norris or Dr. Marius Ditch. Whoever has an opening soon.

## 2021-07-20 ENCOUNTER — Emergency Department: Payer: Medicaid Other

## 2021-07-20 ENCOUNTER — Encounter: Payer: Self-pay | Admitting: Emergency Medicine

## 2021-07-20 ENCOUNTER — Other Ambulatory Visit: Payer: Self-pay

## 2021-07-20 ENCOUNTER — Emergency Department
Admission: EM | Admit: 2021-07-20 | Discharge: 2021-07-21 | Disposition: A | Discharge status: Home / Self Care | Payer: Medicaid Other | Attending: Student in an Organized Health Care Education/Training Program | Admitting: Student in an Organized Health Care Education/Training Program

## 2021-07-20 DIAGNOSIS — R112 Nausea with vomiting, unspecified: Secondary | ICD-10-CM

## 2021-07-20 DIAGNOSIS — Z87891 Personal history of nicotine dependence: Secondary | ICD-10-CM | POA: Diagnosis not present

## 2021-07-20 DIAGNOSIS — E119 Type 2 diabetes mellitus without complications: Secondary | ICD-10-CM | POA: Diagnosis not present

## 2021-07-20 DIAGNOSIS — E876 Hypokalemia: Secondary | ICD-10-CM | POA: Diagnosis not present

## 2021-07-20 DIAGNOSIS — Z20822 Contact with and (suspected) exposure to covid-19: Secondary | ICD-10-CM | POA: Diagnosis not present

## 2021-07-20 DIAGNOSIS — R42 Dizziness and giddiness: Secondary | ICD-10-CM | POA: Insufficient documentation

## 2021-07-20 DIAGNOSIS — J45909 Unspecified asthma, uncomplicated: Secondary | ICD-10-CM | POA: Insufficient documentation

## 2021-07-20 DIAGNOSIS — Z7951 Long term (current) use of inhaled steroids: Secondary | ICD-10-CM | POA: Insufficient documentation

## 2021-07-20 DIAGNOSIS — Z91010 Allergy to peanuts: Secondary | ICD-10-CM | POA: Diagnosis not present

## 2021-07-20 LAB — COMPREHENSIVE METABOLIC PANEL
ALT: 15 U/L (ref 0–44)
AST: 21 U/L (ref 15–41)
Albumin: 3.3 g/dL — ABNORMAL LOW (ref 3.5–5.0)
Alkaline Phosphatase: 56 U/L (ref 38–126)
Anion gap: 4 — ABNORMAL LOW (ref 5–15)
BUN: 9 mg/dL (ref 6–20)
CO2: 20 mmol/L — ABNORMAL LOW (ref 22–32)
Calcium: 7.5 mg/dL — ABNORMAL LOW (ref 8.9–10.3)
Chloride: 114 mmol/L — ABNORMAL HIGH (ref 98–111)
Creatinine, Ser: 0.72 mg/dL (ref 0.44–1.00)
GFR, Estimated: >60 mL/min (ref >60)
Glucose, Bld: 111 mg/dL — ABNORMAL HIGH (ref 70–99)
Potassium: 2.5 mmol/L — CL (ref 3.5–5.1)
Sodium: 138 mmol/L (ref 135–145)
Total Bilirubin: 0.3 mg/dL (ref 0.3–1.2)
Total Protein: 6.4 g/dL — ABNORMAL LOW (ref 6.5–8.1)

## 2021-07-20 LAB — URINALYSIS, ROUTINE W REFLEX MICROSCOPIC
Bilirubin Urine: NEGATIVE
Glucose, UA: NEGATIVE mg/dL
Leukocytes,Ua: NEGATIVE
Nitrite: NEGATIVE
Protein, ur: 30 mg/dL — AB
Specific Gravity, Urine: >1.03 — ABNORMAL HIGH (ref 1.005–1.030)
pH: 5.5 (ref 5.0–8.0)

## 2021-07-20 LAB — CBC
HCT: 39.5 % (ref 36.0–46.0)
Hemoglobin: 12.8 g/dL (ref 12.0–15.0)
MCH: 26.4 pg (ref 26.0–34.0)
MCHC: 32.4 g/dL (ref 30.0–36.0)
MCV: 81.6 fL (ref 80.0–100.0)
Platelets: 434 10*3/uL — ABNORMAL HIGH (ref 150–400)
RBC: 4.84 MIL/uL (ref 3.87–5.11)
RDW: 19.2 % — ABNORMAL HIGH (ref 11.5–15.5)
WBC: 18.1 10*3/uL — ABNORMAL HIGH (ref 4.0–10.5)
nRBC: 0 % (ref 0.0–0.2)

## 2021-07-20 LAB — MAGNESIUM: Magnesium: 1.7 mg/dL (ref 1.7–2.4)

## 2021-07-20 LAB — LIPASE, BLOOD: Lipase: 58 U/L — ABNORMAL HIGH (ref 11–51)

## 2021-07-20 LAB — URINALYSIS, MICROSCOPIC (REFLEX)

## 2021-07-20 LAB — RESP PANEL BY RT-PCR (FLU A&B, COVID) ARPGX2
Influenza A by PCR: NEGATIVE
Influenza B by PCR: NEGATIVE
SARS Coronavirus 2 by RT PCR: NEGATIVE

## 2021-07-20 LAB — PREGNANCY, URINE: Preg Test, Ur: NEGATIVE

## 2021-07-20 MED ORDER — METOCLOPRAMIDE HCL 10 MG PO TABS: 10.0000 mg | ORAL_TABLET | Freq: Three times a day (TID) | ORAL | 0 refills | Status: DC

## 2021-07-20 MED ORDER — POTASSIUM CHLORIDE CRYS ER 20 MEQ PO TBCR
40.0000 meq | EXTENDED_RELEASE_TABLET | Freq: Once | ORAL | Status: AC
  Administered 2021-07-20: 21:00:00 40 meq via ORAL
  Filled 2021-07-20: qty 2

## 2021-07-20 MED ORDER — PANTOPRAZOLE SODIUM 40 MG IV SOLR
40.0000 mg | Freq: Once | INTRAVENOUS | Status: AC
  Administered 2021-07-20: 20:00:00 40 mg via INTRAVENOUS
  Filled 2021-07-20: qty 40

## 2021-07-20 MED ORDER — ONDANSETRON HCL 4 MG/2ML IJ SOLN
4.0000 mg | Freq: Once | INTRAMUSCULAR | Status: AC | PRN
  Administered 2021-07-20: 20:00:00 4 mg via INTRAVENOUS
  Filled 2021-07-20: qty 2

## 2021-07-20 MED ORDER — METOCLOPRAMIDE HCL 5 MG/ML IJ SOLN
10.0000 mg | Freq: Once | INTRAMUSCULAR | Status: AC
  Administered 2021-07-20: 20:00:00 10 mg via INTRAVENOUS
  Filled 2021-07-20: qty 2

## 2021-07-20 MED ORDER — MAGNESIUM SULFATE 2 GM/50ML IV SOLN
2.0000 g | Freq: Once | INTRAVENOUS | Status: AC
  Administered 2021-07-20: 21:00:00 2 g via INTRAVENOUS
  Filled 2021-07-20: qty 50

## 2021-07-20 MED ORDER — LACTATED RINGERS IV BOLUS
1000.0000 mL | Freq: Once | INTRAVENOUS | Status: AC
  Administered 2021-07-20: 20:00:00 1000 mL via INTRAVENOUS

## 2021-07-20 MED ORDER — POTASSIUM CHLORIDE CRYS ER 20 MEQ PO TBCR: 20.0000 meq | EXTENDED_RELEASE_TABLET | Freq: Every day | ORAL | 0 refills | Status: AC

## 2021-07-20 MED ORDER — POTASSIUM CHLORIDE 10 MEQ/100ML IV SOLN
10.0000 meq | INTRAVENOUS | Status: AC
  Administered 2021-07-20 (×4): 10 meq via INTRAVENOUS
  Filled 2021-07-20 (×4): qty 100

## 2021-07-20 NOTE — ED Notes (Signed)
First nurse note  presents with some abd pain  positive n/v   hx of GI bleed in past  weak

## 2021-07-20 NOTE — ED Triage Notes (Signed)
Pt via [facility] from home c/o nausea/vomiting, dizziness, weakness x 1 day. Estimates 10 episodes of vomiting since symptom onset. Pt has not taken her daily meds today, has poor PO intake, and is having difficulty keeping anything down.

## 2021-07-20 NOTE — ED Provider Notes (Signed)
[facility] Emergency Department Provider Note    Event Date/Time   First MD Initiated Contact with Patient 07/20/21 1951     (approximate)  I have reviewed the triage vital signs and the nursing notes.   HISTORY  Chief Complaint Emesis    HPI Lori Bentley is a 35 y.o. female with a history of bipolar depression as well as GERD and hiatal hernia presents to the ER for evaluation of multiple episodes of nonbilious nonbloody emesis today.  States that she does occasionally use marijuana due to use last night.  Woke up with nausea vomiting.  Having trouble keeping any of her medications down.  Has been feeling stressed out lately.  Denies any fevers or chills.  Has not been able to follow-up with GI.  Denies any lower abdominal pain.  No dysuria.  Past Medical History:  Diagnosis Date   Anxiety    Asthma    WELL CONTROLLED   Bipolar affective disorder, currently manic, mild (HCC)    Chronic anemia    Depression    Diabetes ([place]) 12/2017   patient denies diabetes   GERD (gastroesophageal reflux disease)    History of blood transfusion 2018   GI BLEED   History of hiatal hernia    Family History  Problem Relation Age of Onset   Anxiety disorder Mother    Asthma Mother    Fibroids Mother    GER disease Father    Past Surgical History:  Procedure Laterality Date   COLONOSCOPY WITH PROPOFOL N/A 09/18/2019   Procedure: COLONOSCOPY WITH PROPOFOL;  Surgeon: [name], MD;  Location: [facility] ENDOSCOPY;  Service: Gastroenterology;  Laterality: N/A;   ESOPHAGOGASTRODUODENOSCOPY (EGD) WITH PROPOFOL N/A 05/18/2016   Procedure: ESOPHAGOGASTRODUODENOSCOPY (EGD) WITH PROPOFOL;  Surgeon: Manya Silvas, MD;  Location: [facility] ENDOSCOPY;  Service: Endoscopy;  Laterality: N/A;   ESOPHAGOGASTRODUODENOSCOPY (EGD) WITH PROPOFOL N/A 09/18/2019   Procedure: ESOPHAGOGASTRODUODENOSCOPY (EGD) WITH PROPOFOL;  Surgeon: [name], MD;  Location: [facility] ENDOSCOPY;   Service: Gastroenterology;  Laterality: N/A;   GIVENS CAPSULE STUDY N/A 11/09/2019   Procedure: GIVENS CAPSULE STUDY;  Surgeon: [name], MD;  Location: [facility] ENDOSCOPY;  Service: Gastroenterology;  Laterality: N/A;   MYOMECTOMY N/A 01/02/2018   Procedure: MYOMECTOMY;  Surgeon: Gae Dry, MD;  Location: [facility] ORS;  Service: Gynecology;  Laterality: N/A;   TUBAL LIGATION  2012   Patient Active Problem List   Diagnosis Date Noted   GIB (gastrointestinal bleeding) 03/27/2019   GI bleed 03/27/2019   Fibroids 12/09/2017   IDA (iron deficiency anemia) 05/16/2016   New onset of headaches 05/16/2016   Menorrhagia with regular cycle 05/16/2016   Dehydration 05/16/2016   Nausea with vomiting 05/16/2016   Blood loss anemia 04/23/2016   Hematemesis 04/23/2016   Melena 04/23/2016   Diabetes ([place]) 04/23/2016   GERD (gastroesophageal reflux disease) 04/23/2016      Prior to Admission medications   Medication Sig Start Date End Date Taking? Authorizing Provider  metoCLOPramide (REGLAN) 10 MG tablet Take 1 tablet (10 mg total) by mouth 3 (three) times daily with meals. 07/20/21 07/20/22 Yes Merlyn Lot, MD  potassium chloride SA (KLOR-CON M) 20 MEQ tablet Take 1 tablet (20 mEq total) by mouth daily for 5 days. 07/20/21 07/25/21 Yes Merlyn Lot, MD  albuterol (PROVENTIL HFA;VENTOLIN HFA) 108 (90 Base) MCG/ACT inhaler Inhale 2 puffs into the lungs every 6 (six) hours as needed for wheezing or shortness of breath. 11/29/17   Darel Hong, MD  ferrous sulfate 325 (65 FE) MG tablet Take 1 tablet (325 mg total) by mouth daily. Patient not taking: Reported on 07/06/2021 02/06/21 05/16/21  Merlyn Lot, MD  FLOVENT HFA 110 MCG/ACT inhaler Inhale 2 puffs into the lungs in the morning and at bedtime. 01/11/21   [provider]  hydrALAZINE (APRESOLINE) 25 MG tablet Take by mouth. Patient not taking: Reported on 05/16/2021    [provider]  loratadine (CLARITIN) 10 MG  tablet Take 1 tablet by mouth daily. Patient not taking: Reported on 05/16/2021 07/05/14   [provider]  metFORMIN (GLUCOPHAGE) 500 MG tablet Take 1 tablet by mouth daily with breakfast. Patient not taking: Reported on 05/16/2021 07/02/14   [provider]  mupirocin cream (BACTROBAN) 2 % Apply to affected area 3 times daily 07/06/21 07/06/22  Naaman Plummer, MD  ondansetron (ZOFRAN) 4 MG tablet Take 1 tablet (4 mg total) by mouth daily as needed for nausea or vomiting. Patient not taking: Reported on 07/06/2021 07/06/21 07/06/22  Naaman Plummer, MD  pantoprazole (PROTONIX) 40 MG tablet TAKE 1 TABLET BY MOUTH DAILY 04/11/20   [name], MD    Allergies Nsaids, Oxycodone, Peanut-containing drug products, Percocet [oxycodone-acetaminophen], and Phenylephrine-guaifenesin    Social History Social History   Tobacco Use   Smoking status: Former    Packs/day: 0.25    Years: 1.00    Pack years: 0.25    Types: Cigarettes    Quit date: 10/24/2020    Years since quitting: 0.7   Smokeless tobacco: Never  Vaping Use   Vaping Use: Never used  Substance Use Topics   Alcohol use: Not Currently   Drug use: Yes    Types: Marijuana    Comment: occasionally     Review of Systems Patient denies headaches, rhinorrhea, blurry vision, numbness, shortness of breath, chest pain, edema, cough, abdominal pain, nausea, vomiting, diarrhea, dysuria, fevers, rashes or hallucinations unless otherwise stated above in HPI. ____________________________________________   PHYSICAL EXAM:  VITAL SIGNS: Vitals:   07/20/21 2100 07/20/21 2209  BP: 108/78 119/76  Pulse: 81 82  Resp: 19 18  Temp:    SpO2: 100% 100%    Constitutional: Alert and oriented.  Eyes: Conjunctivae are normal.  Head: Atraumatic. Nose: No congestion/rhinnorhea. Mouth/Throat: Mucous membranes are moist.   Neck: No stridor. Painless ROM.  Cardiovascular: Normal rate, regular rhythm. Grossly normal heart sounds.   Good peripheral circulation. Respiratory: Normal respiratory effort.  No retractions. Lungs CTAB. Gastrointestinal: Soft and nontender. No distention. No abdominal bruits. No CVA tenderness. Genitourinary:  Musculoskeletal: No lower extremity tenderness nor edema.  No joint effusions. Neurologic:  Normal speech and language. No gross focal neurologic deficits are appreciated. No facial droop Skin:  Skin is warm, dry and intact. No rash noted. Psychiatric: Mood and affect are normal. Speech and behavior are normal.  ____________________________________________   LABS (all labs ordered are listed, but only abnormal results are displayed)  Results for orders placed or performed during the hospital encounter of 07/20/21 (from the past 24 hour(s))  Lipase, blood     Status: Abnormal   Collection Time: 07/20/21  2:10 PM  Result Value Ref Range   Lipase 58 (H) 11 - 51 U/L  Comprehensive metabolic panel     Status: Abnormal   Collection Time: 07/20/21  2:10 PM  Result Value Ref Range   Sodium 138 135 - 145 mmol/L   Potassium 2.5 (LL) 3.5 - 5.1 mmol/L   Chloride 114 (H) 98 - 111  mmol/L   CO2 20 (L) 22 - 32 mmol/L   Glucose, Bld 111 (H) 70 - 99 mg/dL   BUN 9 6 - 20 mg/dL   Creatinine, Ser 0.72 0.44 - 1.00 mg/dL   Calcium 7.5 (L) 8.9 - 10.3 mg/dL   Total Protein 6.4 (L) 6.5 - 8.1 g/dL   Albumin 3.3 (L) 3.5 - 5.0 g/dL   AST 21 15 - 41 U/L   ALT 15 0 - 44 U/L   Alkaline Phosphatase 56 38 - 126 U/L   Total Bilirubin 0.3 0.3 - 1.2 mg/dL   GFR, Estimated >60 >60 mL/min   Anion gap 4 (L) 5 - 15  CBC     Status: Abnormal   Collection Time: 07/20/21  2:10 PM  Result Value Ref Range   WBC 18.1 (H) 4.0 - 10.5 K/uL   RBC 4.84 3.87 - 5.11 MIL/uL   Hemoglobin 12.8 12.0 - 15.0 g/dL   HCT 39.5 36.0 - 46.0 %   MCV 81.6 80.0 - 100.0 fL   MCH 26.4 26.0 - 34.0 pg   MCHC 32.4 30.0 - 36.0 g/dL   RDW 19.2 (H) 11.5 - 15.5 %   Platelets 434 (H) 150 - 400 K/uL   nRBC 0.0 0.0 - 0.2 %  Urinalysis,  Routine w reflex microscopic     Status: Abnormal   Collection Time: 07/20/21  2:10 PM  Result Value Ref Range   Color, Urine YELLOW YELLOW   APPearance CLEAR CLEAR   Specific Gravity, Urine >1.030 (H) 1.005 - 1.030   pH 5.5 5.0 - 8.0   Glucose, UA NEGATIVE NEGATIVE mg/dL   Hgb urine dipstick LARGE (A) NEGATIVE   Bilirubin Urine NEGATIVE NEGATIVE   Ketones, ur TRACE (A) NEGATIVE mg/dL   Protein, ur 30 (A) NEGATIVE mg/dL   Nitrite NEGATIVE NEGATIVE   Leukocytes,Ua NEGATIVE NEGATIVE  Magnesium     Status: None   Collection Time: 07/20/21  2:10 PM  Result Value Ref Range   Magnesium 1.7 1.7 - 2.4 mg/dL  Pregnancy, urine     Status: None   Collection Time: 07/20/21  2:10 PM  Result Value Ref Range   Preg Test, Ur NEGATIVE NEGATIVE  Urinalysis, Microscopic (reflex)     Status: Abnormal   Collection Time: 07/20/21  2:10 PM  Result Value Ref Range   RBC / HPF 0-5 0 - 5 RBC/hpf   WBC, UA 0-5 0 - 5 WBC/hpf   Bacteria, UA RARE (A) NONE SEEN   Squamous Epithelial / LPF 6-10 0 - 5   Mucus PRESENT    Ca Oxalate Crys, UA PRESENT   Resp Panel by RT-PCR (Flu A&B, Covid) Nasopharyngeal Swab     Status: None   Collection Time: 07/20/21  8:13 PM   Specimen: Nasopharyngeal Swab; Nasopharyngeal(NP) swabs in vial transport medium  Result Value Ref Range   SARS Coronavirus 2 by RT PCR NEGATIVE NEGATIVE   Influenza A by PCR NEGATIVE NEGATIVE   Influenza B by PCR NEGATIVE NEGATIVE   ____________________________________________  EKG My review and personal interpretation at Time: 14:00   Indication: n/v  Rate: 95  Rhythm: sinus Axis: normal Other: nonspecific st abn, no stemi ____________________________________________  RADIOLOGY  I personally reviewed all radiographic images ordered to evaluate for the above acute complaints and reviewed radiology reports and findings.  These findings were personally discussed with the patient.  Please see medical record for radiology  report.  ____________________________________________   PROCEDURES  Procedure(s) performed:  Procedures  Critical Care performed: no ____________________________________________   INITIAL IMPRESSION / ASSESSMENT AND PLAN / ED COURSE  Pertinent labs & imaging results that were available during my care of the patient were reviewed by me and considered in my medical decision making (see chart for details).   DDX: Enteritis, gastritis, cyclic vomiting, SBO, biliary pathology, dehydration, electrolyte abnormality  Lori Bentley is a 35 y.o. who presents to the ED with vertigo nausea vomiting today.  Blood work shows evidence of hypokalemia she is nontoxic-appearing.  Does have a history of her bowels and vomiting.  Do not feel that CT imaging clinically indicated.  She not complaining of any pain.  Does admit to using marijuana but does not think that that is causing her vomiting.  We will give IV antiemetics as well as IV fluids as well as replete potassium.  Patient will be placed on cardiac monitor.  Clinical Course as of 07/20/21 2304  Thu Jul 20, 2021  2241 Patient reassessed.  She is eating [employer].  No emesis.  States that she would like to be discharged.  Discussed my concern for her low potassium but patient agreeable to plan for staying for completion of her runs of IV potassium and recheck BMP. [PR]    Clinical Course User Index [PR] Merlyn Lot, MD   Patient signed out to oncoming physician pending repeat BMP.   The patient was evaluated in Emergency Department today for the symptoms described in the history of present illness. He/she was evaluated in the context of the global COVID-19 pandemic, which necessitated consideration that the patient might be at risk for infection with the SARS-CoV-2 virus that causes COVID-19. Institutional protocols and algorithms that pertain to the evaluation of patients at risk for COVID-19 are in a state of rapid change  based on information released by regulatory bodies including the CDC and federal and state organizations. These policies and algorithms were followed during the patient's care in the ED.  As part of my medical decision making, I reviewed the following data within the [place] notes reviewed and incorporated, Labs reviewed, notes from prior ED visits and [place] Controlled Substance Database   ____________________________________________   FINAL CLINICAL IMPRESSION(S) / ED DIAGNOSES  Final diagnoses:  Nausea and vomiting, unspecified vomiting type  Hypokalemia      NEW MEDICATIONS STARTED DURING THIS VISIT:  New Prescriptions   METOCLOPRAMIDE (REGLAN) 10 MG TABLET    Take 1 tablet (10 mg total) by mouth 3 (three) times daily with meals.   POTASSIUM CHLORIDE SA (KLOR-CON M) 20 MEQ TABLET    Take 1 tablet (20 mEq total) by mouth daily for 5 days.     Note:  This document was prepared using Dragon voice recognition software and may include unintentional dictation errors.    Merlyn Lot, MD 07/20/21 [phone]

## 2021-07-21 LAB — BASIC METABOLIC PANEL
Anion gap: 4 — ABNORMAL LOW (ref 5–15)
BUN: 7 mg/dL (ref 6–20)
CO2: 27 mmol/L (ref 22–32)
Calcium: 8.1 mg/dL — ABNORMAL LOW (ref 8.9–10.3)
Chloride: 106 mmol/L (ref 98–111)
Creatinine, Ser: 0.61 mg/dL (ref 0.44–1.00)
GFR, Estimated: >60 mL/min (ref >60)
Glucose, Bld: 118 mg/dL — ABNORMAL HIGH (ref 70–99)
Potassium: 3.6 mmol/L (ref 3.5–5.1)
Sodium: 137 mmol/L (ref 135–145)

## 2021-07-21 NOTE — ED Provider Notes (Signed)
Procedures  Clinical Course as of 07/21/21 0126  Thu Jul 20, 2021  2241 Patient reassessed.  She is eating [employer].  No emesis.  States that she would like to be discharged.  Discussed my concern for her low potassium but patient agreeable to plan for staying for completion of her runs of IV potassium and recheck BMP. [PR]    Clinical Course User Index [PR] Merlyn Lot, MD    ----------------------------------------- 1:26 AM on 07/21/2021 -----------------------------------------  Repeat BMP shows potassium improved to 3.6, now normalized.  Stable for discharge.    Carrie Mew, MD 07/21/21 [phone]

## 2021-07-28 ENCOUNTER — Other Ambulatory Visit: Payer: Self-pay

## 2021-07-31 ENCOUNTER — Emergency Department
Admission: EM | Admit: 2021-07-31 | Discharge: 2021-07-31 | Disposition: A | Discharge status: Home / Self Care | Payer: Medicaid Other | Attending: Emergency Medicine | Admitting: Emergency Medicine

## 2021-07-31 ENCOUNTER — Other Ambulatory Visit: Payer: Self-pay

## 2021-07-31 ENCOUNTER — Encounter: Payer: Self-pay | Admitting: Emergency Medicine

## 2021-07-31 DIAGNOSIS — E876 Hypokalemia: Secondary | ICD-10-CM | POA: Insufficient documentation

## 2021-07-31 DIAGNOSIS — Z20822 Contact with and (suspected) exposure to covid-19: Secondary | ICD-10-CM | POA: Insufficient documentation

## 2021-07-31 DIAGNOSIS — E119 Type 2 diabetes mellitus without complications: Secondary | ICD-10-CM | POA: Diagnosis not present

## 2021-07-31 DIAGNOSIS — R55 Syncope and collapse: Secondary | ICD-10-CM | POA: Insufficient documentation

## 2021-07-31 DIAGNOSIS — R109 Unspecified abdominal pain: Secondary | ICD-10-CM | POA: Insufficient documentation

## 2021-07-31 LAB — COMPREHENSIVE METABOLIC PANEL
ALT: 36 U/L (ref 0–44)
AST: 45 U/L — ABNORMAL HIGH (ref 15–41)
Albumin: 3.8 g/dL (ref 3.5–5.0)
Alkaline Phosphatase: 82 U/L (ref 38–126)
Anion gap: 8 (ref 5–15)
BUN: 7 mg/dL (ref 6–20)
CO2: 25 mmol/L (ref 22–32)
Calcium: 8.3 mg/dL — ABNORMAL LOW (ref 8.9–10.3)
Chloride: 103 mmol/L (ref 98–111)
Creatinine, Ser: 0.86 mg/dL (ref 0.44–1.00)
GFR, Estimated: >60 mL/min (ref >60)
Glucose, Bld: 118 mg/dL — ABNORMAL HIGH (ref 70–99)
Potassium: 3.5 mmol/L (ref 3.5–5.1)
Sodium: 136 mmol/L (ref 135–145)
Total Bilirubin: 0.5 mg/dL (ref 0.3–1.2)
Total Protein: 7.7 g/dL (ref 6.5–8.1)

## 2021-07-31 LAB — CBC
HCT: 37.8 % (ref 36.0–46.0)
Hemoglobin: 12.3 g/dL (ref 12.0–15.0)
MCH: 27.2 pg (ref 26.0–34.0)
MCHC: 32.5 g/dL (ref 30.0–36.0)
MCV: 83.6 fL (ref 80.0–100.0)
Platelets: 339 10*3/uL (ref 150–400)
RBC: 4.52 MIL/uL (ref 3.87–5.11)
RDW: 19.1 % — ABNORMAL HIGH (ref 11.5–15.5)
WBC: 12.1 10*3/uL — ABNORMAL HIGH (ref 4.0–10.5)
nRBC: 0 % (ref 0.0–0.2)

## 2021-07-31 LAB — URINALYSIS, ROUTINE W REFLEX MICROSCOPIC
Bilirubin Urine: NEGATIVE
Glucose, UA: NEGATIVE mg/dL
Hgb urine dipstick: NEGATIVE
Ketones, ur: NEGATIVE mg/dL
Leukocytes,Ua: NEGATIVE
Nitrite: NEGATIVE
Protein, ur: NEGATIVE mg/dL
Specific Gravity, Urine: 1.027 (ref 1.005–1.030)
pH: 5 (ref 5.0–8.0)

## 2021-07-31 LAB — RESP PANEL BY RT-PCR (FLU A&B, COVID) ARPGX2
Influenza A by PCR: NEGATIVE
Influenza B by PCR: NEGATIVE
SARS Coronavirus 2 by RT PCR: NEGATIVE

## 2021-07-31 LAB — LIPASE, BLOOD: Lipase: 39 U/L (ref 11–51)

## 2021-07-31 LAB — TROPONIN I (HIGH SENSITIVITY)
Troponin I (High Sensitivity): 3 ng/L (ref <18)
Troponin I (High Sensitivity): 5 ng/L (ref <18)

## 2021-07-31 LAB — POC URINE PREG, ED: Preg Test, Ur: NEGATIVE

## 2021-07-31 NOTE — ED Triage Notes (Signed)
Pt c/o dizziness, tachycardia, fatigue and 1 syncopal episode this AM when pt stood and fell backwards onto bed. Pt sts fatigue started on 1/1 but tachycardia and dizziness upon waking this AM. Pt report she was seen and treated in ED for Hypokalemia and sts she feel the same but no N/V/D. Pt also c/o lower abdominal pain as well.

## 2021-07-31 NOTE — ED Provider Notes (Deleted)
.  armced   Harvest Dark, MD 07/31/21 [phone]

## 2021-07-31 NOTE — ED Notes (Signed)
Says she had racing heart since yesterday .  Feeling bad--stomach hurts and just hurts all over.  Says her K was down and she took rx before christmas.

## 2021-07-31 NOTE — ED Provider Notes (Signed)
[facility] Provider Note    Event Date/Time   First MD Initiated Contact with Patient 07/31/21 [phone]     (approximate)   History   Abdominal Pain and Tachycardia   HPI  Lori Bentley is a 36 y.o. female  with a past medical history of anemia, diabetes, bipolar, gastric reflux, presents to the emergency department for a syncopal episode and complaints of pain all over.  According to the patient last night she felt her heart racing and had a near syncopal event where she fell onto the bed.  Patient states that the event was very brief seconds.  States she has been experiencing pain all over her body for the past few days.  Denies any specific or focal area of pain.  Described as a aching type pain throughout her body.  No fever cough or congestion.  Denies any modifying factors.  Patient states she has been nauseated but denies any vomiting or diarrhea.  Patient states mild abdominal discomfort but this is largely chronic in fact patient has a follow-up with gastroenterology tomorrow.       Physical Exam   Triage Vital Signs: ED Triage Vitals  Enc Vitals Group     BP 07/31/21 0225 (!) 123/94     Pulse Rate 07/31/21 0225 99     Resp 07/31/21 0225 20     Temp 07/31/21 0225 98.1 F (36.7 C)     Temp Source 07/31/21 0225 Oral     SpO2 07/31/21 0225 98 %     Weight --      Height --      Head Circumference --      Peak Flow --      Pain Score 07/31/21 0226 9     Pain Loc --      Pain Edu? --      Excl. in [place]? --     Most recent vital signs: Vitals:   07/31/21 0549 07/31/21 0728  BP: 119/70 (!) 126/98  Pulse: 93 96  Resp: 17 14  Temp:  98.6 F (37 C)  SpO2: 99% 99%    General: Awake, no distress.  Calm cooperative.   CV:  Good peripheral perfusion.  Regular rate and rhythm, around 80 bpm.  No obvious murmur. Resp:  Normal effort.  Equal bilaterally.  Normal respiratory rate. Abd:  No distention.  Soft and nontender.    ED  Results / Procedures / Treatments   Labs (all labs ordered are listed, but only abnormal results are displayed) Labs Reviewed  COMPREHENSIVE METABOLIC PANEL - Abnormal; Notable for the following components:      Result Value   Glucose, Bld 118 (*)    Calcium 8.3 (*)    AST 45 (*)    All other components within normal limits  CBC - Abnormal; Notable for the following components:   WBC 12.1 (*)    RDW 19.1 (*)    All other components within normal limits  URINALYSIS, ROUTINE W REFLEX MICROSCOPIC - Abnormal; Notable for the following components:   Color, Urine YELLOW (*)    APPearance CLOUDY (*)    All other components within normal limits  LIPASE, BLOOD  POC URINE PREG, ED  TROPONIN I (HIGH SENSITIVITY)  TROPONIN I (HIGH SENSITIVITY)     EKG  EKG viewed and interpreted by myself shows normal sinus rhythm at 94 bpm with a narrow QRS, normal axis, normal intervals, no concerning ST changes.   PROCEDURES:  Critical Care performed: No  Procedures   MEDICATIONS ORDERED IN ED: Medications - No data to display   IMPRESSION / MDM / [place] / ED COURSE  I reviewed the triage vital signs and the nursing notes.                              Differential diagnosis includes, but is not limited to, viral illness, dehydration, hypovolemia, electrolyte or metabolic abnormality, hypokalemia.  Overall the patient appears well, reassuring vitals, reassuring physical exam.  Lab work has resulted large within normal limits as well.  No concerning findings on CBC CMP or urinalysis.  Patient states a history of syncopal episodes in the past, states she last had a syncopal episode approximately 2 weeks ago.  No chest pain reported at any point.  Patient did recently have an emergency department visit and found to be hypokalemic however potassium is reassuring on today's visit.  I discussed with the patient to obtain plenty of rest today supportive care at home including increasing  oral hydration and avoiding physical exertion.  Patient states she has a follow-up appoint with gastroenterology tomorrow.  I also asked the patient to call her PCP regarding today's ER visit.  Patient agreeable to plan of care.       FINAL CLINICAL IMPRESSION(S) / ED DIAGNOSES  Syncope     Note:  This document was prepared using Dragon voice recognition software and may include unintentional dictation errors.    Harvest Dark, MD 07/31/21 [phone]

## 2021-07-31 NOTE — Progress Notes (Deleted)
07/31/2021 Lori Bentley [id number] 1986-05-07   CHIEF COMPLAINT:   HISTORY OF PRESENT ILLNESS: Lori Bentley a 36 year old female with a past medical history anxiety, depression, bipolar disorder, asthma GERD, hiatal hernia and IDA.  History of menorrhagia in 01/16/2018 for which she underwent a myomectomy of uterine fibroids. She was admitted to the hospital in 03/19/2019 with near syncope secondary to anemia hemoglobin was noted to be 5.8 g. She was transfused 2 units of pRBCs. Noted to have severe iron deficiency and received  IV iron by hematology. She declined endoscopic evaluation during this admission. She underwent an EGD and colonoscopy as an outpatient by  Dr. [name].   Referred by Dr. Delora Fuel for further evaluation regarding anemia secondary to GI bleed.  She presented to [facility] ED on 07/20/2021 with nausea and vomiting, nonbloody emesis. Her potasium level was 2.5.  She received potassium p.o. and Reglan and and her symptoms stabilized and she was discharged home.  She presented back to the ED 07/31/2021 with complaints of dizziness, fatigue with a near syncopal episode earlier in the morning.  Labs in the ED showed a normal potassium level 3.5.  AST was mildly elevated at 45.  Normal AST, total bili and alk phos levels.  WBC 12.1.  Hemoglobin 12.3.  She was discharged home with instructions to proceed with her GI appointment today as previously scheduled.  CTAP 07/07/2021: 1. No acute intra-abdominal process. 2. Large hiatal hernia. 3. Splenic cyst. 4. Bilateral ovarian cysts, slightly decreased in size on the right and new cyst on the left. Ultrasound is suggested for further evaluation on follow-up. IUD present in the uterus.   CBC Latest Ref Rng & Units 07/31/2021 07/20/2021 07/07/2021  WBC 4.0 - 10.5 K/uL 12.1(H) 18.1(H) 12.4(H)  Hemoglobin 12.0 - 15.0 g/dL 12.3 12.8 12.2  Hematocrit 36.0 - 46.0 % 37.8 39.5 37.9  Platelets  150 - 400 K/uL 339 434(H) 382    CMP Latest Ref Rng & Units 07/31/2021 07/21/2021 07/20/2021  Glucose 70 - 99 mg/dL 118(H) 118(H) 111(H)  BUN 6 - 20 mg/dL 7 7 9   Creatinine 0.44 - 1.00 mg/dL 0.86 0.61 0.72  Sodium 135 - 145 mmol/L 136 137 138  Potassium 3.5 - 5.1 mmol/L 3.5 3.6 2.5(LL)  Chloride 98 - 111 mmol/L 103 106 114(H)  CO2 22 - 32 mmol/L 25 27 20(L)  Calcium 8.9 - 10.3 mg/dL 8.3(L) 8.1(L) 7.5(L)  Total Protein 6.5 - 8.1 g/dL 7.7 - 6.4(L)  Total Bilirubin 0.3 - 1.2 mg/dL 0.5 - 0.3  Alkaline Phos 38 - 126 U/L 82 - 56  AST 15 - 41 U/L 45(H) - 21  ALT 0 - 44 U/L 36 - 15    Small bowel capsule endoscopy 11/23/2019: Large hiatal hernia otherwise was normal  EGD 09/21/2019: The esophagus was normal Large hiatal hernia  Normal examined duodenum   Colonoscopy 09/18/2019: Normal colonoscopy   EGD 04/2016: - Rings mucosa in the esophagus. Biopsied. - Large hiatal hernia. - Erythematous mucosa in the gastric body. Biopsied. - Duodenal mucosal atrophy. Biopsied.  A. DUODENUM, SECOND PORTION; COLD BIOPSY:  - UNREMARKABLE SMALL INTESTINAL MUCOSA.   B. STOMACH; COLD BIOPSY:  - UNREMARKABLE OXYNTIC MUCOSA.   C. HIATAL HERNIA INFLAMMATION; COLD BIOPSY:  - HEALING EROSIVE GASTRITIS.  - NEGATIVE FOR H. PYLORI, DYSPLASIA, AND MALIGNANCY.   D. ESOPHAGUS; COLD BIOPSY:  - SQUAMOUS MUCOSA WITH FOCAL INTRAEPITHELIAL EOSINOPHILS (UP TO 3 IN ONE  HIGH-POWER FIELD; UP  TO 20 IN 20 HIGH POWER FIELDS), SEE COMMENT.  - NEGATIVE FOR MALIGNANCY.  Past Medical History:  Diagnosis Date   Anxiety    Asthma    WELL CONTROLLED   Bipolar affective disorder, currently manic, mild (HCC)    Chronic anemia    Depression    Diabetes ([place]) 12/2017   patient denies diabetes   GERD (gastroesophageal reflux disease)    History of blood transfusion 2018   GI BLEED   History of hiatal hernia    Past Surgical History:  Procedure Laterality Date   COLONOSCOPY WITH PROPOFOL N/A 09/18/2019   Procedure:  COLONOSCOPY WITH PROPOFOL;  Surgeon: [name], MD;  Location: [facility] ENDOSCOPY;  Service: Gastroenterology;  Laterality: N/A;   ESOPHAGOGASTRODUODENOSCOPY (EGD) WITH PROPOFOL N/A 05/18/2016   Procedure: ESOPHAGOGASTRODUODENOSCOPY (EGD) WITH PROPOFOL;  Surgeon: Manya Silvas, MD;  Location: [facility] ENDOSCOPY;  Service: Endoscopy;  Laterality: N/A;   ESOPHAGOGASTRODUODENOSCOPY (EGD) WITH PROPOFOL N/A 09/18/2019   Procedure: ESOPHAGOGASTRODUODENOSCOPY (EGD) WITH PROPOFOL;  Surgeon: [name], MD;  Location: [facility] ENDOSCOPY;  Service: Gastroenterology;  Laterality: N/A;   GIVENS CAPSULE STUDY N/A 11/09/2019   Procedure: GIVENS CAPSULE STUDY;  Surgeon: [name], MD;  Location: [facility] ENDOSCOPY;  Service: Gastroenterology;  Laterality: N/A;   MYOMECTOMY N/A 01/02/2018   Procedure: MYOMECTOMY;  Surgeon: Gae Dry, MD;  Location: [facility] ORS;  Service: Gynecology;  Laterality: N/A;   TUBAL LIGATION  2012   Social History:  Family History:    reports that she quit smoking about 9 months ago. Her smoking use included cigarettes. She has a 0.25 pack-year smoking history. She has never used smokeless tobacco. She reports that she does not currently use alcohol. She reports current drug use. Drug: Marijuana. family history includes Anxiety disorder in her mother; Asthma in her mother; Fibroids in her mother; GER disease in her father.  Allergies  Allergen Reactions   Nsaids     GI BLEED-HAD BLOOD TRASNFUSION   Oxycodone Swelling   Peanut-Containing Drug Products Itching    Itching in throat   Percocet [Oxycodone-Acetaminophen] Swelling    Swelling of face and throat   Phenylephrine-Guaifenesin Other (See Comments)    Unknown reaction. Does not remember reaction      Outpatient Encounter Medications as of 08/01/2021  Medication Sig   albuterol (PROVENTIL HFA;VENTOLIN HFA) 108 (90 Base) MCG/ACT inhaler Inhale 2 puffs into the lungs every 6 (six) hours as needed for wheezing or shortness of  breath.   ferrous sulfate 325 (65 FE) MG tablet Take 1 tablet (325 mg total) by mouth daily. (Patient not taking: Reported on 07/06/2021)   FLOVENT HFA 110 MCG/ACT inhaler Inhale 2 puffs into the lungs in the morning and at bedtime.   hydrALAZINE (APRESOLINE) 25 MG tablet Take by mouth. (Patient not taking: Reported on 05/16/2021)   loratadine (CLARITIN) 10 MG tablet Take 1 tablet by mouth daily. (Patient not taking: Reported on 05/16/2021)   metoCLOPramide (REGLAN) 10 MG tablet Take 1 tablet (10 mg total) by mouth 3 (three) times daily with meals.   mupirocin cream (BACTROBAN) 2 % Apply to affected area 3 times daily   ondansetron (ZOFRAN) 4 MG tablet Take 1 tablet (4 mg total) by mouth daily as needed for nausea or vomiting. (Patient not taking: Reported on 07/06/2021)   pantoprazole (PROTONIX) 40 MG tablet TAKE 1 TABLET BY MOUTH DAILY   potassium chloride SA (KLOR-CON M) 20 MEQ tablet Take 1 tablet (20 mEq total) by mouth daily for 5 days.  No facility-administered encounter medications on file as of 08/01/2021.     REVIEW OF SYSTEMS:  Gen: Denies fever, sweats or chills. No weight loss.  CV: Denies chest pain, palpitations or edema. Resp: Denies cough, shortness of breath of hemoptysis.  GI: Denies heartburn, dysphagia, stomach or lower abdominal pain. No diarrhea or constipation.  GU : Denies urinary burning, blood in urine, increased urinary frequency or incontinence. MS: Denies joint pain, muscles aches or weakness. Derm: Denies rash, itchiness, skin lesions or unhealing ulcers. Psych: Denies depression, anxiety, memory loss, suicidal ideation and confusion. Heme: Denies bruising, bleeding. Neuro:  Denies headaches, dizziness or paresthesias. Endo:  Denies any problems with DM, thyroid or adrenal function.   PHYSICAL EXAM: There were no vitals taken for this visit. General: Well developed ... in no acute distress. Head: Normocephalic and atraumatic. Eyes:  Sclerae non-icteric,  conjunctive pink. Ears: Normal auditory acuity. Mouth: Dentition intact. No ulcers or lesions.  Neck: Supple, no lymphadenopathy or thyromegaly.  Lungs: Clear bilaterally to auscultation without wheezes, crackles or rhonchi. Heart: Regular rate and rhythm. No murmur, rub or gallop appreciated.  Abdomen: Soft, nontender, non distended. No masses. No hepatosplenomegaly. Normoactive bowel sounds x 4 quadrants.  Rectal:  Musculoskeletal: Symmetrical with no gross deformities. Skin: Warm and dry. No rash or lesions on visible extremities. Extremities: No edema. Neurological: Alert oriented x 4, no focal deficits.  Psychological:  Alert and cooperative. Normal mood and affect.  ASSESSMENT AND PLAN:  1) N/V -EGD -PPI bid  2) IDA, suspect secondary to large hiatal hernia  -Consider consult with thoracic surgeon     CC:  Freddy Finner, NP

## 2021-08-01 ENCOUNTER — Ambulatory Visit: Payer: Medicaid Other | Admitting: Nurse Practitioner

## 2021-08-07 ENCOUNTER — Encounter (HOSPITAL_COMMUNITY): Payer: Self-pay | Admitting: Radiology

## 2021-08-10 ENCOUNTER — Other Ambulatory Visit: Payer: Self-pay | Admitting: *Deleted

## 2021-08-10 DIAGNOSIS — D500 Iron deficiency anemia secondary to blood loss (chronic): Secondary | ICD-10-CM

## 2021-08-16 ENCOUNTER — Inpatient Hospital Stay: Payer: Medicaid Other | Attending: Oncology

## 2021-08-16 ENCOUNTER — Ambulatory Visit: Payer: Medicaid Other | Admitting: Nurse Practitioner

## 2021-08-17 ENCOUNTER — Ambulatory Visit (INDEPENDENT_AMBULATORY_CARE_PROVIDER_SITE_OTHER): Payer: Medicaid Other | Admitting: Gastroenterology

## 2021-08-17 ENCOUNTER — Other Ambulatory Visit: Payer: Self-pay

## 2021-08-17 DIAGNOSIS — R112 Nausea with vomiting, unspecified: Secondary | ICD-10-CM

## 2021-08-17 DIAGNOSIS — D509 Iron deficiency anemia, unspecified: Secondary | ICD-10-CM | POA: Diagnosis not present

## 2021-08-17 DIAGNOSIS — K449 Diaphragmatic hernia without obstruction or gangrene: Secondary | ICD-10-CM

## 2021-08-17 NOTE — Patient Instructions (Signed)
Please purchase a pillow wedge to help you with your acid reflux.  Food Choices for Gastroesophageal Reflux Disease, Adult When you have gastroesophageal reflux disease (GERD), the foods you eat and your eating habits are very important. Choosing the right foods can help ease your discomfort. Think about working with a food expert (dietitian) to help you make good choices. What are tips for following this plan? Reading food labels Look for foods that are low in saturated fat. Foods that may help with your symptoms include: Foods that have less than 5% of daily value (DV) of fat. Foods that have 0 grams of trans fat. Cooking Do not fry your food. Cook your food by baking, steaming, grilling, or broiling. These are all methods that do not need a lot of fat for cooking. To add flavor, try to use herbs that are low in spice and acidity. Meal planning  Choose healthy foods that are low in fat, such as: Fruits and vegetables. Whole grains. Low-fat dairy products. Lean meats, fish, and poultry. Eat small meals often instead of eating 3 large meals each day. Eat your meals slowly in a place where you are relaxed. Avoid bending over or lying down until 2-3 hours after eating. Limit high-fat foods such as fatty meats or fried foods. Limit your intake of fatty foods, such as oils, butter, and shortening. Avoid the following as told by your doctor: Foods that cause symptoms. These may be different for different people. Keep a food diary to keep track of foods that cause symptoms. Alcohol. Drinking a lot of liquid with meals. Eating meals during the 2-3 hours before bed. Lifestyle Stay at a healthy weight. Ask your doctor what weight is healthy for you. If you need to lose weight, work with your doctor to do so safely. Exercise for at least 30 minutes on 5 or more days each week, or as told by your doctor. Wear loose-fitting clothes. Do not smoke or use any products that contain nicotine or  tobacco. If you need help quitting, ask your doctor. Sleep with the head of your bed higher than your feet. Use a wedge under the mattress or blocks under the bed frame to raise the head of the bed. Chew sugar-free gum after meals. What foods should eat? Eat a healthy, well-balanced diet of fruits, vegetables, whole grains, low-fat dairy products, lean meats, fish, and poultry. Each person is different. Foods that may cause symptoms in one person may not cause any symptoms in another person. Work with your doctor to find foods that are safe for you. The items listed above may not be a complete list of what you can eat and drink. Contact a food expert for more options. What foods should I avoid? Limiting some of these foods may help in managing the symptoms of GERD. Everyone is different. Talk with a food expert or your doctor to help you find the exact foods to avoid, if any. Fruits Any fruits prepared with added fat. Any fruits that cause symptoms. For some people, this may include citrus fruits, such as oranges, grapefruit, pineapple, and lemons. Vegetables Deep-fried vegetables. [place] fries. Any vegetables prepared with added fat. Any vegetables that cause symptoms. For some people, this may include tomatoes and tomato products, chili peppers, onions and garlic, and horseradish. Grains Pastries or quick breads with added fat. Meats and other proteins High-fat meats, such as fatty beef or pork, hot dogs, ribs, ham, sausage, salami, and bacon. Fried meat or protein, including fried fish  and fried chicken. Nuts and nut butters, in large amounts. Dairy Whole milk and chocolate milk. Sour cream. Cream. Ice cream. Cream cheese. Milkshakes. Fats and oils Butter. Margarine. Shortening. Ghee. Beverages Coffee and tea, with or without caffeine. Carbonated beverages. Sodas. Energy drinks. Fruit juice made with acidic fruits, such as orange or grapefruit. Tomato juice. Alcoholic drinks. Sweets and  desserts Chocolate and cocoa. Donuts. Seasonings and condiments Pepper. Peppermint and spearmint. Added salt. Any condiments, herbs, or seasonings that cause symptoms. For some people, this may include curry, hot sauce, or vinegar-based salad dressings. The items listed above may not be a complete list of what you should not eat and drink. Contact a food expert for more options. Questions to ask your doctor Diet and lifestyle changes are often the first steps that are taken to manage symptoms of GERD. If diet and lifestyle changes do not help, talk with your doctor about taking medicines. Where to find more information International Foundation for Gastrointestinal Disorders: aboutgerd.org Summary When you have GERD, food and lifestyle choices are very important in easing your symptoms. Eat small meals often instead of 3 large meals a day. Eat your meals slowly and in a place where you are relaxed. Avoid bending over or lying down until 2-3 hours after eating. Limit high-fat foods such as fatty meats or fried foods. This information is not intended to replace advice given to you by your health care provider. Make sure you discuss any questions you have with your health care provider. Document Revised: 01/25/2020 Document Reviewed: 01/25/2020 Elsevier Patient Education  [place].

## 2021-08-17 NOTE — Addendum Note (Signed)
Addended by: Wayna Chalet on: 08/17/2021 04:33 PM   Modules accepted: Orders

## 2021-08-17 NOTE — Progress Notes (Signed)
[name] MD, MRCP(U.K) [address]  [place]  [place], [place] [address]  Main: [phone]  Fax: [phone]   Primary Care Physician: Freddy Finner, NP  Primary Gastroenterologist:  Dr. [name]   C/c : Follow up ED visit    HPI: Lori Bentley is a 36 y.o. female  Summary of history :   Initially referred and seen in January 2021 for anemia.She was admitted to the hospital in 03/19/2019 with near syncope secondary to anemia hemoglobin was noted to be 5.8 g.  Noted to have severe iron deficiency and given IV iron.  She also received a blood transfusion.    Ferritin was 6 at that time.  History of menorrhagia for which she underwent a myomectomy of uterine fibroids in June 2019.  Looking back into her records in 2017 she also had microcytic anemia and was seen by another gastroenterologist unclear what happened after that.  Denied any overt loss. 09/18/2019: EGD: Large hiatal hernia was noted otherwise normal endoscopic evaluation colonoscopy performed on the same day demonstrated no abnormalities.    Interval history   09/30/2019-08/17/2021   08/19/2019: Hemoglobin 7.8 g with an MCV of 78.4.  Iron of 11 and ferritin of 4.  09/18/2019: Colonoscopy : normal  11/23/2019: Capsule study normal   07/04/2021: ER visit for dark stool.  07/06/2021: [place] x3 days ER visit.  07/08/2021: ER ABDOMINAL PAIN: [facility] ER visit  07/31/2021: hb 12.3  07/07/2021 CT scan of the abdomen pelvis with contrast showed no acute process. Follows with hematology Dr. Janese Banks getting IV iron. Doing well no complaints normal bowel movements.  Since ER visit no rectal bleeding , Coffee ground emesis. Taking Protonix 40 mg BID, no nausea or vomiting . Denies any NSAID use. No abdominal pain .      Current Outpatient Medications  Medication Sig Dispense Refill   albuterol (PROVENTIL HFA;VENTOLIN HFA) 108 (90 Base) MCG/ACT inhaler Inhale 2 puffs into the lungs every 6 (six) hours as needed for  wheezing or shortness of breath. 1 Inhaler 0   ferrous sulfate 325 (65 FE) MG tablet Take 1 tablet (325 mg total) by mouth daily. (Patient not taking: Reported on 07/06/2021) 90 tablet 0   FLOVENT HFA 110 MCG/ACT inhaler Inhale 2 puffs into the lungs in the morning and at bedtime.     hydrALAZINE (APRESOLINE) 25 MG tablet Take by mouth. (Patient not taking: Reported on 05/16/2021)     loratadine (CLARITIN) 10 MG tablet Take 1 tablet by mouth daily. (Patient not taking: Reported on 05/16/2021)     metoCLOPramide (REGLAN) 10 MG tablet Take 1 tablet (10 mg total) by mouth 3 (three) times daily with meals. 10 tablet 0   mupirocin cream (BACTROBAN) 2 % Apply to affected area 3 times daily 30 g 0   ondansetron (ZOFRAN) 4 MG tablet Take 1 tablet (4 mg total) by mouth daily as needed for nausea or vomiting. (Patient not taking: Reported on 07/06/2021) 30 tablet 1   pantoprazole (PROTONIX) 40 MG tablet TAKE 1 TABLET BY MOUTH DAILY 90 tablet 1   potassium chloride SA (KLOR-CON M) 20 MEQ tablet Take 1 tablet (20 mEq total) by mouth daily for 5 days. 5 tablet 0   No current facility-administered medications for this visit.    Allergies as of 08/17/2021 - Review Complete 07/31/2021  Allergen Reaction Noted   Nsaids  12/26/2017   Oxycodone Swelling 02/18/2020   Peanut-containing drug products Itching 01/02/2018   Percocet [oxycodone-acetaminophen] Swelling  01/15/2015   Phenylephrine-guaifenesin Other (See Comments) 06/19/2012    ROS:  General: Negative for anorexia, weight loss, fever, chills, fatigue, weakness. ENT: Negative for hoarseness, difficulty swallowing , nasal congestion. CV: Negative for chest pain, angina, palpitations, dyspnea on exertion, peripheral edema.  Respiratory: Negative for dyspnea at rest, dyspnea on exertion, cough, sputum, wheezing.  GI: See history of present illness. GU:  Negative for dysuria, hematuria, urinary incontinence, urinary frequency, nocturnal urination.  Endo:  Negative for unusual weight change.    Physical Examination:   There were no vitals taken for this visit.  General: Well-nourished, well-developed in no acute distress.  Eyes: No icterus. Conjunctivae pink. Mouth: Oropharyngeal mucosa moist and pink , no lesions erythema or exudate. Lungs: Clear to auscultation bilaterally. Non-labored. Heart: Regular rate and rhythm, no murmurs rubs or gallops.  Abdomen: Bowel sounds are normal, nontender, nondistended, no hepatosplenomegaly or masses, no abdominal bruits or hernia , no rebound or guarding.   Extremities: No lower extremity edema. No clubbing or deformities. Neuro: Alert and oriented x 3.  Grossly intact. Skin: Warm and dry, no jaundice.   Psych: Alert and cooperative, normal mood and affect.   Imaging Studies: DG Abdomen Acute W/Chest  Result Date: 07/20/2021 CLINICAL DATA:  Nausea, vomiting, dizziness and weakness. EXAM: DG ABDOMEN ACUTE WITH 1 VIEW CHEST COMPARISON:  None. FINDINGS: There is no evidence of dilated bowel loops or free intraperitoneal air. A large hiatal hernia is seen. Ill-defined surgical sutures are seen within the medial aspect of the left upper quadrant. No radiopaque calculi or other significant radiographic abnormality is seen. An IUD is in place. Bilateral radiopaque tubal ligation clips are also noted. Subcentimeter phleboliths are seen within the pelvis. Heart size and mediastinal contours are within normal limits. Both lungs are clear. IMPRESSION: Large hiatal hernia, without evidence of bowel obstruction or acute cardiopulmonary disease. Electronically Signed   By: Virgina Norfolk M.D.   On: 07/20/2021 21:29    Assessment and Plan:   Lori Bentley is a 36 y.o. y/o femalehere to follow-up after ER visit for recurrent episodes of nausea and vomiting very likely related to GERD from large hiatal hernia . Since commencing on Protonix BID has had no recurrence of symptoms.    Plan 1.  EGD tomorrow  to evaluate coffee ground emesis.  2.  H. pylori breath test, continue pantoprazole 40 mg twice daily. needed. 3.  iron studies if low will give her IV iron as she probably is not 4.  GERD patient information provided with a history of hiatal hernia and refer her to [facility] or [facility] for hiatal hernia repair as she is very young and may not be a good idea to be on PPIs for many years to come.  In the meanwhile she will continue to enforce lifestyle changes as well use wedge pillow. Patient information provided.  I have discussed alternative options, risks & benefits,  which include, but are not limited to, bleeding, infection, perforation,respiratory complication & drug reaction.  The patient agrees with this plan & written consent will be obtained.     Dr [name]  MD,MRCP [facility]) Follow up in 3 months

## 2021-08-18 ENCOUNTER — Ambulatory Visit
Admission: RE | Admit: 2021-08-18 | Discharge: 2021-08-18 | Disposition: A | Discharge status: Home / Self Care | Payer: Medicaid Other | Attending: Gastroenterology | Admitting: Gastroenterology

## 2021-08-18 ENCOUNTER — Other Ambulatory Visit: Payer: Self-pay

## 2021-08-18 ENCOUNTER — Ambulatory Visit: Payer: Medicaid Other | Admitting: Anesthesiology

## 2021-08-18 ENCOUNTER — Encounter: Payer: Self-pay | Admitting: Gastroenterology

## 2021-08-18 ENCOUNTER — Encounter
Admission: RE | Disposition: A | Discharge status: Home / Self Care | Payer: Self-pay | Source: Home / Self Care | Attending: Gastroenterology

## 2021-08-18 DIAGNOSIS — E119 Type 2 diabetes mellitus without complications: Secondary | ICD-10-CM | POA: Diagnosis not present

## 2021-08-18 DIAGNOSIS — D509 Iron deficiency anemia, unspecified: Secondary | ICD-10-CM

## 2021-08-18 DIAGNOSIS — K449 Diaphragmatic hernia without obstruction or gangrene: Secondary | ICD-10-CM | POA: Insufficient documentation

## 2021-08-18 DIAGNOSIS — R112 Nausea with vomiting, unspecified: Secondary | ICD-10-CM

## 2021-08-18 DIAGNOSIS — Z6834 Body mass index (BMI) 34.0-34.9, adult: Secondary | ICD-10-CM | POA: Insufficient documentation

## 2021-08-18 DIAGNOSIS — K219 Gastro-esophageal reflux disease without esophagitis: Secondary | ICD-10-CM | POA: Diagnosis not present

## 2021-08-18 DIAGNOSIS — J45909 Unspecified asthma, uncomplicated: Secondary | ICD-10-CM | POA: Diagnosis not present

## 2021-08-18 HISTORY — PX: ESOPHAGOGASTRODUODENOSCOPY (EGD) WITH PROPOFOL: SHX5813

## 2021-08-18 LAB — IRON,TIBC AND FERRITIN PANEL
Ferritin: 28 ng/mL (ref 15–150)
Iron Saturation: 17 % (ref 15–55)
Iron: 58 ug/dL (ref 27–159)
Total Iron Binding Capacity: 332 ug/dL (ref 250–450)
UIBC: 274 ug/dL (ref 131–425)

## 2021-08-18 LAB — H. PYLORI BREATH TEST: H pylori Breath Test: NEGATIVE

## 2021-08-18 LAB — POCT PREGNANCY, URINE: Preg Test, Ur: NEGATIVE

## 2021-08-18 SURGERY — ESOPHAGOGASTRODUODENOSCOPY (EGD) WITH PROPOFOL
Anesthesia: General

## 2021-08-18 MED ORDER — SODIUM CHLORIDE 0.9 % IV SOLN: INTRAVENOUS | Status: DC

## 2021-08-18 MED ORDER — LIDOCAINE HCL (CARDIAC) PF 100 MG/5ML IV SOSY
PREFILLED_SYRINGE | INTRAVENOUS | Status: DC | PRN
  Administered 2021-08-18: 50 mg via INTRAVENOUS

## 2021-08-18 MED ORDER — PROPOFOL 10 MG/ML IV BOLUS
INTRAVENOUS | Status: DC | PRN
  Administered 2021-08-18: 40 mg via INTRAVENOUS
  Administered 2021-08-18: 80 mg via INTRAVENOUS
  Administered 2021-08-18: 20 mg via INTRAVENOUS

## 2021-08-18 MED ORDER — DEXMEDETOMIDINE HCL IN NACL 200 MCG/50ML IV SOLN
INTRAVENOUS | Status: DC | PRN
  Administered 2021-08-18: 8 ug via INTRAVENOUS
  Administered 2021-08-18: 4 ug via INTRAVENOUS

## 2021-08-18 MED ORDER — LIDOCAINE HCL (PF) 1 % IJ SOLN
INTRAMUSCULAR | Status: AC
  Administered 2021-08-18: 0.3 mL
  Filled 2021-08-18: qty 2

## 2021-08-18 NOTE — Anesthesia Postprocedure Evaluation (Signed)
Anesthesia Post Note  Patient: Lori Bentley  Procedure(s) Performed: ESOPHAGOGASTRODUODENOSCOPY (EGD) WITH PROPOFOL  Patient location during evaluation: Endoscopy Anesthesia Type: General Level of consciousness: awake and alert Pain management: pain level controlled Vital Signs Assessment: post-procedure vital signs reviewed and stable Respiratory status: spontaneous breathing, nonlabored ventilation, respiratory function stable and patient connected to nasal cannula oxygen Cardiovascular status: blood pressure returned to baseline and stable Postop Assessment: no apparent nausea or vomiting Anesthetic complications: no   No notable events documented.   Last Vitals:  Vitals:   08/18/21 1136 08/18/21 1146  BP: 105/84 105/67  Pulse: 83 86  Resp: 15 15  Temp:    SpO2: 100%     Last Pain:  Vitals:   08/18/21 1146  TempSrc:   PainSc: 0-No pain                 Precious Haws Fender Herder

## 2021-08-18 NOTE — H&P (Signed)
[name], MD [address], [place], [place], [place], [address] [address], [place], [place], [place], [address] Phone: [phone]  Fax: [phone]  Primary Care Physician:  Freddy Finner, NP   Pre-Procedure History & Physical: HPI:  Lori Bentley is a 36 y.o. female is here for an endoscopy    Past Medical History:  Diagnosis Date   Anxiety    Asthma    WELL CONTROLLED   Bipolar affective disorder, currently manic, mild ([place])    Chronic anemia    Depression    Diabetes ([place]) 12/2017   patient denies diabetes   GERD (gastroesophageal reflux disease)    History of blood transfusion 2018   GI BLEED   History of hiatal hernia     Past Surgical History:  Procedure Laterality Date   COLONOSCOPY WITH PROPOFOL N/A 09/18/2019   Procedure: COLONOSCOPY WITH PROPOFOL;  Surgeon: [name], MD;  Location: [facility] ENDOSCOPY;  Service: Gastroenterology;  Laterality: N/A;   ESOPHAGOGASTRODUODENOSCOPY (EGD) WITH PROPOFOL N/A 05/18/2016   Procedure: ESOPHAGOGASTRODUODENOSCOPY (EGD) WITH PROPOFOL;  Surgeon: Manya Silvas, MD;  Location: [facility] ENDOSCOPY;  Service: Endoscopy;  Laterality: N/A;   ESOPHAGOGASTRODUODENOSCOPY (EGD) WITH PROPOFOL N/A 09/18/2019   Procedure: ESOPHAGOGASTRODUODENOSCOPY (EGD) WITH PROPOFOL;  Surgeon: [name], MD;  Location: [facility] ENDOSCOPY;  Service: Gastroenterology;  Laterality: N/A;   GIVENS CAPSULE STUDY N/A 11/09/2019   Procedure: GIVENS CAPSULE STUDY;  Surgeon: [name], MD;  Location: [facility] ENDOSCOPY;  Service: Gastroenterology;  Laterality: N/A;   MYOMECTOMY N/A 01/02/2018   Procedure: MYOMECTOMY;  Surgeon: Gae Dry, MD;  Location: [facility] ORS;  Service: Gynecology;  Laterality: N/A;   TUBAL LIGATION  2012    Prior to Admission medications   Medication Sig Start Date End Date Taking? Authorizing Provider  albuterol (PROVENTIL HFA;VENTOLIN HFA) 108 (90 Base) MCG/ACT inhaler Inhale 2 puffs into the lungs every 6 (six) hours  as needed for wheezing or shortness of breath. 11/29/17  Yes Darel Hong, MD  FLOVENT HFA 110 MCG/ACT inhaler Inhale 2 puffs into the lungs in the morning and at bedtime. 01/11/21  Yes [provider]  metoCLOPramide (REGLAN) 10 MG tablet Take 1 tablet (10 mg total) by mouth 3 (three) times daily with meals. 07/20/21 07/20/22 Yes Merlyn Lot, MD  mupirocin cream Drue Stager) 2 % Apply to affected area 3 times daily 07/06/21 07/06/22 Yes Bradler, Vista Lawman, MD  pantoprazole (PROTONIX) 40 MG tablet TAKE 1 TABLET BY MOUTH DAILY 04/11/20  Yes [name], MD  ferrous sulfate 325 (65 FE) MG tablet Take 1 tablet (325 mg total) by mouth daily. Patient not taking: Reported on 07/06/2021 02/06/21 05/16/21  Merlyn Lot, MD  hydrALAZINE (APRESOLINE) 25 MG tablet Take by mouth. Patient not taking: Reported on 05/16/2021    [provider]  loratadine (CLARITIN) 10 MG tablet Take 1 tablet by mouth daily. Patient not taking: Reported on 05/16/2021 07/05/14   [provider]  ondansetron (ZOFRAN) 4 MG tablet Take 1 tablet (4 mg total) by mouth daily as needed for nausea or vomiting. Patient not taking: Reported on 07/06/2021 07/06/21 07/06/22  Naaman Plummer, MD  potassium chloride SA (KLOR-CON M) 20 MEQ tablet Take 1 tablet (20 mEq total) by mouth daily for 5 days. 07/20/21 07/25/21  Merlyn Lot, MD    Allergies as of 08/17/2021 - Review Complete 08/17/2021  Allergen Reaction Noted   Nsaids  12/26/2017   Oxycodone Swelling 02/18/2020   Peanut-containing drug products Itching 01/02/2018  Percocet [oxycodone-acetaminophen] Swelling 01/15/2015   Phenylephrine-guaifenesin Other (See Comments) 06/19/2012    Family History  Problem Relation Age of Onset   Anxiety disorder Mother    Asthma Mother    Fibroids Mother    GER disease Father     Social History   Socioeconomic History   Marital status: Married    Spouse name: Not on file   Number of children: Not on file    Years of education: Not on file   Highest education level: Not on file  Occupational History   Not on file  Tobacco Use   Smoking status: Former    Packs/day: 0.25    Years: 1.00    Pack years: 0.25    Types: Cigarettes    Quit date: 10/24/2020    Years since quitting: 0.8   Smokeless tobacco: Never  Vaping Use   Vaping Use: Never used  Substance and Sexual Activity   Alcohol use: Not Currently   Drug use: Yes    Types: Marijuana    Comment: occasionally    Sexual activity: Yes    Birth control/protection: I.U.D.    Comment: mirena  Other Topics Concern   Not on file  Social History Narrative   Not on file   Social Determinants of Health   Financial Resource Strain: Not on file  Food Insecurity: Not on file  Transportation Needs: Not on file  Physical Activity: Not on file  Stress: Not on file  Social Connections: Not on file  Intimate Partner Violence: Not on file    Review of Systems: See HPI, otherwise negative ROS  Physical Exam: BP (!) 117/93    Pulse 92    Temp (!) 96.7 F (35.9 C)    Resp 18    Ht 5' (1.524 m)    Wt 79.8 kg    SpO2 99%    BMI 34.37 kg/m  General:   Alert,  pleasant and cooperative in NAD Head:  Normocephalic and atraumatic. Neck:  Supple; no masses or thyromegaly. Lungs:  Clear throughout to auscultation, normal respiratory effort.    Heart:  +S1, +S2, Regular rate and rhythm, No edema. Abdomen:  Soft, nontender and nondistended. Normal bowel sounds, without guarding, and without rebound.   Neurologic:  Alert and  oriented x4;  grossly normal neurologically.  Impression/Plan: Lori Bentley is here for an endoscopy  to be performed for  evaluation of nausea and vomiting    Risks, benefits, limitations, and alternatives regarding endoscopy have been reviewed with the patient.  Questions have been answered.  All parties agreeable.   [name], MD  08/18/2021, 10:48 AM

## 2021-08-18 NOTE — Op Note (Signed)
[facility] Gastroenterology Patient Name: Lori Bentley Procedure Date: 08/18/2021 10:53 AM MRN: [id number] Account #: [id number] Date of Birth: 1986/05/04 Admit Type: Outpatient Age: 36 Room: [facility] ENDO ROOM 4 Gender: Female Note Status: Finalized Instrument Name: Upper Endoscope [id number] Procedure:             Upper GI endoscopy Indications:           Nausea with vomiting Providers:             [name] MD, MD Referring MD:          Neomia Dear (Referring MD) Medicines:             Monitored Anesthesia Care Complications:         No immediate complications. Procedure:             Pre-Anesthesia Assessment:                        - Prior to the procedure, a History and Physical was                         performed, and patient medications, allergies and                         sensitivities were reviewed. The patient's tolerance                         of previous anesthesia was reviewed.                        - The risks and benefits of the procedure and the                         sedation options and risks were discussed with the                         patient. All questions were answered and informed                         consent was obtained.                        - ASA Grade Assessment: II - A patient with mild                         systemic disease.                        After obtaining informed consent, the endoscope was                         passed under direct vision. Throughout the procedure,                         the patient's blood pressure, pulse, and oxygen                         saturations were monitored continuously. The Endoscope                         was  introduced through the mouth, and advanced to the                         third part of duodenum. The upper GI endoscopy was                         accomplished with ease. The patient tolerated the                         procedure well. Findings:      The  esophagus was normal.      A large hiatal hernia was present.      The examined duodenum was normal.      Localized mild inflammation characterized by congestion (edema) and       erythema was found on the greater curvature of the stomach. Impression:            - Normal esophagus.                        - Large hiatal hernia.                        - Normal examined duodenum.                        - No specimens collected. Recommendation:        - Discharge patient to home (with escort).                        - Resume previous diet.                        - Continue present medications.                        - Await pathology results.                        - Return to my office as previously scheduled. [name], MD [name] MD, MD 08/18/2021 11:14:45 AM This report has been signed electronically. Number of Addenda: 0 Note Initiated On: 08/18/2021 10:53 AM Estimated Blood Loss:  Estimated blood loss: none.      [facility]

## 2021-08-18 NOTE — Anesthesia Preprocedure Evaluation (Signed)
Anesthesia Evaluation  Patient identified by MRN, date of birth, ID band Patient awake    Reviewed: Allergy & Precautions, H&P , NPO status , Patient's Chart, lab work & pertinent test results  History of Anesthesia Complications Negative for: history of anesthetic complications  Airway Mallampati: II  TM Distance: >3 FB Neck ROM: full    Dental  (+) Poor Dentition, Chipped   Pulmonary neg shortness of breath, asthma , Current Smoker, former smoker,    Pulmonary exam normal breath sounds clear to auscultation       Cardiovascular Exercise Tolerance: Good (-) angina(-) Past MI and (-) DOE Normal cardiovascular exam     Neuro/Psych  Headaches, PSYCHIATRIC DISORDERS Anxiety Depression negative psych ROS   GI/Hepatic Neg liver ROS, hiatal hernia, GERD  Controlled,  Endo/Other  diabetes, Type 2Morbid obesity  Renal/GU negative Renal ROS  negative genitourinary   Musculoskeletal   Abdominal   Peds  Hematology negative hematology ROS (+)   Anesthesia Other Findings Past Medical History: No date: Anxiety No date: Asthma No date: Chronic anemia No date: Depression No date: Diabetes ([facility]) No date: GERD (gastroesophageal reflux disease)  Past Surgical History: No date: TUBAL LIGATION  BMI    Body Mass Index:  40.19 kg/m      Reproductive/Obstetrics negative OB ROS                             Anesthesia Physical  Anesthesia Plan  ASA: 3  Anesthesia Plan: General   Post-op Pain Management:    Induction: Intravenous  PONV Risk Score and Plan: Propofol infusion and TIVA  Airway Management Planned: Natural Airway and Nasal Cannula  Additional Equipment:   Intra-op Plan:   Post-operative Plan:   Informed Consent: I have reviewed the patients History and Physical, chart, labs and discussed the procedure including the risks, benefits and alternatives for the proposed anesthesia  with the patient or authorized representative who has indicated his/her understanding and acceptance.     Dental Advisory Given  Plan Discussed with: Anesthesiologist, CRNA and Surgeon  Anesthesia Plan Comments: (Patient consented for risks of anesthesia including but not limited to:  - adverse reactions to medications - risk of airway placement if required - damage to eyes, teeth, lips or other oral mucosa - nerve damage due to positioning  - sore throat or hoarseness - Damage to heart, brain, nerves, lungs, other parts of body or loss of life  Patient voiced understanding.)        Anesthesia Quick Evaluation

## 2021-08-18 NOTE — Transfer of Care (Signed)
Immediate Anesthesia Transfer of Care Note  Patient: Lori Bentley  Procedure(s) Performed: ESOPHAGOGASTRODUODENOSCOPY (EGD) WITH PROPOFOL  Patient Location: PACU and Endoscopy Unit  Anesthesia Type:General  Level of Consciousness: drowsy  Airway & Oxygen Therapy: Patient Spontanous Breathing  Post-op Assessment: Report given to RN and Post -op Vital signs reviewed and stable  Post vital signs: Reviewed and stable  Last Vitals:  Vitals Value Taken Time  BP 102/63 08/18/21 1118  Temp 35.9 C 08/18/21 1116  Pulse 98 08/18/21 1118  Resp 19 08/18/21 1118  SpO2 98 % 08/18/21 1118  Vitals shown include unvalidated device data.  Last Pain:  Vitals:   08/18/21 1116  TempSrc: Temporal  PainSc: Asleep         Complications: No notable events documented.

## 2021-08-21 ENCOUNTER — Encounter: Payer: Self-pay | Admitting: Gastroenterology

## 2021-08-21 LAB — SURGICAL PATHOLOGY

## 2021-09-07 ENCOUNTER — Emergency Department
Admission: EM | Admit: 2021-09-07 | Discharge: 2021-09-07 | Disposition: A | Discharge status: Home / Self Care | Payer: Medicaid Other | Attending: Emergency Medicine | Admitting: Emergency Medicine

## 2021-09-07 ENCOUNTER — Other Ambulatory Visit: Payer: Self-pay

## 2021-09-07 DIAGNOSIS — D649 Anemia, unspecified: Secondary | ICD-10-CM | POA: Insufficient documentation

## 2021-09-07 DIAGNOSIS — Z20822 Contact with and (suspected) exposure to covid-19: Secondary | ICD-10-CM | POA: Insufficient documentation

## 2021-09-07 DIAGNOSIS — F419 Anxiety disorder, unspecified: Secondary | ICD-10-CM | POA: Diagnosis not present

## 2021-09-07 DIAGNOSIS — R55 Syncope and collapse: Secondary | ICD-10-CM | POA: Diagnosis present

## 2021-09-07 LAB — BASIC METABOLIC PANEL
Anion gap: 6 (ref 5–15)
BUN: 9 mg/dL (ref 6–20)
CO2: 26 mmol/L (ref 22–32)
Calcium: 8.6 mg/dL — ABNORMAL LOW (ref 8.9–10.3)
Chloride: 106 mmol/L (ref 98–111)
Creatinine, Ser: 0.77 mg/dL (ref 0.44–1.00)
GFR, Estimated: >60 mL/min (ref >60)
Glucose, Bld: 105 mg/dL — ABNORMAL HIGH (ref 70–99)
Potassium: 3.5 mmol/L (ref 3.5–5.1)
Sodium: 138 mmol/L (ref 135–145)

## 2021-09-07 LAB — TROPONIN I (HIGH SENSITIVITY): Troponin I (High Sensitivity): 3 ng/L (ref <18)

## 2021-09-07 LAB — CBC WITH DIFFERENTIAL/PLATELET
Abs Immature Granulocytes: 0.04 10*3/uL (ref 0.00–0.07)
Basophils Absolute: 0 10*3/uL (ref 0.0–0.1)
Basophils Relative: 0 %
Eosinophils Absolute: 0.4 10*3/uL (ref 0.0–0.5)
Eosinophils Relative: 3 %
HCT: 36.1 % (ref 36.0–46.0)
Hemoglobin: 11.9 g/dL — ABNORMAL LOW (ref 12.0–15.0)
Immature Granulocytes: 0 %
Lymphocytes Relative: 17 %
Lymphs Abs: 1.9 10*3/uL (ref 0.7–4.0)
MCH: 27.4 pg (ref 26.0–34.0)
MCHC: 33 g/dL (ref 30.0–36.0)
MCV: 83.2 fL (ref 80.0–100.0)
Monocytes Absolute: 0.4 10*3/uL (ref 0.1–1.0)
Monocytes Relative: 4 %
Neutro Abs: 8.3 10*3/uL — ABNORMAL HIGH (ref 1.7–7.7)
Neutrophils Relative %: 76 %
Platelets: 389 10*3/uL (ref 150–400)
RBC: 4.34 MIL/uL (ref 3.87–5.11)
RDW: 15.5 % (ref 11.5–15.5)
WBC: 11 10*3/uL — ABNORMAL HIGH (ref 4.0–10.5)
nRBC: 0 % (ref 0.0–0.2)

## 2021-09-07 LAB — RESP PANEL BY RT-PCR (FLU A&B, COVID) ARPGX2
Influenza A by PCR: NEGATIVE
Influenza B by PCR: NEGATIVE
SARS Coronavirus 2 by RT PCR: NEGATIVE

## 2021-09-07 LAB — TSH: TSH: 1.072 u[IU]/mL (ref 0.350–4.500)

## 2021-09-07 LAB — T4, FREE: Free T4: 0.9 ng/dL (ref 0.61–1.12)

## 2021-09-07 LAB — POC URINE PREG, ED: Preg Test, Ur: NEGATIVE

## 2021-09-07 MED ORDER — ALPRAZOLAM 0.5 MG PO TABS
0.2500 mg | ORAL_TABLET | Freq: Once | ORAL | Status: AC
  Administered 2021-09-07: 0.25 mg via ORAL
  Filled 2021-09-07: qty 1

## 2021-09-07 NOTE — ED Notes (Signed)
ED Provider at bedside. 

## 2021-09-07 NOTE — ED Provider Notes (Signed)
[facility] Provider Note    Event Date/Time   First MD Initiated Contact with Patient 09/07/21 1952     (approximate)   History   Anxiety and Loss of Consciousness   HPI  Lori Bentley is a 36 y.o. female with iron deficient anemia who comes in with concerns for anxiety.  Patient reports that she ate around 1400 and took her anxiety medications around 1630 she reports that she went to start cooking dinner when she started to feel really anxious like she is having a panic attack and then lost consciousness.  She reports that she has had syncopal episodes previously from panic attacks.  She denies hitting her head from what she knows and denies any headaches or bumps on her head.  She denies any chest pain or shortness of breath.  Denies any unilateral leg swelling, abdominal pain.  Patient reports that she does have a Mirena in place.  She does report a lot of stress recently.  Denies any urinating on self, tongue biting, seizure activity.  Physical Exam   Triage Vital Signs: ED Triage Vitals  Enc Vitals Group     BP 09/07/21 1934 (!) 126/98     Pulse Rate 09/07/21 1934 88     Resp 09/07/21 1934 16     Temp 09/07/21 1934 99.4 F (37.4 C)     Temp Source 09/07/21 1934 Oral     SpO2 09/07/21 1934 99 %     Weight 09/07/21 1936 187 lb (84.8 kg)     Height 09/07/21 1936 5' (1.524 m)     Head Circumference --      Peak Flow --      Pain Score 09/07/21 1936 0     Pain Loc --      Pain Edu? --      Excl. in [place]? --     Most recent vital signs: Vitals:   09/07/21 1934  BP: (!) 126/98  Pulse: 88  Resp: 16  Temp: 99.4 F (37.4 C)  SpO2: 99%     General: Awake, no distress.  CV:  Good peripheral perfusion.  Resp:  Normal effort.  Abd:  No distention.  Soft and nontender Other:  No swelling, no calf tenderness   ED Results / Procedures / Treatments   Labs (all labs ordered are listed, but only abnormal results are displayed) Labs  Reviewed  CBC WITH DIFFERENTIAL/PLATELET - Abnormal; Notable for the following components:      Result Value   WBC 11.0 (*)    Hemoglobin 11.9 (*)    Neutro Abs 8.3 (*)    All other components within normal limits  BASIC METABOLIC PANEL  CBG MONITORING, ED  POC URINE PREG, ED     EKG  My interpretation of EKG:  Normal sinus rate of 82 without any ST elevation or T wave inversions except for maybe 1 in lead III, normal intervals.  She did have this T wave inversion before.  RADIOLOGY Patient declined CT head  PROCEDURES:  Critical Care performed: No  .1-3 Lead EKG Interpretation Performed by: Vanessa [place], MD Authorized by: Vanessa [place], MD     Interpretation: normal     ECG rate:  80   ECG rate assessment: normal     Rhythm: sinus rhythm     Ectopy: none     Conduction: normal     MEDICATIONS ORDERED IN ED: Medications  ALPRAZolam (XANAX) tablet 0.25 mg (0.25 mg  Oral Given 09/07/21 2046)     IMPRESSION / MDM / ASSESSMENT AND PLAN / ED COURSE  I reviewed the triage vital signs and the nursing notes.                              Patient with history of anxiety who comes in with concerns for syncopal episode Differential diagnosis includes, but is not limited to, anxiety, vasovagal, topic pregnancy.  Will get labs, UA Preg.  On review of records patient has been here previously for syncopal episode and anxiety attacks and typically is given a little bit of Xanax to help.  She reports that this typically makes her feel a lot better.  I considered the possibility of PE.  She is on a Mirena IUD but otherwise denies any other risk factors for PE and is adamant that she has no shortness of breath or pleuritic chest pain and reports this happening previously from her anxiety so seems more likely related to the anxiety, we will hold off on D-dimer.  Labs ordered to make sure no evidence of anemia, Electra abnormalities, thyroid dysfunction.  Do not feel that this represents a  seizure  COVID, flu are negative BMP is normal CBC shows slightly elevated white count but downtrending from last month preg test is negative COVID, flu was Troponin was negative  I personally reviewed patient's note from GI on 1/19 patient was seen by Dr. Vicente Males with a plan for EGD.  Reevaluated patient and she is requesting discharge home.  She states that she is feeling much better after the medications.  She denies any SI or HI.  She can follow-up with her psychiatrist for her anxiety.  I suspect that this could have happened today due to her not taking her anxiety medication on time.  She normally supposed take it once in the morning and once in the afternoon and she did not take her morning dose.  I again discussed if we need to do any CT of her head given the concern for syncope but she denies hitting her head and denies any headaches at this time.  She feels comfortable with discharge home  Given the syncopal episode we will give her cardiology's number for follow-up   The patient is on the cardiac monitor to evaluate for evidence of arrhythmia and/or significant heart rate changes.   FINAL CLINICAL IMPRESSION(S) / ED DIAGNOSES   Final diagnoses:  Anxiety  Syncope and collapse     Rx / DC Orders   ED Discharge Orders     None        Note:  This document was prepared using Dragon voice recognition software and may include unintentional dictation errors.   Vanessa [place], MD 09/07/21 2238

## 2021-09-07 NOTE — ED Triage Notes (Signed)
Pt c/o anxiety & feeling jittery today. Pt reports she had eaten around 14:00, took anxiety medications around 16:30, laid down, jumped up because she felt weird. Pt reports she was cooking and "passed out" around 17:00.

## 2021-09-07 NOTE — Discharge Instructions (Addendum)
You can call one of the cardiology doctors to get follow-up.  You can discuss a Holter monitor and echocardiogram given you have had multiple episodes of syncope.  This could be from your anxiety however you should discuss further with your psychiatrist.  Return to the ER if develop worsening symptoms or any other concerns

## 2021-09-23 ENCOUNTER — Encounter: Payer: Self-pay | Admitting: Emergency Medicine

## 2021-09-23 ENCOUNTER — Emergency Department: Payer: No Typology Code available for payment source

## 2021-09-23 ENCOUNTER — Other Ambulatory Visit: Payer: Self-pay

## 2021-09-23 DIAGNOSIS — J45909 Unspecified asthma, uncomplicated: Secondary | ICD-10-CM | POA: Diagnosis not present

## 2021-09-23 DIAGNOSIS — R825 Elevated urine levels of drugs, medicaments and biological substances: Secondary | ICD-10-CM | POA: Insufficient documentation

## 2021-09-23 DIAGNOSIS — F419 Anxiety disorder, unspecified: Secondary | ICD-10-CM | POA: Diagnosis not present

## 2021-09-23 DIAGNOSIS — E119 Type 2 diabetes mellitus without complications: Secondary | ICD-10-CM | POA: Diagnosis not present

## 2021-09-23 DIAGNOSIS — R4182 Altered mental status, unspecified: Secondary | ICD-10-CM | POA: Diagnosis present

## 2021-09-23 LAB — COMPREHENSIVE METABOLIC PANEL
ALT: 14 U/L (ref 0–44)
AST: 18 U/L (ref 15–41)
Albumin: 4 g/dL (ref 3.5–5.0)
Alkaline Phosphatase: 55 U/L (ref 38–126)
Anion gap: 12 (ref 5–15)
BUN: 9 mg/dL (ref 6–20)
CO2: 25 mmol/L (ref 22–32)
Calcium: 8.9 mg/dL (ref 8.9–10.3)
Chloride: 98 mmol/L (ref 98–111)
Creatinine, Ser: 0.81 mg/dL (ref 0.44–1.00)
GFR, Estimated: >60 mL/min (ref >60)
Glucose, Bld: 115 mg/dL — ABNORMAL HIGH (ref 70–99)
Potassium: 3.6 mmol/L (ref 3.5–5.1)
Sodium: 135 mmol/L (ref 135–145)
Total Bilirubin: 0.4 mg/dL (ref 0.3–1.2)
Total Protein: 7.2 g/dL (ref 6.5–8.1)

## 2021-09-23 LAB — CBC WITH DIFFERENTIAL/PLATELET
Abs Immature Granulocytes: 0.02 10*3/uL (ref 0.00–0.07)
Basophils Absolute: 0 10*3/uL (ref 0.0–0.1)
Basophils Relative: 0 %
Eosinophils Absolute: 0.5 10*3/uL (ref 0.0–0.5)
Eosinophils Relative: 6 %
HCT: 39.9 % (ref 36.0–46.0)
Hemoglobin: 12.8 g/dL (ref 12.0–15.0)
Immature Granulocytes: 0 %
Lymphocytes Relative: 31 %
Lymphs Abs: 2.8 10*3/uL (ref 0.7–4.0)
MCH: 27.5 pg (ref 26.0–34.0)
MCHC: 32.1 g/dL (ref 30.0–36.0)
MCV: 85.6 fL (ref 80.0–100.0)
Monocytes Absolute: 0.4 10*3/uL (ref 0.1–1.0)
Monocytes Relative: 4 %
Neutro Abs: 5.3 10*3/uL (ref 1.7–7.7)
Neutrophils Relative %: 59 %
Platelets: 397 10*3/uL (ref 150–400)
RBC: 4.66 MIL/uL (ref 3.87–5.11)
RDW: 14.4 % (ref 11.5–15.5)
WBC: 9 10*3/uL (ref 4.0–10.5)
nRBC: 0 % (ref 0.0–0.2)

## 2021-09-23 LAB — ETHANOL: Alcohol, Ethyl (B): <10 mg/dL (ref <10)

## 2021-09-23 LAB — CBG MONITORING, ED: Glucose-Capillary: 122 mg/dL — ABNORMAL HIGH (ref 70–99)

## 2021-09-23 LAB — TROPONIN I (HIGH SENSITIVITY): Troponin I (High Sensitivity): <2 ng/L (ref <18)

## 2021-09-23 LAB — SALICYLATE LEVEL: Salicylate Lvl: <7 mg/dL — ABNORMAL LOW (ref 7.0–30.0)

## 2021-09-23 LAB — ACETAMINOPHEN LEVEL: Acetaminophen (Tylenol), Serum: <10 ug/mL — ABNORMAL LOW (ref 10–30)

## 2021-09-23 NOTE — ED Triage Notes (Signed)
Pt to ED via POV with SO, per first RN pt required sternal rub to awaken patient on arrival to ED. Pt alert and answering some questions at this time. Per patient and SO pt was started on Klonopin but did not take it today, has taken all of her normal medications today. Pt is alert and slow to answer. Pt and SO report intermittent CP in triage. Pt requires repeated encouragement to answer questions.   Pt denies pain in triage, states she feels "all over the place". Pt's SO reports initially she called for feeling off, was slow to answer and slow to walk to car, had syncopal episode en route to hospital.

## 2021-09-23 NOTE — ED Triage Notes (Signed)
FIRST NURSE NOTE:  SO with patient came into lobby requesting help with an unresponsive pt, arrived to car, pt in front seat, not responding to voice, responded to sternal rub.  SO states the pt has been started on anxiety meds recently and states "this is what it did to her"

## 2021-09-24 ENCOUNTER — Emergency Department
Admission: EM | Admit: 2021-09-24 | Discharge: 2021-09-24 | Disposition: A | Discharge status: Home / Self Care | Payer: No Typology Code available for payment source | Attending: Emergency Medicine | Admitting: Emergency Medicine

## 2021-09-24 DIAGNOSIS — F419 Anxiety disorder, unspecified: Secondary | ICD-10-CM

## 2021-09-24 LAB — URINE DRUG SCREEN, QUALITATIVE (ARMC ONLY)
Amphetamines, Ur Screen: NOT DETECTED
Barbiturates, Ur Screen: NOT DETECTED
Benzodiazepine, Ur Scrn: NOT DETECTED
Cannabinoid 50 Ng, Ur ~~LOC~~: POSITIVE — AB
Cocaine Metabolite,Ur ~~LOC~~: NOT DETECTED
MDMA (Ecstasy)Ur Screen: NOT DETECTED
Methadone Scn, Ur: NOT DETECTED
Opiate, Ur Screen: NOT DETECTED
Phencyclidine (PCP) Ur S: NOT DETECTED
Tricyclic, Ur Screen: NOT DETECTED

## 2021-09-24 LAB — POC URINE PREG, ED: Preg Test, Ur: NEGATIVE

## 2021-09-24 NOTE — ED Notes (Signed)
Pt ambulatory from lobby to room 6 without difficulty.  Pt alert and oriented at this time.

## 2021-09-24 NOTE — Discharge Instructions (Addendum)
Please take your venlafaxine and Klonopin as prescribed by your doctor.

## 2021-09-24 NOTE — ED Notes (Signed)
Discharge instructions and follow-up info provided to patient. Patient verbalized understanding. Patient ambulated out to the waiting room with a steady gait to wait for her husband.

## 2021-09-24 NOTE — ED Provider Notes (Signed)
[facility] Provider Note    Event Date/Time   First MD Initiated Contact with Patient 09/24/21 0024     (approximate)   History   Altered Mental Status   HPI  Lori Bentley is a 36 y.o. female with anxiety, bipolar disorder who presents to the emergency department stating that she felt "out of it" earlier today.  She is unable to describe this further.  She states this is normally what she feels like when her anxiety is worse.  No SI, HI or hallucinations.  Denies fevers, cough, chest pain, shortness of breath, vomiting, diarrhea, dysuria.  Was reportedly unresponsive on arrival.  No head injury, headache, numbness, tingling or weakness.  She is on Effexor for her anxiety and was just given a prescription for Klonopin which she states she has not started.   History provided by patient.    Past Medical History:  Diagnosis Date   Anxiety    Asthma    WELL CONTROLLED   Bipolar affective disorder, currently manic, mild (HCC)    Chronic anemia    Depression    Diabetes ([place]) 12/2017   patient denies diabetes   GERD (gastroesophageal reflux disease)    History of blood transfusion 2018   GI BLEED   History of hiatal hernia     Past Surgical History:  Procedure Laterality Date   COLONOSCOPY WITH PROPOFOL N/A 09/18/2019   Procedure: COLONOSCOPY WITH PROPOFOL;  Surgeon: [name], MD;  Location: [facility] ENDOSCOPY;  Service: Gastroenterology;  Laterality: N/A;   ESOPHAGOGASTRODUODENOSCOPY (EGD) WITH PROPOFOL N/A 05/18/2016   Procedure: ESOPHAGOGASTRODUODENOSCOPY (EGD) WITH PROPOFOL;  Surgeon: Manya Silvas, MD;  Location: [facility] ENDOSCOPY;  Service: Endoscopy;  Laterality: N/A;   ESOPHAGOGASTRODUODENOSCOPY (EGD) WITH PROPOFOL N/A 09/18/2019   Procedure: ESOPHAGOGASTRODUODENOSCOPY (EGD) WITH PROPOFOL;  Surgeon: [name], MD;  Location: [facility] ENDOSCOPY;  Service: Gastroenterology;  Laterality: N/A;   ESOPHAGOGASTRODUODENOSCOPY (EGD) WITH  PROPOFOL N/A 08/18/2021   Procedure: ESOPHAGOGASTRODUODENOSCOPY (EGD) WITH PROPOFOL;  Surgeon: [name], MD;  Location: [facility] ENDOSCOPY;  Service: Gastroenterology;  Laterality: N/A;   GIVENS CAPSULE STUDY N/A 11/09/2019   Procedure: GIVENS CAPSULE STUDY;  Surgeon: [name], MD;  Location: [facility] ENDOSCOPY;  Service: Gastroenterology;  Laterality: N/A;   MYOMECTOMY N/A 01/02/2018   Procedure: MYOMECTOMY;  Surgeon: Gae Dry, MD;  Location: [facility] ORS;  Service: Gynecology;  Laterality: N/A;   TUBAL LIGATION  2012    MEDICATIONS:  Prior to Admission medications   Medication Sig Start Date End Date Taking? Authorizing Provider  albuterol (PROVENTIL HFA;VENTOLIN HFA) 108 (90 Base) MCG/ACT inhaler Inhale 2 puffs into the lungs every 6 (six) hours as needed for wheezing or shortness of breath. 11/29/17   Darel Hong, MD  ferrous sulfate 325 (65 FE) MG tablet Take 1 tablet (325 mg total) by mouth daily. Patient not taking: Reported on 07/06/2021 02/06/21 05/16/21  Merlyn Lot, MD  FLOVENT HFA 110 MCG/ACT inhaler Inhale 2 puffs into the lungs in the morning and at bedtime. 01/11/21   [provider]  hydrALAZINE (APRESOLINE) 25 MG tablet Take by mouth. Patient not taking: Reported on 05/16/2021    [provider]  loratadine (CLARITIN) 10 MG tablet Take 1 tablet by mouth daily. Patient not taking: Reported on 05/16/2021 07/05/14   [provider]  metoCLOPramide (REGLAN) 10 MG tablet Take 1 tablet (10 mg total) by mouth 3 (three) times daily with meals. 07/20/21 07/20/22  Merlyn Lot, MD  mupirocin cream (BACTROBAN) 2 % Apply  to affected area 3 times daily 07/06/21 07/06/22  Naaman Plummer, MD  ondansetron (ZOFRAN) 4 MG tablet Take 1 tablet (4 mg total) by mouth daily as needed for nausea or vomiting. Patient not taking: Reported on 07/06/2021 07/06/21 07/06/22  Naaman Plummer, MD  pantoprazole (PROTONIX) 40 MG tablet TAKE 1 TABLET BY MOUTH DAILY 04/11/20   [name], MD  potassium chloride SA (KLOR-CON M) 20 MEQ tablet Take 1 tablet (20 mEq total) by mouth daily for 5 days. 07/20/21 07/25/21  Merlyn Lot, MD    Physical Exam   Triage Vital Signs: ED Triage Vitals  Enc Vitals Group     BP 09/23/21 2051 (!) 134/97     Pulse Rate 09/23/21 2051 71     Resp 09/23/21 2051 18     Temp 09/23/21 2051 98.8 F (37.1 C)     Temp Source 09/23/21 2051 Oral     SpO2 09/23/21 2051 94 %     Weight 09/23/21 2055 187 lb (84.8 kg)     Height 09/23/21 2055 5' (1.524 m)     Head Circumference --      Peak Flow --      Pain Score 09/23/21 2055 0     Pain Loc --      Pain Edu? --      Excl. in [place]? --     Most recent vital signs: Vitals:   09/24/21 0100 09/24/21 0153  BP: 113/79 118/81  Pulse: 69 65  Resp: 16 16  Temp:    SpO2: 98% 100%    CONSTITUTIONAL: Alert and oriented and responds appropriately to questions. Well-appearing; well-nourished HEAD: Normocephalic, atraumatic EYES: Conjunctivae clear, pupils appear equal, sclera nonicteric ENT: normal nose; moist mucous membranes NECK: Supple, normal ROM CARD: RRR; S1 and S2 appreciated; no murmurs, no clicks, no rubs, no gallops RESP: Normal chest excursion without splinting or tachypnea; breath sounds clear and equal bilaterally; no wheezes, no rhonchi, no rales, no hypoxia or respiratory distress, speaking full sentences ABD/GI: Normal bowel sounds; non-distended; soft, non-tender, no rebound, no guarding, no peritoneal signs BACK: The back appears normal EXT: Normal ROM in all joints; no deformity noted, no edema; no cyanosis SKIN: Normal color for age and race; warm; no rash on exposed skin NEURO: Moves all extremities equally, normal speech, normal sensation diffusely, cranial nerves II through XII intact PSYCH: The patient's mood and manner are appropriate.  No SI or HI.  Not responding to internal stimuli.   ED Results / Procedures / Treatments   LABS: (all labs ordered are  listed, but only abnormal results are displayed) Labs Reviewed  COMPREHENSIVE METABOLIC PANEL - Abnormal; Notable for the following components:      Result Value   Glucose, Bld 115 (*)    All other components within normal limits  SALICYLATE LEVEL - Abnormal; Notable for the following components:   Salicylate Lvl <7.9 (*)    All other components within normal limits  ACETAMINOPHEN LEVEL - Abnormal; Notable for the following components:   Acetaminophen (Tylenol), Serum <10 (*)    All other components within normal limits  URINE DRUG SCREEN, QUALITATIVE ([facility] ONLY) - Abnormal; Notable for the following components:   Cannabinoid 50 Ng, Ur [place] POSITIVE (*)    All other components within normal limits  CBG MONITORING, ED - Abnormal; Notable for the following components:   Glucose-Capillary 122 (*)    All other components within normal limits  ETHANOL  CBC WITH DIFFERENTIAL/PLATELET  POC URINE PREG, ED  TROPONIN I (HIGH SENSITIVITY)     EKG:  EKG Interpretation  Date/Time:  Saturday September 23 2021 20:57:50 EST Ventricular Rate:  73 PR Interval:  108 QRS Duration: 76 QT Interval:  408 QTC Calculation: 449 R Axis:   50 Text Interpretation: Unusual P axis, possible ectopic atrial rhythm Nonspecific T wave abnormality Abnormal ECG When compared with ECG of 07-Sep-2021 19:38, Ectopic atrial rhythm has replaced Sinus rhythm Confirmed by Pryor Curia [phone]) on 09/24/2021 7:32:57 AM         RADIOLOGY: My personal review and interpretation of imaging: CT head shows no acute abnormality.  I have personally reviewed all radiology reports.   CT HEAD WO CONTRAST (5MM)  Result Date: 09/23/2021 CLINICAL DATA:  Mental status change, unknown cause EXAM: CT HEAD WITHOUT CONTRAST TECHNIQUE: Contiguous axial images were obtained from the base of the skull through the vertex without intravenous contrast. RADIATION DOSE REDUCTION: This exam was performed according to the departmental  dose-optimization program which includes automated exposure control, adjustment of the mA and/or kV according to patient size and/or use of iterative reconstruction technique. COMPARISON:  None. FINDINGS: Brain: No acute intracranial abnormality. Specifically, no hemorrhage, hydrocephalus, mass lesion, acute infarction, or significant intracranial injury. Vascular: No hyperdense vessel or unexpected calcification. Skull: No acute calvarial abnormality. Sinuses/Orbits: No acute findings Other: None IMPRESSION: Normal study. Electronically Signed   By: Rolm Baptise M.D.   On: 09/23/2021 23:30     PROCEDURES:  Critical Care performed: No   CRITICAL CARE Performed by: Cyril Mourning Monicia Tse   Total critical care time: 0 minutes  Critical care time was exclusive of separately billable procedures and treating other patients.  Critical care was necessary to treat or prevent imminent or life-threatening deterioration.  Critical care was time spent personally by me on the following activities: development of treatment plan with patient and/or surrogate as well as nursing, discussions with consultants, evaluation of patient's response to treatment, examination of patient, obtaining history from patient or surrogate, ordering and performing treatments and interventions, ordering and review of laboratory studies, ordering and review of radiographic studies, pulse oximetry and re-evaluation of patient's condition.   Marland Kitchen1-3 Lead EKG Interpretation Performed by: Soleia Badolato, Delice Bison, DO Authorized by: Arvo Ealy, Delice Bison, DO     Interpretation: normal     ECG rate:  66   ECG rate assessment: normal     Rhythm: sinus rhythm     Ectopy: none     Conduction: normal      IMPRESSION / MDM / ASSESSMENT AND PLAN / ED COURSE  I reviewed the triage vital signs and the nursing notes.    Patient here with complaints of feeling "out of it" and states that her anxiety was "acting up".  Now asymptomatic.  Reportedly was  unresponsive by her husband's report.  The patient is on the cardiac monitor to evaluate for evidence of arrhythmia and/or significant heart rate changes.   DIFFERENTIAL DIAGNOSIS (includes but not limited to):   Anxiety, depression, anemia, electrolyte derangement, medication side effect, hypoglycemia, intracranial hemorrhage, CVA, metabolic encephalopathy, intoxication.   PLAN: We will obtain CBC, CMP, CT head, ethanol level, urine drug screen, EKG.   MEDICATIONS GIVEN IN ED: Medications - No data to display   ED COURSE: Patient's work-up has been reassuring.  Normal electrolytes, hemoglobin, negative troponin, negative ethanol, Tylenol and salicylate levels.  Drug screen positive for cannabinoids.  CT head reviewed by myself and radiologist and shows no acute abnormality.  On my evaluation of the patient she is neurologically intact and has no acute complaints.  I suspect that her symptoms are due to anxiety and she agrees.  She has no SI, HI or hallucinations and I do not feel she needs emergent psychiatric evaluation.  She is requesting something to help with her anxiety however she states she was just given a prescription for Klonopin which she has with her that she has not yet started.  Discussed with patient that I feel it is reasonable for her to start this medication and follow-up closely with her doctor.  Her EKG is nonischemic today.  It shows no acute change compared to previous.  No event seen on cardiac monitoring.  I feel she is safe for discharge home.   At this time, I do not feel there is any life-threatening condition present. I reviewed all nursing notes, vitals, pertinent previous records.  All lab and urine results, EKGs, imaging ordered have been independently reviewed and interpreted by myself.  I reviewed all available radiology reports from any imaging ordered this visit.  Based on my assessment, I feel the patient is safe to be discharged home without further emergent  workup and can continue workup as an outpatient as needed. Discussed all findings, treatment plan as well as usual and customary return precautions with patient.  They verbalize understanding and are comfortable with this plan.  Outpatient follow-up has been provided as needed.  All questions have been answered.    CONSULTS: No emergent psychiatric consult needed at this time given no SI or HI, mania or psychosis.   OUTSIDE RECORDS REVIEWED: Reviewed patient's recent admission on 08/26/2021 for anemia.         FINAL CLINICAL IMPRESSION(S) / ED DIAGNOSES   Final diagnoses:  Anxiety     Rx / DC Orders   ED Discharge Orders     None        Note:  This document was prepared using Dragon voice recognition software and may include unintentional dictation errors.   Oisin Yoakum, Delice Bison, DO 09/24/21 [phone]

## 2021-10-23 ENCOUNTER — Ambulatory Visit: Payer: Medicaid Other | Admitting: Gastroenterology

## 2021-10-23 ENCOUNTER — Encounter: Payer: Self-pay | Admitting: Gastroenterology

## 2021-11-03 ENCOUNTER — Encounter: Payer: Self-pay | Admitting: Radiology

## 2021-11-03 ENCOUNTER — Emergency Department
Admission: EM | Admit: 2021-11-03 | Discharge: 2021-11-03 | Disposition: A | Discharge status: Home / Self Care | Payer: Medicaid Other | Attending: Emergency Medicine | Admitting: Emergency Medicine

## 2021-11-03 ENCOUNTER — Other Ambulatory Visit: Payer: Self-pay

## 2021-11-03 ENCOUNTER — Emergency Department: Payer: Medicaid Other

## 2021-11-03 DIAGNOSIS — E876 Hypokalemia: Secondary | ICD-10-CM | POA: Diagnosis not present

## 2021-11-03 DIAGNOSIS — J45909 Unspecified asthma, uncomplicated: Secondary | ICD-10-CM | POA: Insufficient documentation

## 2021-11-03 DIAGNOSIS — K449 Diaphragmatic hernia without obstruction or gangrene: Secondary | ICD-10-CM

## 2021-11-03 DIAGNOSIS — R109 Unspecified abdominal pain: Secondary | ICD-10-CM | POA: Diagnosis present

## 2021-11-03 DIAGNOSIS — K2900 Acute gastritis without bleeding: Secondary | ICD-10-CM | POA: Diagnosis not present

## 2021-11-03 DIAGNOSIS — F17200 Nicotine dependence, unspecified, uncomplicated: Secondary | ICD-10-CM | POA: Insufficient documentation

## 2021-11-03 LAB — COMPREHENSIVE METABOLIC PANEL WITH GFR
ALT: 17 U/L (ref 0–44)
AST: 22 U/L (ref 15–41)
Albumin: 4 g/dL (ref 3.5–5.0)
Alkaline Phosphatase: 65 U/L (ref 38–126)
Anion gap: 10 (ref 5–15)
BUN: 7 mg/dL (ref 6–20)
CO2: 24 mmol/L (ref 22–32)
Calcium: 8.6 mg/dL — ABNORMAL LOW (ref 8.9–10.3)
Chloride: 103 mmol/L (ref 98–111)
Creatinine, Ser: 0.74 mg/dL (ref 0.44–1.00)
GFR, Estimated: >60 mL/min
Glucose, Bld: 136 mg/dL — ABNORMAL HIGH (ref 70–99)
Potassium: 3.1 mmol/L — ABNORMAL LOW (ref 3.5–5.1)
Sodium: 137 mmol/L (ref 135–145)
Total Bilirubin: 0.5 mg/dL (ref 0.3–1.2)
Total Protein: 7.6 g/dL (ref 6.5–8.1)

## 2021-11-03 LAB — CBC WITH DIFFERENTIAL/PLATELET
Abs Immature Granulocytes: 0.06 10*3/uL (ref 0.00–0.07)
Basophils Absolute: 0 10*3/uL (ref 0.0–0.1)
Basophils Relative: 0 %
Eosinophils Absolute: 0.5 10*3/uL (ref 0.0–0.5)
Eosinophils Relative: 3 %
HCT: 40.2 % (ref 36.0–46.0)
Hemoglobin: 12.8 g/dL (ref 12.0–15.0)
Immature Granulocytes: 0 %
Lymphocytes Relative: 16 %
Lymphs Abs: 2.3 10*3/uL (ref 0.7–4.0)
MCH: 26.7 pg (ref 26.0–34.0)
MCHC: 31.8 g/dL (ref 30.0–36.0)
MCV: 83.9 fL (ref 80.0–100.0)
Monocytes Absolute: 0.7 10*3/uL (ref 0.1–1.0)
Monocytes Relative: 5 %
Neutro Abs: 11.3 10*3/uL — ABNORMAL HIGH (ref 1.7–7.7)
Neutrophils Relative %: 76 %
Platelets: 446 10*3/uL — ABNORMAL HIGH (ref 150–400)
RBC: 4.79 MIL/uL (ref 3.87–5.11)
RDW: 13.4 % (ref 11.5–15.5)
WBC: 14.8 10*3/uL — ABNORMAL HIGH (ref 4.0–10.5)
nRBC: 0 % (ref 0.0–0.2)

## 2021-11-03 LAB — URINALYSIS, COMPLETE (UACMP) WITH MICROSCOPIC
Bilirubin Urine: NEGATIVE
Glucose, UA: NEGATIVE mg/dL
Hgb urine dipstick: NEGATIVE
Ketones, ur: NEGATIVE mg/dL
Nitrite: NEGATIVE
Protein, ur: 100 mg/dL — AB
Specific Gravity, Urine: 1.025 (ref 1.005–1.030)
Squamous Epithelial / HPF: >50 — ABNORMAL HIGH (ref 0–5)
pH: 5 (ref 5.0–8.0)

## 2021-11-03 LAB — POC URINE PREG, ED: Preg Test, Ur: NEGATIVE

## 2021-11-03 LAB — MAGNESIUM: Magnesium: 1.9 mg/dL (ref 1.7–2.4)

## 2021-11-03 LAB — TROPONIN I (HIGH SENSITIVITY): Troponin I (High Sensitivity): 6 ng/L (ref <18)

## 2021-11-03 LAB — LIPASE, BLOOD: Lipase: 47 U/L (ref 11–51)

## 2021-11-03 MED ORDER — LORAZEPAM 2 MG/ML IJ SOLN
1.0000 mg | Freq: Once | INTRAMUSCULAR | Status: AC
  Administered 2021-11-03: 1 mg via INTRAVENOUS
  Filled 2021-11-03: qty 1

## 2021-11-03 MED ORDER — ALUM & MAG HYDROXIDE-SIMETH 200-200-20 MG/5ML PO SUSP
30.0000 mL | Freq: Once | ORAL | Status: AC
  Administered 2021-11-03: 30 mL via ORAL
  Filled 2021-11-03: qty 30

## 2021-11-03 MED ORDER — FENTANYL CITRATE PF 50 MCG/ML IJ SOSY
25.0000 ug | PREFILLED_SYRINGE | Freq: Once | INTRAMUSCULAR | Status: DC
  Filled 2021-11-03: qty 1

## 2021-11-03 MED ORDER — ONDANSETRON 4 MG PO TBDP: 4.0000 mg | ORAL_TABLET | Freq: Three times a day (TID) | ORAL | 0 refills | Status: AC | PRN

## 2021-11-03 MED ORDER — POTASSIUM CHLORIDE CRYS ER 20 MEQ PO TBCR
40.0000 meq | EXTENDED_RELEASE_TABLET | Freq: Once | ORAL | Status: AC
  Administered 2021-11-03: 40 meq via ORAL
  Filled 2021-11-03: qty 2

## 2021-11-03 MED ORDER — ONDANSETRON 4 MG PO TBDP: 4.0000 mg | ORAL_TABLET | Freq: Three times a day (TID) | ORAL | 0 refills | Status: DC | PRN

## 2021-11-03 MED ORDER — PANTOPRAZOLE SODIUM 40 MG IV SOLR
40.0000 mg | Freq: Once | INTRAVENOUS | Status: AC
  Administered 2021-11-03: 40 mg via INTRAVENOUS
  Filled 2021-11-03: qty 10

## 2021-11-03 MED ORDER — SUCRALFATE 1 G PO TABS: 1.0000 g | ORAL_TABLET | Freq: Four times a day (QID) | ORAL | 0 refills | Status: DC

## 2021-11-03 MED ORDER — SUCRALFATE 1 G PO TABS
1.0000 g | ORAL_TABLET | Freq: Once | ORAL | Status: AC
Start: 2021-11-03 — End: 2021-11-03
  Administered 2021-11-03: 1 g via ORAL
  Filled 2021-11-03: qty 1

## 2021-11-03 MED ORDER — ONDANSETRON HCL 4 MG/2ML IJ SOLN
4.0000 mg | Freq: Once | INTRAMUSCULAR | Status: AC
  Administered 2021-11-03: 4 mg via INTRAVENOUS
  Filled 2021-11-03: qty 2

## 2021-11-03 MED ORDER — LACTATED RINGERS IV BOLUS
1000.0000 mL | Freq: Once | INTRAVENOUS | Status: AC
Start: 2021-11-03 — End: 2021-11-03
  Administered 2021-11-03: 1000 mL via INTRAVENOUS

## 2021-11-03 MED ORDER — IOHEXOL 300 MG/ML  SOLN
100.0000 mL | Freq: Once | INTRAMUSCULAR | Status: AC | PRN
  Administered 2021-11-03: 100 mL via INTRAVENOUS

## 2021-11-03 MED ORDER — POTASSIUM CHLORIDE 10 MEQ/100ML IV SOLN
10.0000 meq | INTRAVENOUS | Status: AC
  Administered 2021-11-03 (×2): 10 meq via INTRAVENOUS
  Filled 2021-11-03: qty 100

## 2021-11-03 NOTE — ED Provider Notes (Signed)
? ?[facility] ?Provider Note ? ? ? Event Date/Time  ? First MD Initiated Contact with Patient 11/03/21 [phone]   ?  (approximate) ? ? ?History  ? ?Abdominal Pain ? ? ?HPI ? ?Lori Bentley [place] is a 36 y.o. female with a past medical history of anxiety, asthma, poor disorder, chronic anemia, depression, GERD and a hiatal hernia who presents via EMS for evaluation of nausea, vomiting and abdominal pain starting this morning.  Patient states she felt okay going to bed last night although has been having some worsening anxiety.  She states she had tacos for dinner last night.  No recent travel.  She denies any chest pain, cough, shortness of breath, headache, earache, sore throat, back pain, urinary symptoms, diarrhea, constipation or rash or extremity pain.  No recent EtOH use or illicit drug use aside from some intermittent THC use.  She endorses smoking Black and milds.  No clear alleviating aggravating factors.  She think she may have taken Protonix this morning but is not sure. ? ?  ? ? ?Physical Exam  ?Triage Vital Signs: ?ED Triage Vitals  ?Enc Vitals Group  ?   BP   ?   Pulse   ?   Resp   ?   Temp   ?   Temp src   ?   SpO2   ?   Weight   ?   Height   ?   Head Circumference   ?   Peak Flow   ?   Pain Score   ?   Pain Loc   ?   Pain Edu?   ?   Excl. in [place]?   ? ? ?Most recent vital signs: ?Vitals:  ? 11/03/21 1000 11/03/21 1100  ?BP: 118/87 110/84  ?Pulse: 82 85  ?Resp: 18 13  ?Temp:    ?SpO2: 98% 98%  ? ? ?General: Awake, appears uncomfortable. ?CV:  Good peripheral perfusion.  2+ radial pulse. ?Resp:  Normal effort.  Clear bilaterally. ?Abd:  No distention.  Mildly tender throughout without any rigidity or guarding. ?Other:  Dry mucous membranes. ? ? ?ED Results / Procedures / Treatments  ?Labs ?(all labs ordered are listed, but only abnormal results are displayed) ?Labs Reviewed  ?CBC WITH DIFFERENTIAL/PLATELET - Abnormal; Notable for the following components:  ?    Result Value  ? WBC  14.8 (*)   ? Platelets 446 (*)   ? Neutro Abs 11.3 (*)   ? All other components within normal limits  ?COMPREHENSIVE METABOLIC PANEL - Abnormal; Notable for the following components:  ? Potassium 3.1 (*)   ? Glucose, Bld 136 (*)   ? Calcium 8.6 (*)   ? All other components within normal limits  ?URINALYSIS, COMPLETE (UACMP) WITH MICROSCOPIC - Abnormal; Notable for the following components:  ? Color, Urine AMBER (*)   ? APPearance CLOUDY (*)   ? Protein, ur 100 (*)   ? Leukocytes,Ua TRACE (*)   ? Bacteria, UA RARE (*)   ? Squamous Epithelial / LPF >50 (*)   ? All other components within normal limits  ?URINE CULTURE  ?LIPASE, BLOOD  ?MAGNESIUM  ?POC URINE PREG, ED  ?TROPONIN I (HIGH SENSITIVITY)  ? ? ? ?EKG ? ?EKG is remarkable for sinus rhythm with a ventricular rate of 89, normal axis, unremarkable intervals.  Diffuse nonspecific T wave changes in inferior and lateral leads V3 and V4. No other clear evidence of acute ischemia or significant arrhythmia. ? ? ?RADIOLOGY ? ? ?  CT abdomen pelvis my interpretation shows a hiatal hernia without evidence of perforated viscus, appendicitis, cholecystitis, hepatitis, diverticulitis, kidney stone, perinephric stranding or any other acute abdominal or pelvic process.  I also reviewed radiology capitation and agree with the findings of same. ? ?PROCEDURES: ? ?Critical Care performed: No ? ?.1-3 Lead EKG Interpretation ?Performed by: Lucrezia Starch, MD ?Authorized by: Lucrezia Starch, MD  ? ?  Interpretation: normal   ?  ECG rate assessment: normal   ?  Rhythm: sinus rhythm   ?  Ectopy: none   ?  Conduction: normal   ? ?The patient is on the cardiac monitor to evaluate for evidence of arrhythmia and/or significant heart rate changes. ? ? ?MEDICATIONS ORDERED IN ED: ?Medications  ?fentaNYL (SUBLIMAZE) injection 25 mcg (25 mcg Intravenous Patient Refused/Not Given 11/03/21 0932)  ?potassium chloride 10 mEq in 100 mL IVPB (10 mEq Intravenous New Bag/Given 11/03/21 1057)  ?potassium  chloride SA (KLOR-CON M) CR tablet 40 mEq (has no administration in time range)  ?lactated ringers bolus 1,000 mL (1,000 mLs Intravenous New Bag/Given 11/03/21 0917)  ?ondansetron [facility]) injection 4 mg (4 mg Intravenous Given 11/03/21 0917)  ?pantoprazole (PROTONIX) injection 40 mg (40 mg Intravenous Given 11/03/21 0917)  ?LORazepam (ATIVAN) injection 1 mg (1 mg Intravenous Given 11/03/21 0934)  ?iohexol (OMNIPAQUE) 300 MG/ML solution 100 mL (100 mLs Intravenous Contrast Given 11/03/21 1010)  ?sucralfate (CARAFATE) tablet 1 g (1 g Oral Given 11/03/21 1101)  ?alum & mag hydroxide-simeth (MAALOX/MYLANTA) 200-200-20 MG/5ML suspension 30 mL (30 mLs Oral Given 11/03/21 1101)  ? ? ? ?IMPRESSION / MDM / ASSESSMENT AND PLAN / ED COURSE  ?I reviewed the triage vital signs and the nursing notes. ?             ?               ? ?Differential diagnosis includes, but is not limited to acute infectious gastritis, peptic ulcer disease, cholecystitis, pancreatitis, appendicitis, diverticulitis, cystitis, atypical angina anemia, metabolic derangements possible kidney stone. ? ?EKG is remarkable for sinus rhythm with a ventricular rate of 89, normal axis, unremarkable intervals.  Diffuse nonspecific T wave changes in inferior and lateral leads V3 and V4. No other clear evidence of acute ischemia or significant arrhythmia.  Given nonelevated troponin obtained greater than 3 hours after symptom onset of a very low suspicion for ACS. ? ?Pregnancy test is negative.  Lipase not suggestive of pancreatitis.  CMP is remarkable for potassium 3.1 without any other significant electrolyte or metabolic derangements.  No evidence of hepatitis or cholestatic process.  BC shows WBC count of 14.8 without evidence of acute anemia and platelets of 446.  UA has greater than 50 squamous of the cells concerning for possible contamination with some bacteria noted and 6-10 WBCs as well as trace leukocyte esterase.  Will defer antibiotics as patient has no urinary  symptoms although urine culture sent. ? ?CT abdomen pelvis my interpretation shows a hiatal hernia without evidence of perforated viscus, appendicitis, cholecystitis, hepatitis, diverticulitis, kidney stone, perinephric stranding or any other acute abdominal or pelvic process.  I also reviewed radiology capitation and agree with the findings of same. ? ?I suspect likely acute gastritis possibly infectious in the setting of hiatal hernia.  Patient given some IV fluids Carafate, Maalox potassium and on reassessment is feeling much better and able tolerate p.o.  I considered observation given stable vitals with low suspicion for other immediate life-threatening process and patient able tolerate p.o. I  think she is stable for discharge with outpatient follow-up.  Rx written for Zofran and Protonix.  Advised her to continue taking her Protonix.  Potassium was repleted prior to discharge.  Advised to follow-up with PCP to have potassium rechecked on 4/10.  Discharged in stable condition. ? ?  ? ? ?FINAL CLINICAL IMPRESSION(S) / ED DIAGNOSES  ? ?Final diagnoses:  ?Acute gastritis without hemorrhage, unspecified gastritis type  ?Hiatal hernia  ?Hypokalemia  ? ? ? ?Rx / DC Orders  ? ?ED Discharge Orders   ? ?      Ordered  ?  ondansetron (ZOFRAN-ODT) 4 MG disintegrating tablet  Every 8 hours PRN,   Status:  Discontinued       ? 11/03/21 1131  ?  sucralfate (CARAFATE) 1 g tablet  4 times daily       ? 11/03/21 1131  ?  ondansetron (ZOFRAN-ODT) 4 MG disintegrating tablet  Every 8 hours PRN       ? 11/03/21 1134  ? ?  ?  ? ?  ? ? ? ?Note:  This document was prepared using Dragon voice recognition software and may include unintentional dictation errors. ?  ?Lucrezia Starch, MD ?11/03/21 1137 ? ?

## 2021-11-03 NOTE — ED Notes (Signed)
Pt is refusing fentanyl at this time. I will hold for if she changes her mind. She did state she is feeling anxious and takes clonazepam 0.'5mg'$  at home when feeling anxious. Will make MD aware.  ?

## 2021-11-03 NOTE — ED Triage Notes (Addendum)
BIB EMS from home for abdominl pain. Pt stood and ambulated to hospital bed from EMS bed. Nausea and vomiting x 5 this morning. Pt advised she made tacos for dinner last night that were leftover from several days ago. Pt is CAOx4 and answering all questions at this time.  ?97.5 T ?151/95 ?100 HR ?97% on RA ?BGL 134 ? ? ?

## 2021-11-04 LAB — URINE CULTURE

## 2021-11-05 ENCOUNTER — Emergency Department
Admission: EM | Admit: 2021-11-05 | Discharge: 2021-11-06 | Disposition: A | Discharge status: Home / Self Care | Payer: Medicaid Other | Attending: Emergency Medicine | Admitting: Emergency Medicine

## 2021-11-05 DIAGNOSIS — K219 Gastro-esophageal reflux disease without esophagitis: Secondary | ICD-10-CM | POA: Diagnosis not present

## 2021-11-05 DIAGNOSIS — R4182 Altered mental status, unspecified: Secondary | ICD-10-CM | POA: Insufficient documentation

## 2021-11-05 DIAGNOSIS — K2950 Unspecified chronic gastritis without bleeding: Secondary | ICD-10-CM | POA: Diagnosis not present

## 2021-11-05 DIAGNOSIS — R1012 Left upper quadrant pain: Secondary | ICD-10-CM | POA: Diagnosis present

## 2021-11-05 DIAGNOSIS — D649 Anemia, unspecified: Secondary | ICD-10-CM | POA: Diagnosis not present

## 2021-11-05 DIAGNOSIS — K449 Diaphragmatic hernia without obstruction or gangrene: Secondary | ICD-10-CM | POA: Diagnosis not present

## 2021-11-05 DIAGNOSIS — E119 Type 2 diabetes mellitus without complications: Secondary | ICD-10-CM | POA: Diagnosis not present

## 2021-11-05 DIAGNOSIS — F419 Anxiety disorder, unspecified: Secondary | ICD-10-CM | POA: Diagnosis not present

## 2021-11-05 LAB — CBC
HCT: 36.7 % (ref 36.0–46.0)
Hemoglobin: 11.4 g/dL — ABNORMAL LOW (ref 12.0–15.0)
MCH: 26.3 pg (ref 26.0–34.0)
MCHC: 31.1 g/dL (ref 30.0–36.0)
MCV: 84.8 fL (ref 80.0–100.0)
Platelets: 399 10*3/uL (ref 150–400)
RBC: 4.33 MIL/uL (ref 3.87–5.11)
RDW: 13.5 % (ref 11.5–15.5)
WBC: 8.4 10*3/uL (ref 4.0–10.5)
nRBC: 0 % (ref 0.0–0.2)

## 2021-11-05 LAB — COMPREHENSIVE METABOLIC PANEL
ALT: 16 U/L (ref 0–44)
AST: 20 U/L (ref 15–41)
Albumin: 3.8 g/dL (ref 3.5–5.0)
Alkaline Phosphatase: 60 U/L (ref 38–126)
Anion gap: 7 (ref 5–15)
BUN: 6 mg/dL (ref 6–20)
CO2: 26 mmol/L (ref 22–32)
Calcium: 8.7 mg/dL — ABNORMAL LOW (ref 8.9–10.3)
Chloride: 106 mmol/L (ref 98–111)
Creatinine, Ser: 0.97 mg/dL (ref 0.44–1.00)
GFR, Estimated: >60 mL/min (ref >60)
Glucose, Bld: 87 mg/dL (ref 70–99)
Potassium: 3.7 mmol/L (ref 3.5–5.1)
Sodium: 139 mmol/L (ref 135–145)
Total Bilirubin: 0.3 mg/dL (ref 0.3–1.2)
Total Protein: 7.6 g/dL (ref 6.5–8.1)

## 2021-11-05 LAB — URINALYSIS, ROUTINE W REFLEX MICROSCOPIC
Bilirubin Urine: NEGATIVE
Glucose, UA: NEGATIVE mg/dL
Hgb urine dipstick: NEGATIVE
Ketones, ur: NEGATIVE mg/dL
Leukocytes,Ua: NEGATIVE
Nitrite: NEGATIVE
Protein, ur: NEGATIVE mg/dL
Specific Gravity, Urine: 1.023 (ref 1.005–1.030)
pH: 6 (ref 5.0–8.0)

## 2021-11-05 LAB — LIPASE, BLOOD: Lipase: 45 U/L (ref 11–51)

## 2021-11-05 LAB — POC URINE PREG, ED: Preg Test, Ur: NEGATIVE

## 2021-11-05 NOTE — ED Provider Notes (Signed)
? ?[facility] ?Provider Note ? ? ? Event Date/Time  ? First MD Initiated Contact with Patient 11/05/21 2323   ?  (approximate) ? ? ?History  ? ?Chief Complaint ?Abdominal Pain and Anxiety ? ? ?HPI ? ?Lori Bentley is a 36 y.o. female with past medical history of diabetes, iron deficiency anemia, fibroids, and bipolar disorder who presents to the ED complaining of abdominal pain and anxiety.  Patient reports that she has been dealing with intermittent pain in her left upper quadrant for the past couple of weeks.  She was initially seen for this pain in the ED 2 days ago, when CT was performed and remarkable only for hiatal hernia.  She was told to increase her Protonix to twice daily but states she has had no relief since then.  She continues to complain of sharp pain in the left upper quadrant, denies any associated nausea, vomiting, dysuria, flank pain, or changes in bowel movements.  She denies taking anything beyond Protonix for her pain. She states she has been seen by GI with endoscopy in the past, was told she would likely need a surgery, but is not sure who to go to for this.  She additionally reports increased anxiety lately, denies any suicidal or homicidal ideation.  She states she is prescribed venlafaxine along with Klonopin by her psychiatry team, but reports this has not been helping her symptoms. ?  ? ? ?Physical Exam  ? ?Triage Vital Signs: ?ED Triage Vitals  ?Enc Vitals Group  ?   BP 11/05/21 2045 115/86  ?   Pulse Rate 11/05/21 2045 83  ?   Resp 11/05/21 2045 16  ?   Temp 11/05/21 2045 98.9 ?F (37.2 ?C)  ?   Temp Source 11/05/21 2045 Oral  ?   SpO2 11/05/21 2045 97 %  ?   Weight 11/05/21 2045 189 lb 9.5 oz (86 kg)  ?   Height --   ?   Head Circumference --   ?   Peak Flow --   ?   Pain Score 11/05/21 2045 10  ?   Pain Loc --   ?   Pain Edu? --   ?   Excl. in [place]? --   ? ? ?Most recent vital signs: ?Vitals:  ? 11/05/21 2045  ?BP: 115/86  ?Pulse: 83  ?Resp: 16   ?Temp: 98.9 ?F (37.2 ?C)  ?SpO2: 97%  ? ? ?Constitutional: Alert and oriented. ?Eyes: Conjunctivae are normal. ?Head: Atraumatic. ?Nose: No congestion/rhinnorhea. ?Mouth/Throat: Mucous membranes are moist. ?Cardiovascular: Normal rate, regular rhythm. Grossly normal heart sounds.  2+ radial pulses bilaterally. ?Respiratory: Normal respiratory effort.  No retractions. Lungs CTAB. ?Gastrointestinal: Soft and nontender. No distention. ?Musculoskeletal: No lower extremity tenderness nor edema.  ?Neurologic:  Normal speech and language. No gross focal neurologic deficits are appreciated. ? ? ? ?ED Results / Procedures / Treatments  ? ?Labs ?(all labs ordered are listed, but only abnormal results are displayed) ?Labs Reviewed  ?COMPREHENSIVE METABOLIC PANEL - Abnormal; Notable for the following components:  ?    Result Value  ? Calcium 8.7 (*)   ? All other components within normal limits  ?CBC - Abnormal; Notable for the following components:  ? Hemoglobin 11.4 (*)   ? All other components within normal limits  ?URINALYSIS, ROUTINE W REFLEX MICROSCOPIC - Abnormal; Notable for the following components:  ? Color, Urine YELLOW (*)   ? APPearance HAZY (*)   ? All other components within normal  limits  ?LIPASE, BLOOD  ?POC URINE PREG, ED  ? ? ?PROCEDURES: ? ?Critical Care performed: No ? ?Procedures ? ? ?MEDICATIONS ORDERED IN ED: ?Medications  ?clonazePAM (KLONOPIN) tablet 0.5 mg (has no administration in time range)  ? ? ? ?IMPRESSION / MDM / ASSESSMENT AND PLAN / ED COURSE  ?I reviewed the triage vital signs and the nursing notes. ?             ?               ? ?36 y.o. female with past medical history of diabetes, iron deficiency anemia, fibroids, bipolar disorder who presents to the ED for intermittent LUQ abdominal pain over the past couple of weeks, more severe over the past 2 days.  She additionally complains of acute on chronic anxiety without suicidal or homicidal ideation. ? ?Differential diagnosis includes, but  is not limited to, gastritis, hiatal hernia, GERD, pancreatitis, hepatitis, biliary colic, cholecystitis. ? ?Patient nontoxic-appearing and in no acute distress, vital signs are unremarkable and she has a benign abdominal exam.  CT scan reviewed from visit 2 days ago and is remarkable only for hiatal hernia, given ongoing similar symptoms with reassuring work-up thus far, do not feel repeat CT imaging is indicated today.  Suspect patient with ongoing gastritis related to known hiatal hernia.  CBC with stable chronic anemia and no leukocytosis, BMP without electrolyte abnormality, LFTs and lipase are within normal limits.  UA shows no signs of infection and pregnancy testing is negative.  Patient was offered GI cocktail for gastritis, but declines.  We will prescribe viscous lidocaine for use at home and she was counseled to continue Protonix, also trial Pepcid or Tums as needed.  For her anxiety, she is primarily concerned that she was only prescribed 0.5 mg of clonazepam by her psychiatry team.  I explained that I am unable to change this prescription or prescribe an alternative benzodiazepine for anxiety.  She was counseled to follow-up with her outpatient psychiatrist and to return to the ED for new or worsening symptoms. ? ?  ? ? ?FINAL CLINICAL IMPRESSION(S) / ED DIAGNOSES  ? ?Final diagnoses:  ?Chronic gastritis without bleeding, unspecified gastritis type  ?Hiatal hernia  ? ? ? ?Rx / DC Orders  ? ?ED Discharge Orders   ? ?      Ordered  ?  lidocaine (XYLOCAINE) 2 % solution  As needed       ? 11/06/21 0005  ? ?  ?  ? ?  ? ? ? ?Note:  This document was prepared using Dragon voice recognition software and may include unintentional dictation errors. ?  ?Blake Divine, MD ?11/06/21 0015 ? ?

## 2021-11-05 NOTE — ED Triage Notes (Addendum)
Pt comes pov with abd pain and a hernia. Hx of same. States every time she eats her stomach hurts again. No n/v/d. Hx of gastritis.  Also states her "anxiety is acting up" ?

## 2021-11-05 NOTE — ED Notes (Signed)
Pt given a warm blanket 

## 2021-11-06 ENCOUNTER — Emergency Department: Payer: Medicaid Other

## 2021-11-06 ENCOUNTER — Other Ambulatory Visit: Payer: Self-pay

## 2021-11-06 ENCOUNTER — Emergency Department
Admission: EM | Admit: 2021-11-06 | Discharge: 2021-11-06 | Disposition: A | Discharge status: Home / Self Care | Payer: Medicaid Other | Source: Home / Self Care | Attending: Emergency Medicine | Admitting: Emergency Medicine

## 2021-11-06 ENCOUNTER — Encounter: Payer: Self-pay | Admitting: Emergency Medicine

## 2021-11-06 DIAGNOSIS — R4182 Altered mental status, unspecified: Secondary | ICD-10-CM | POA: Insufficient documentation

## 2021-11-06 DIAGNOSIS — K219 Gastro-esophageal reflux disease without esophagitis: Secondary | ICD-10-CM | POA: Insufficient documentation

## 2021-11-06 DIAGNOSIS — K449 Diaphragmatic hernia without obstruction or gangrene: Secondary | ICD-10-CM | POA: Insufficient documentation

## 2021-11-06 LAB — CBC
HCT: 40.1 % (ref 36.0–46.0)
Hemoglobin: 12.5 g/dL (ref 12.0–15.0)
MCH: 26.4 pg (ref 26.0–34.0)
MCHC: 31.2 g/dL (ref 30.0–36.0)
MCV: 84.6 fL (ref 80.0–100.0)
Platelets: 412 10*3/uL — ABNORMAL HIGH (ref 150–400)
RBC: 4.74 MIL/uL (ref 3.87–5.11)
RDW: 13.2 % (ref 11.5–15.5)
WBC: 12 10*3/uL — ABNORMAL HIGH (ref 4.0–10.5)
nRBC: 0 % (ref 0.0–0.2)

## 2021-11-06 LAB — COMPREHENSIVE METABOLIC PANEL WITH GFR
ALT: 17 U/L (ref 0–44)
AST: 19 U/L (ref 15–41)
Albumin: 4 g/dL (ref 3.5–5.0)
Alkaline Phosphatase: 59 U/L (ref 38–126)
Anion gap: 8 (ref 5–15)
BUN: 9 mg/dL (ref 6–20)
CO2: 25 mmol/L (ref 22–32)
Calcium: 8.6 mg/dL — ABNORMAL LOW (ref 8.9–10.3)
Chloride: 104 mmol/L (ref 98–111)
Creatinine, Ser: 0.75 mg/dL (ref 0.44–1.00)
GFR, Estimated: >60 mL/min
Glucose, Bld: 100 mg/dL — ABNORMAL HIGH (ref 70–99)
Potassium: 3.8 mmol/L (ref 3.5–5.1)
Sodium: 137 mmol/L (ref 135–145)
Total Bilirubin: 0.5 mg/dL (ref 0.3–1.2)
Total Protein: 7.7 g/dL (ref 6.5–8.1)

## 2021-11-06 LAB — LIPASE, BLOOD: Lipase: 49 U/L (ref 11–51)

## 2021-11-06 MED ORDER — METOCLOPRAMIDE HCL 10 MG PO TABS: 10.0000 mg | ORAL_TABLET | Freq: Four times a day (QID) | ORAL | 0 refills | Status: AC | PRN

## 2021-11-06 MED ORDER — DIPHENHYDRAMINE HCL 50 MG/ML IJ SOLN
25.0000 mg | Freq: Once | INTRAMUSCULAR | Status: AC
  Administered 2021-11-06: 25 mg via INTRAVENOUS
  Filled 2021-11-06: qty 1

## 2021-11-06 MED ORDER — METOCLOPRAMIDE HCL 5 MG/ML IJ SOLN
10.0000 mg | INTRAMUSCULAR | Status: AC
  Administered 2021-11-06: 10 mg via INTRAVENOUS
  Filled 2021-11-06: qty 2

## 2021-11-06 MED ORDER — SUCRALFATE 1 G PO TABS: 1.0000 g | ORAL_TABLET | Freq: Four times a day (QID) | ORAL | 1 refills | Status: AC

## 2021-11-06 MED ORDER — LIDOCAINE VISCOUS HCL 2 % MT SOLN: 15.0000 mL | OROMUCOSAL | 0 refills | Status: AC | PRN

## 2021-11-06 MED ORDER — CLONAZEPAM 0.5 MG PO TABS: 0.5000 mg | ORAL_TABLET | Freq: Once | ORAL | Status: DC

## 2021-11-06 MED ORDER — SUCRALFATE 1 G PO TABS
1.0000 g | ORAL_TABLET | Freq: Once | ORAL | Status: AC
  Administered 2021-11-06: 1 g via ORAL
  Filled 2021-11-06: qty 1

## 2021-11-06 NOTE — ED Notes (Addendum)
Went to patient's room to administer medications and provide discharge meds. Patient was not in her room and staff stated she left earlier without making anyone else aware or giving reason. Order for Klonopin will be cancelled ?

## 2021-11-06 NOTE — ED Notes (Signed)
Patient transported to X-ray 

## 2021-11-06 NOTE — ED Triage Notes (Signed)
Pt via POV from home. Pt is accompanied by husband, husband states pt has been having abd pain for couple of weeks, pt has a known hernia. Pt was seen here on 4/9 and 4/7. Pt also has been vomiting all day. Pt states that pt has been unresponsive. On arrival, pt is lethargic but alert and eye opening to voice. Husband states she is "passed out" explained since pt is able to follow commands then she is not unresponsive. Denies fall. Denies recent head injury.  ?

## 2021-11-06 NOTE — ED Provider Notes (Signed)
? ?[facility] ?Provider Note ? ? ? Event Date/Time  ? First MD Initiated Contact with Patient 11/06/21 1712   ?  (approximate) ? ? ?History  ? ?Altered Mental Status ? ? ?HPI ? ?Lori Bentley is a 36 y.o. female with a past history of GERD, GI bleed, hiatal hernia with recent EGD by Dr. Vicente Males in January 2023 who comes ED complaining of vomiting and p.o. intolerance all day.  She was seen in the ED 2 previous times in the last 3 days, had a CT scan during 1 of these visits, negative work-ups.  She felt better after medications and was discharged each time.  Today she reports that her symptoms are worse again ? ? ?Physical Exam  ? ?Triage Vital Signs: ?ED Triage Vitals  ?Enc Vitals Group  ?   BP 11/06/21 1557 105/71  ?   Pulse Rate 11/06/21 1557 80  ?   Resp 11/06/21 1557 18  ?   Temp 11/06/21 1557 98 ?F (36.7 ?C)  ?   Temp Source 11/06/21 1557 Oral  ?   SpO2 11/06/21 1557 98 %  ?   Weight --   ?   Height --   ?   Head Circumference --   ?   Peak Flow --   ?   Pain Score 11/06/21 1554 0  ?   Pain Loc --   ?   Pain Edu? --   ?   Excl. in [place]? --   ? ? ?Most recent vital signs: ?Vitals:  ? 11/06/21 1557 11/06/21 1927  ?BP: 105/71 110/74  ?Pulse: 80 74  ?Resp: 18 16  ?Temp: 98 ?F (36.7 ?C)   ?SpO2: 98% 99%  ? ? ? ?General: Awake, no distress.  Moist oral mucosa ?CV:  Good peripheral perfusion.  Regular rate and rhythm ?Resp:  Normal effort.  Clear to auscultation bilaterally ?Abd:  No distention.  Soft and nontender ? ? ? ?ED Results / Procedures / Treatments  ? ?Labs ?(all labs ordered are listed, but only abnormal results are displayed) ?Labs Reviewed  ?COMPREHENSIVE METABOLIC PANEL - Abnormal; Notable for the following components:  ?    Result Value  ? Glucose, Bld 100 (*)   ? Calcium 8.6 (*)   ? All other components within normal limits  ?CBC - Abnormal; Notable for the following components:  ? WBC 12.0 (*)   ? Platelets 412 (*)   ? All other components within normal limits  ?LIPASE,  BLOOD  ?URINALYSIS, ROUTINE W REFLEX MICROSCOPIC  ?POC URINE PREG, ED  ? ? ? ?EKG ? ?Interpreted by me ?Normal sinus rhythm rate of 75.  Normal axis and intervals.  Normal QRS ST segments and T waves. ? ? ?RADIOLOGY ?X-ray abdomen and chest unremarkable.  Large hiatal hernia without signs of obstruction.  Radiology report reviewed ? ? ? ?PROCEDURES: ? ?Critical Care performed: No ? ?Procedures ? ? ?MEDICATIONS ORDERED IN ED: ?Medications  ?sucralfate (CARAFATE) tablet 1 g (1 g Oral Given 11/06/21 1757)  ?metoCLOPramide (REGLAN) injection 10 mg (10 mg Intravenous Given 11/06/21 1757)  ?diphenhydrAMINE (BENADRYL) injection 25 mg (25 mg Intravenous Given 11/06/21 1854)  ? ? ? ?IMPRESSION / MDM / ASSESSMENT AND PLAN / ED COURSE  ?I reviewed the triage vital signs and the nursing notes. ?             ?               ? ?Patient presents  with recurrent symptomatic hiatal hernia with GERD.  She reports compliance with Protonix, but has not picked up additional medications that were sent to her pharmacy during her last 2 ED visits. ? ?Patient was given Carafate, Benadryl, and Reglan.  Symptoms improved and she is tolerating oral intake.  I discussed her repeat presentation with GI Dr. Vicente Males who advises there are no additional interventions at this time and to continue antacid therapy.  Patient does have a follow-up with the gastroenterology clinic in about a week. ? ?She is nontoxic.  X-ray EKG and labs are all reassuring.  She does not require admission due to improvement of her symptoms and tolerating oral intake. ? ? ? ? ?  ? ? ?FINAL CLINICAL IMPRESSION(S) / ED DIAGNOSES  ? ?Final diagnoses:  ?Hiatal hernia with GERD  ? ? ? ?Rx / DC Orders  ? ?ED Discharge Orders   ? ?      Ordered  ?  sucralfate (CARAFATE) 1 g tablet  4 times daily       ? 11/06/21 2106  ?  metoCLOPramide (REGLAN) 10 MG tablet  Every 6 hours PRN       ? 11/06/21 2106  ? ?  ?  ? ?  ? ? ? ?Note:  This document was prepared using Dragon voice recognition  software and may include unintentional dictation errors. ?  ?Carrie Mew, MD ?11/06/21 2111 ? ?

## 2021-11-06 NOTE — ED Notes (Addendum)
Patient given wheelchair and slouched back in chair. Patient would not answer when husband or this RN said her name. Patient sternal rubbed and patient immediately responded with eye opening and speaking. Patient taken immediately to triage room and followed commands to obtain vitals. Patients husband verbally aggressive and repeated many times "We have been here multiple times and just getting the run around. They say she has a hernia and just send her home unresponsive." This RN attempted to comfort patients husband and let him know she is responsive and he was not accepting of this.  ?

## 2021-11-08 ENCOUNTER — Inpatient Hospital Stay: Payer: Medicaid Other

## 2021-11-09 ENCOUNTER — Inpatient Hospital Stay: Payer: Medicaid Other | Attending: Oncology

## 2021-11-09 DIAGNOSIS — D500 Iron deficiency anemia secondary to blood loss (chronic): Secondary | ICD-10-CM | POA: Diagnosis present

## 2021-11-09 LAB — CBC WITH DIFFERENTIAL/PLATELET
Abs Immature Granulocytes: 0.02 10*3/uL (ref 0.00–0.07)
Basophils Absolute: 0 10*3/uL (ref 0.0–0.1)
Basophils Relative: 0 %
Eosinophils Absolute: 0.6 10*3/uL — ABNORMAL HIGH (ref 0.0–0.5)
Eosinophils Relative: 6 %
HCT: 37.8 % (ref 36.0–46.0)
Hemoglobin: 12.2 g/dL (ref 12.0–15.0)
Immature Granulocytes: 0 %
Lymphocytes Relative: 27 %
Lymphs Abs: 2.3 10*3/uL (ref 0.7–4.0)
MCH: 27.1 pg (ref 26.0–34.0)
MCHC: 32.3 g/dL (ref 30.0–36.0)
MCV: 84 fL (ref 80.0–100.0)
Monocytes Absolute: 0.5 10*3/uL (ref 0.1–1.0)
Monocytes Relative: 5 %
Neutro Abs: 5.2 10*3/uL (ref 1.7–7.7)
Neutrophils Relative %: 62 %
Platelets: 402 10*3/uL — ABNORMAL HIGH (ref 150–400)
RBC: 4.5 MIL/uL (ref 3.87–5.11)
RDW: 13.4 % (ref 11.5–15.5)
WBC: 8.6 10*3/uL (ref 4.0–10.5)
nRBC: 0 % (ref 0.0–0.2)

## 2021-11-09 LAB — FERRITIN: Ferritin: 7 ng/mL — ABNORMAL LOW (ref 11–307)

## 2021-11-09 LAB — IRON AND TIBC
Iron: 26 ug/dL — ABNORMAL LOW (ref 28–170)
Saturation Ratios: 6 % — ABNORMAL LOW (ref 10.4–31.8)
TIBC: 412 ug/dL (ref 250–450)
UIBC: 386 ug/dL

## 2021-11-12 ENCOUNTER — Encounter: Payer: Self-pay | Admitting: *Deleted

## 2021-11-14 ENCOUNTER — Encounter: Payer: Self-pay | Admitting: Gastroenterology

## 2021-11-14 ENCOUNTER — Ambulatory Visit (INDEPENDENT_AMBULATORY_CARE_PROVIDER_SITE_OTHER): Payer: Medicaid Other | Admitting: Gastroenterology

## 2021-11-14 VITALS — BP 143/93 | HR 84 | Temp 99.0°F | Ht 60.0 in | Wt 186.8 lb

## 2021-11-14 DIAGNOSIS — K449 Diaphragmatic hernia without obstruction or gangrene: Secondary | ICD-10-CM | POA: Diagnosis not present

## 2021-11-14 DIAGNOSIS — R112 Nausea with vomiting, unspecified: Secondary | ICD-10-CM

## 2021-11-14 DIAGNOSIS — D509 Iron deficiency anemia, unspecified: Secondary | ICD-10-CM

## 2021-11-14 DIAGNOSIS — F1290 Cannabis use, unspecified, uncomplicated: Secondary | ICD-10-CM | POA: Diagnosis not present

## 2021-11-14 MED ORDER — FAMOTIDINE 40 MG PO TABS: 40.0000 mg | ORAL_TABLET | Freq: Every day | ORAL | 0 refills | Status: AC

## 2021-11-14 NOTE — Progress Notes (Signed)
?  ?[name] MD, MRCP(U.K) ?[address]  ?[address]  ?[place], [place] [address]  ?Main: [phone]  ?Fax: [phone] ? ? ?Primary Care Physician: Freddy Finner, NP ? ?Primary Gastroenterologist:  Dr. [name]  ? ?Chief Complaint  ?Patient presents with  ? Hernia  ? ? ?HPI: Lori Bentley is a 36 y.o. female ? ? ?Summary of history : ?Initially referred and seen in January 2021 for anemia.She was admitted to the hospital in 03/19/2019 with near syncope secondary to anemia hemoglobin was noted to be 5.8 g.  Noted to have severe iron deficiency and given IV iron.  She also received a blood transfusion.    Ferritin was 6 at that time.  History of menorrhagia for which she underwent a myomectomy of uterine fibroids in June 2019.  Looking back into her records in 2017 she also had microcytic anemia and was seen by another gastroenterologist unclear what happened after that.  Denied any overt loss. ?09/18/2019: EGD: Large hiatal hernia was noted otherwise normal endoscopic evaluation colonoscopy performed on the same day demonstrated no abnormalities.  ?  ?08/19/2019: Hemoglobin 7.8 g with an MCV of 78.4.  Iron of 11 and ferritin of 4. ?  ?09/18/2019: Colonoscopy : normal  ?11/23/2019: Capsule study normal  ?  ?07/04/2021: ER visit for dark stool.  ?07/06/2021: [place] x3 days ER visit.  ?07/08/2021: ER ABDOMINAL PAIN: [facility] ER visit  ?07/31/2021: hb 12.3  ?07/07/2021 CT scan of the abdomen pelvis with contrast showed no acute process. ? ?Follows with hematology Dr. Janese Banks getting IV iron. ?Doing well no complaints normal bowel movements. ?  ?Since ER visit no rectal bleeding , Coffee ground emesis. Taking Protonix 40 mg BID, no nausea or vomiting . Denies any NSAID use. No abdominal pain .  ?  ?Interval history   08/17/2021-11/14/2021 ? ?11/09/2021-hemoglobin 12.2 g, ferritin 7, ? ?08/18/2021: EGD: Large hiatal hernia gastritis noted in the stomach biopsies showed no abnormalities. ? ?She has had a few ER visits  for vomiting ? ?Taking Protonix 40 mg twice daily not on any famotidine.  No other issues except regurgitation nausea vomiting.  Nonbloody.  She does admit to exposure to marijuana.  She was supposed to get her IV iron but has missed appointment or not heard back from the cancer center.  She was referred to [facility] but not heard back from them. ? ?Current Outpatient Medications  ?Medication Sig Dispense Refill  ? albuterol (PROVENTIL HFA;VENTOLIN HFA) 108 (90 Base) MCG/ACT inhaler Inhale 2 puffs into the lungs every 6 (six) hours as needed for wheezing or shortness of breath. 1 Inhaler 0  ? ferrous sulfate 325 (65 FE) MG tablet Take 1 tablet (325 mg total) by mouth daily. (Patient not taking: Reported on 07/06/2021) 90 tablet 0  ? FLOVENT HFA 110 MCG/ACT inhaler Inhale 2 puffs into the lungs in the morning and at bedtime.    ? hydrALAZINE (APRESOLINE) 25 MG tablet Take by mouth. (Patient not taking: Reported on 05/16/2021)    ? lidocaine (XYLOCAINE) 2 % solution Use as directed 15 mLs in the mouth or throat as needed for mouth pain. 100 mL 0  ? loratadine (CLARITIN) 10 MG tablet Take 1 tablet by mouth daily. (Patient not taking: Reported on 05/16/2021)    ? metoCLOPramide (REGLAN) 10 MG tablet Take 1 tablet (10 mg total) by mouth every 6 (six) hours as needed. 30 tablet 0  ? mupirocin cream (BACTROBAN) 2 % Apply to affected area 3 times daily  30 g 0  ? pantoprazole (PROTONIX) 40 MG tablet TAKE 1 TABLET BY MOUTH DAILY 90 tablet 1  ? potassium chloride SA (KLOR-CON M) 20 MEQ tablet Take 1 tablet (20 mEq total) by mouth daily for 5 days. 5 tablet 0  ? sucralfate (CARAFATE) 1 g tablet Take 1 tablet (1 g total) by mouth 4 (four) times daily. 120 tablet 1  ? ?No current facility-administered medications for this visit.  ? ? ?Allergies as of 11/14/2021 - Review Complete 11/06/2021  ?Allergen Reaction Noted  ? Nsaids  12/26/2017  ? Oxycodone Swelling 02/18/2020  ? Peanut-containing drug products Itching 01/02/2018  ? Percocet  [oxycodone-acetaminophen] Swelling 01/15/2015  ? Phenylephrine-guaifenesin Other (See Comments) 06/19/2012  ? ? ?ROS: ? ?General: Negative for anorexia, weight loss, fever, chills, fatigue, weakness. ?ENT: Negative for hoarseness, difficulty swallowing , nasal congestion. ?CV: Negative for chest pain, angina, palpitations, dyspnea on exertion, peripheral edema.  ?Respiratory: Negative for dyspnea at rest, dyspnea on exertion, cough, sputum, wheezing.  ?GI: See history of present illness. ?GU:  Negative for dysuria, hematuria, urinary incontinence, urinary frequency, nocturnal urination.  ?Endo: Negative for unusual weight change.  ?  ?Physical Examination: ? ? There were no vitals taken for this visit. ? ?General: Well-nourished, well-developed in no acute distress.  ?Eyes: No icterus. Conjunctivae pink. ?Neuro: Alert and oriented x 3.  Grossly intact. ?Skin: Warm and dry, no jaundice.   ?Psych: Alert and cooperative, normal mood and affect. ? ? ?Imaging Studies: ?CT ABDOMEN PELVIS W CONTRAST ? ?Result Date: 11/03/2021 ?CLINICAL DATA:  Acute abdominal pain, nonlocalized, history diabetes mellitus, GERD, hiatal hernia EXAM: CT ABDOMEN AND PELVIS WITH CONTRAST TECHNIQUE: Multidetector CT imaging of the abdomen and pelvis was performed using the standard protocol following bolus administration of intravenous contrast. RADIATION DOSE REDUCTION: This exam was performed according to the departmental dose-optimization program which includes automated exposure control, adjustment of the mA and/or kV according to patient size and/or use of iterative reconstruction technique. CONTRAST:  122m OMNIPAQUE IOHEXOL 300 MG/ML SOLN IV. No oral contrast. COMPARISON:  07/07/2021 FINDINGS: Lower chest: Minimal subsegmental atelectasis at medial RIGHT lower lobe. Remaining lung bases clear. Hepatobiliary: Gallbladder and liver normal appearance Pancreas: Normal appearance Spleen: Normal appearance Adrenals/Urinary Tract: Adrenal glands,  kidneys, ureters, and bladder normal appearance Stomach/Bowel: Normal appendix. Large hiatal hernia with 75% of stomach in the inferior mediastinum and inferior lower RIGHT chest. Large and small bowel loops unremarkable. Vascular/Lymphatic: Scattered pelvic phleboliths. Aorta normal caliber. Vascular structures patent. No adenopathy. Reproductive: IUD within uterus.  Ovaries unremarkable for age Other: No free air or free fluid. No hernia or inflammatory process. Musculoskeletal: Unremarkable IMPRESSION: Large hiatal hernia. No acute intra-abdominal or intrapelvic abnormalities. Electronically Signed   By: MLavonia DanaM.D.   On: 11/03/2021 10:30  ? ?DG Abdomen Acute W/Chest ? ?Result Date: 11/06/2021 ?CLINICAL DATA:  upper abd pain, vomiting EXAM: DG ABDOMEN ACUTE WITH 1 VIEW CHEST COMPARISON:  CT AP, 11/03/2021. FINDINGS: No evidence of dilated bowel loops or free intraperitoneal air. Pelvic phleboliths, no radiopaque calculi. IUD. Tubal ligation clips. Cardiac silhouette is within normal limits. Moderate-sized hiatus hernia. Lungs are well inflated. No focal consolidation or mass. No pleural effusion or pneumothorax. No acute displaced fracture. IMPRESSION: 1. Moderate-sized hiatus hernia. Findings may contribute to patient's reported vomiting and upper abdominal pain. 2. Nonobstructed bowel gas pattern. 3. IUD and tubal ligation clips. Electronically Signed   By: JMichaelle BirksM.D.   On: 11/06/2021 18:31   ? ?Assessment and Plan:  ? ?  Lori Bentley is a 36 y.o. y/o female here to follow-up after ER visit for recurrent episodes of nausea and vomiting very likely related to GERD from large hiatal hernia and in addition exposure to marijuana.  Multiple ER admissions. ? ?Plan ?1.  Add famotidine to Protonix 40 mg twice daily.  Advised to use a wedge pillow at night.  He could have a wedge pillow provided.  Advised to avoid fatty meals and large meals just before bedtime. ? ?2.  I will get in touch with  [facility] Dr. Nadeen Landau to refer for hiatal hernia repair.  She has had multiple ER visits for the same.  I explained to her clearly that until she gets her hiatal hernia repaired her underlyi

## 2021-11-14 NOTE — Patient Instructions (Addendum)
Begin Pepcid 40 mg at bedtime. ?Wedge pillow for sleeping. ?Referral will be sent to Dr. Nadeen Landau at [facility] for [place]. ?4. Return appt video visit 6-8 weeks ?5. Iron Infusion 11/17/21 at 2:15pm ?

## 2021-11-15 ENCOUNTER — Ambulatory Visit: Payer: Medicaid Other | Admitting: Oncology

## 2021-11-15 ENCOUNTER — Other Ambulatory Visit: Payer: Medicaid Other

## 2021-11-15 ENCOUNTER — Telehealth: Payer: Self-pay

## 2021-11-15 ENCOUNTER — Inpatient Hospital Stay (HOSPITAL_BASED_OUTPATIENT_CLINIC_OR_DEPARTMENT_OTHER): Payer: Medicaid Other | Admitting: Medical Oncology

## 2021-11-15 ENCOUNTER — Encounter: Payer: Self-pay | Admitting: Medical Oncology

## 2021-11-15 DIAGNOSIS — D500 Iron deficiency anemia secondary to blood loss (chronic): Secondary | ICD-10-CM

## 2021-11-15 NOTE — Progress Notes (Signed)
? ?Virtual Visit Progress Note ? ?Lori Bentley,you are scheduled for a virtual visit with your provider today.   ? ?Just as we do with appointments in the office, we must obtain your consent to participate.  Your consent will be active for this visit and any virtual visit you may have with one of our providers in the next 365 days.   ? ?If you have a MyChart account, I can also send a copy of this consent to you electronically.  All virtual visits are billed to your insurance company just like a traditional visit in the office.  As this is a virtual visit, video technology does not allow for your provider to perform a traditional examination.  This may limit your provider's ability to fully assess your condition.  If your provider identifies any concerns that need to be evaluated in person or the need to arrange testing such as labs, EKG, etc, we will make arrangements to do so.   ? ?Although advances in technology are sophisticated, we cannot ensure that it will always work on either your end or our end.  If the connection with a video visit is poor, we may have to switch to a telephone visit.  With either a video or telephone visit, we are not always able to ensure that we have a secure connection.   I need to obtain your verbal consent now.   Are you willing to proceed with your visit today?  ? ?Lori Bentley has provided verbal consent on 11/15/2021 for a virtual visit (video or telephone). ? ? ?Hughie Closs, PA-C ?11/15/2021  2:40 PM ?  ? ?I connected with Lori Bentley on 11/15/21 at  2:30 PM EDT by video enabled telemedicine visit and verified that I am speaking with the correct person using two identifiers.  ? ?I discussed the limitations, risks, security and privacy concerns of performing an evaluation and management service by telemedicine and the availability of in-person appointments. I also discussed with the patient that there may be a patient responsible charge related to  this service. The patient expressed understanding and agreed to proceed.  ? ?Other persons participating in the visit and their role in the encounter: None  ? ?Patient?s location: Home ?Provider?s location: Clinic  ? ?Chief Complaint: Iron Deficiency Anemia  ?   ?Patient Care Team: ?Freddy Finner, NP as PCP - General (Nurse Practitioner) ?Sindy Guadeloupe, MD as Consulting Physician (Oncology)  ? ?Name of the patient: Lori Bentley  ?[id number]  ?April 01, 1986  ? ?Date of visit: 11/15/21 ? ?History of Presenting Illness: ? ?IDA: Patient reports having fatigue. Recently had blood work to evaluate her iron deficiency anemia. If counts are low she would like to resume Feraheme treatments. She reports tolerating them well previously. She denies recently heavy menstrual cycles since having an IUD placed. No melena or other sources of bleeding. NO chest pain or SOB. Not on oral supplementation.  ? ?Review of systems- Review of Systems  ?Constitutional:  Negative for malaise/fatigue.  ?Respiratory:  Negative for shortness of breath.   ?Cardiovascular:  Negative for chest pain.  ?Gastrointestinal:  Negative for blood in stool, diarrhea and melena.  ?Skin:  Negative for rash.   ? ?Allergies  ?Allergen Reactions  ? Nsaids   ?  GI BLEED-HAD BLOOD TRASNFUSION  ? Oxycodone Swelling  ? Peanut-Containing Drug Products Itching  ?  Itching in throat  ? Percocet [Oxycodone-Acetaminophen] Swelling  ?  Swelling of face and throat  ? Phenylephrine-Guaifenesin  Other (See Comments)  ?  Unknown reaction. Does not remember reaction  ? ? ?Past Medical History:  ?Diagnosis Date  ? Anxiety   ? Asthma   ? WELL CONTROLLED  ? Bipolar affective disorder, currently manic, mild ([place])   ? Chronic anemia   ? Depression   ? Diabetes ([place]) 12/2017  ? patient denies diabetes  ? GERD (gastroesophageal reflux disease)   ? History of blood transfusion 2018  ? GI BLEED  ? History of hiatal hernia   ? ? ?Past Surgical History:  ?Procedure Laterality Date  ?  COLONOSCOPY WITH PROPOFOL N/A 09/18/2019  ? Procedure: COLONOSCOPY WITH PROPOFOL;  Surgeon: [name], MD;  Location: [facility] ENDOSCOPY;  Service: Gastroenterology;  Laterality: N/A;  ? ESOPHAGOGASTRODUODENOSCOPY (EGD) WITH PROPOFOL N/A 05/18/2016  ? Procedure: ESOPHAGOGASTRODUODENOSCOPY (EGD) WITH PROPOFOL;  Surgeon: Manya Silvas, MD;  Location: [facility] ENDOSCOPY;  Service: Endoscopy;  Laterality: N/A;  ? ESOPHAGOGASTRODUODENOSCOPY (EGD) WITH PROPOFOL N/A 09/18/2019  ? Procedure: ESOPHAGOGASTRODUODENOSCOPY (EGD) WITH PROPOFOL;  Surgeon: [name], MD;  Location[facility] ENDOSCOPY;  Service: Gastroenterology;  Laterality: N/A;  ? ESOPHAGOGASTRODUODENOSCOPY (EGD) WITH PROPOFOL N/A 08/18/2021  ? Procedure: ESOPHAGOGASTRODUODENOSCOPY (EGD) WITH PROPOFOL;  Surgeon: [name], MD;  Location: [facility] ENDOSCOPY;  Service: Gastroenterology;  Laterality: N/A;  ? GIVENS CAPSULE STUDY N/A 11/09/2019  ? Procedure: GIVENS CAPSULE STUDY;  Surgeon: [name], MD;  Location: [facility] ENDOSCOPY;  Service: Gastroenterology;  Laterality: N/A;  ? MYOMECTOMY N/A 01/02/2018  ? Procedure: MYOMECTOMY;  Surgeon: Gae Dry, MD;  Location: [facility] ORS;  Service: Gynecology;  Laterality: N/A;  ? TUBAL LIGATION  2012  ? ? ?Social History  ? ?Socioeconomic History  ? Marital status: Married  ?  Spouse name: Not on file  ? Number of children: Not on file  ? Years of education: Not on file  ? Highest education level: Not on file  ?Occupational History  ? Not on file  ?Tobacco Use  ? Smoking status: Former  ?  Packs/day: 0.25  ?  Years: 1.00  ?  Pack years: 0.25  ?  Types: Cigarettes  ?  Quit date: 10/24/2020  ?  Years since quitting: 1.0  ? Smokeless tobacco: Never  ?Vaping Use  ? Vaping Use: Never used  ?Substance and Sexual Activity  ? Alcohol use: Not Currently  ? Drug use: Yes  ?  Types: Marijuana  ?  Comment: occasionally   ? Sexual activity: Yes  ?  Birth control/protection: I.U.D.  ?  Comment: mirena  ?Other Topics Concern  ? Not on file  ?Social  History Narrative  ? Not on file  ? ?Social Determinants of Health  ? ?Financial Resource Strain: Not on file  ?Food Insecurity: Not on file  ?Transportation Needs: Not on file  ?Physical Activity: Not on file  ?Stress: Not on file  ?Social Connections: Not on file  ?Intimate Partner Violence: Not on file  ? ? ?Immunization History  ?Administered Date(s) Administered  ? Hepatitis B 12/10/2014  ? Influenza Split 05/07/2013, 04/21/2014  ? Tdap 12/10/2014  ? ? ?Family History  ?Problem Relation Age of Onset  ? Anxiety disorder Mother   ? Asthma Mother   ? Fibroids Mother   ? GER disease Father   ? ? ? ?Current Outpatient Medications:  ?  albuterol (PROVENTIL HFA;VENTOLIN HFA) 108 (90 Base) MCG/ACT inhaler, Inhale 2 puffs into the lungs every 6 (six) hours as needed for wheezing or shortness of breath., Disp: 1 Inhaler, Rfl: 0 ?  famotidine (  PEPCID) 40 MG tablet, Take 1 tablet (40 mg total) by mouth at bedtime., Disp: 90 tablet, Rfl: 0 ?  potassium chloride SA (KLOR-CON M) 20 MEQ tablet, Take 1 tablet (20 mEq total) by mouth daily for 5 days., Disp: 5 tablet, Rfl: 0 ?  venlafaxine (EFFEXOR) 75 MG tablet, Take 75 mg by mouth 2 (two) times daily., Disp: , Rfl:  ?  ferrous sulfate 325 (65 FE) MG tablet, Take 1 tablet (325 mg total) by mouth daily. (Patient not taking: Reported on 07/06/2021), Disp: 90 tablet, Rfl: 0 ?  FLOVENT HFA 110 MCG/ACT inhaler, Inhale 2 puffs into the lungs in the morning and at bedtime. (Patient not taking: Reported on 11/14/2021), Disp: , Rfl:  ?  hydrALAZINE (APRESOLINE) 25 MG tablet, Take by mouth. (Patient not taking: Reported on 05/16/2021), Disp: , Rfl:  ?  lidocaine (XYLOCAINE) 2 % solution, Use as directed 15 mLs in the mouth or throat as needed for mouth pain. (Patient not taking: Reported on 11/14/2021), Disp: 100 mL, Rfl: 0 ?  loratadine (CLARITIN) 10 MG tablet, Take 1 tablet by mouth daily. (Patient not taking: Reported on 05/16/2021), Disp: , Rfl:  ?  metoCLOPramide (REGLAN) 10 MG tablet,  Take 1 tablet (10 mg total) by mouth every 6 (six) hours as needed. (Patient not taking: Reported on 11/14/2021), Disp: 30 tablet, Rfl: 0 ?  mupirocin cream (BACTROBAN) 2 %, Apply to affected area 3 times

## 2021-11-15 NOTE — Telephone Encounter (Signed)
Contacted Mrs. Shostak to inform her that she needs to contact [facility] to schedule her office visit.   ?Receptionist Earnest Bailey) informed me that they sent her a letter in January and tried to contact her to schedule but was unable to reach her. ?Mrs. Mcelwain said she will call to make the appt. ? ?Thanks, ?Sharyn Lull, CMA ?

## 2021-11-17 ENCOUNTER — Other Ambulatory Visit: Payer: Self-pay | Admitting: Oncology

## 2021-11-17 ENCOUNTER — Inpatient Hospital Stay: Payer: Medicaid Other

## 2021-11-17 VITALS — BP 134/94 | HR 88 | Temp 97.4°F | Resp 18

## 2021-11-17 DIAGNOSIS — D500 Iron deficiency anemia secondary to blood loss (chronic): Secondary | ICD-10-CM

## 2021-11-17 MED ORDER — SODIUM CHLORIDE 0.9 % IV SOLN
Freq: Once | INTRAVENOUS | Status: AC
  Filled 2021-11-17: qty 250

## 2021-11-17 MED ORDER — SODIUM CHLORIDE 0.9 % IV SOLN
510.0000 mg | INTRAVENOUS | Status: DC
  Administered 2021-11-17: 510 mg via INTRAVENOUS
  Filled 2021-11-17: qty 17

## 2021-11-24 ENCOUNTER — Inpatient Hospital Stay: Payer: Medicaid Other

## 2021-11-24 VITALS — BP 115/94 | HR 86 | Temp 97.0°F | Resp 18

## 2021-11-24 DIAGNOSIS — D500 Iron deficiency anemia secondary to blood loss (chronic): Secondary | ICD-10-CM | POA: Diagnosis not present

## 2021-11-24 MED ORDER — SODIUM CHLORIDE 0.9 % IV SOLN
510.0000 mg | INTRAVENOUS | Status: DC
  Administered 2021-11-24: 510 mg via INTRAVENOUS
  Filled 2021-11-24: qty 510

## 2021-11-24 MED ORDER — SODIUM CHLORIDE 0.9 % IV SOLN
Freq: Once | INTRAVENOUS | Status: AC
  Filled 2021-11-24: qty 250

## 2021-12-15 ENCOUNTER — Other Ambulatory Visit: Payer: Self-pay | Admitting: General Surgery

## 2021-12-15 DIAGNOSIS — K219 Gastro-esophageal reflux disease without esophagitis: Secondary | ICD-10-CM

## 2021-12-19 ENCOUNTER — Ambulatory Visit
Admission: RE | Admit: 2021-12-19 | Discharge: 2021-12-19 | Disposition: A | Discharge status: Home / Self Care | Payer: Medicaid Other | Source: Ambulatory Visit | Attending: General Surgery | Admitting: General Surgery

## 2021-12-19 DIAGNOSIS — K219 Gastro-esophageal reflux disease without esophagitis: Secondary | ICD-10-CM

## 2022-01-08 ENCOUNTER — Telehealth: Payer: Medicaid Other | Admitting: Gastroenterology

## 2022-01-08 NOTE — Progress Notes (Deleted)
Lori Bentley , MD [address]  [place]  [place], [place] [address]  Main: [phone]  Fax: [phone]   Primary Care Physician: Freddy Finner, NP  Virtual Visit via Video Note  I connected with patient on 01/08/22 at  3:15 PM EDT by video and verified that I am speaking with the correct person using two identifiers.   I discussed the limitations, risks, security and privacy concerns of performing an evaluation and management service by video  and the availability of in person appointments. I also discussed with the patient that there may be a patient responsible charge related to this service. The patient expressed understanding and agreed to proceed.  Location of Patient: Home Location of Provider: Home Persons involved: Patient and provider only   History of Present Illness: No chief complaint on file.   HPI: Lori Bentley is a 36 y.o. female  Summary of history : Initially referred and seen in January 2021 for anemia.She was admitted to the hospital in 03/19/2019 with near syncope secondary to anemia hemoglobin was noted to be 5.8 g.  Noted to have severe iron deficiency and given IV iron.  She also received a blood transfusion.    Ferritin was 6 at that time.  History of menorrhagia for which she underwent a myomectomy of uterine fibroids in June 2019.  Looking back into her records in 2017 she also had microcytic anemia and was seen by another gastroenterologist unclear what happened after that.  Denied any overt loss. 09/18/2019: EGD: Large hiatal hernia was noted otherwise normal endoscopic evaluation colonoscopy performed on the same day demonstrated no abnormalities.    08/19/2019: Hemoglobin 7.8 g with an MCV of 78.4.  Iron of 11 and ferritin of 4.   09/18/2019: Colonoscopy : normal  11/23/2019: Capsule study normal    07/04/2021: ER visit for dark stool.  07/06/2021: Melena x3 days ER visit.  07/08/2021: ER ABDOMINAL PAIN: [facility] ER visit  07/31/2021:  hb 12.3  07/07/2021 CT scan of the abdomen pelvis with contrast showed no acute process.   Follows with hematology Dr. Janese Banks getting IV iron. 11/09/2021-hemoglobin 12.2 g, ferritin 7,  08/18/2021: EGD: Large hiatal hernia gastritis noted in the stomach biopsies showed no abnormalities.    Since ER visit no rectal bleeding , Coffee ground emesis. Taking Protonix 40 mg BID, no nausea or vomiting . Denies any NSAID use. No abdominal pain .    Interval history  11/14/2021-01/08/2022   12/19/2021: Barium swallow large hiatal hernia.    She has had a few ER visits for vomiting   Taking Protonix 40 mg twice daily not on any famotidine.  No other issues except regurgitation nausea vomiting.  Nonbloody.  She does admit to exposure to marijuana.  She was supposed to get her IV iron but has missed appointment or not heard back from the cancer center.  She was referred to [facility] but not heard back from them      Current Outpatient Medications  Medication Sig Dispense Refill   albuterol (PROVENTIL HFA;VENTOLIN HFA) 108 (90 Base) MCG/ACT inhaler Inhale 2 puffs into the lungs every 6 (six) hours as needed for wheezing or shortness of breath. 1 Inhaler 0   famotidine (PEPCID) 40 MG tablet Take 1 tablet (40 mg total) by mouth at bedtime. 90 tablet 0   ferrous sulfate 325 (65 FE) MG tablet Take 1 tablet (325 mg total) by mouth daily. (Patient not taking: Reported on 07/06/2021) 90 tablet 0  FLOVENT HFA 110 MCG/ACT inhaler Inhale 2 puffs into the lungs in the morning and at bedtime. (Patient not taking: Reported on 11/14/2021)     hydrALAZINE (APRESOLINE) 25 MG tablet Take by mouth. (Patient not taking: Reported on 05/16/2021)     lidocaine (XYLOCAINE) 2 % solution Use as directed 15 mLs in the mouth or throat as needed for mouth pain. (Patient not taking: Reported on 11/14/2021) 100 mL 0   loratadine (CLARITIN) 10 MG tablet Take 1 tablet by mouth daily. (Patient not taking: Reported on 05/16/2021)      metoCLOPramide (REGLAN) 10 MG tablet Take 1 tablet (10 mg total) by mouth every 6 (six) hours as needed. (Patient not taking: Reported on 11/14/2021) 30 tablet 0   mupirocin cream (BACTROBAN) 2 % Apply to affected area 3 times daily (Patient not taking: Reported on 11/14/2021) 30 g 0   potassium chloride SA (KLOR-CON M) 20 MEQ tablet Take 1 tablet (20 mEq total) by mouth daily for 5 days. 5 tablet 0   sucralfate (CARAFATE) 1 g tablet Take 1 tablet (1 g total) by mouth 4 (four) times daily. (Patient not taking: Reported on 11/15/2021) 120 tablet 1   venlafaxine (EFFEXOR) 75 MG tablet Take 75 mg by mouth 2 (two) times daily.     No current facility-administered medications for this visit.    Allergies as of 01/08/2022 - Review Complete 11/15/2021  Allergen Reaction Noted   Nsaids  12/26/2017   Oxycodone Swelling 02/18/2020   Peanut-containing drug products Itching 01/02/2018   Percocet [oxycodone-acetaminophen] Swelling 01/15/2015   Phenylephrine-guaifenesin Other (See Comments) 06/19/2012    Review of Systems:    All systems reviewed and negative except where noted in HPI.  General Appearance:    Alert, cooperative, no distress, appears stated age  Head:    Normocephalic, without obvious abnormality, atraumatic  Eyes:    PERRL, conjunctiva/corneas clear,  Ears:    Grossly normal hearing    Neurologic:  Grossly normal    Observations/Objective:  Labs: CMP     Component Value Date/Time   NA 137 11/06/2021 1558   K 3.8 11/06/2021 1558   CL 104 11/06/2021 1558   CO2 25 11/06/2021 1558   GLUCOSE 100 (H) 11/06/2021 1558   BUN 9 11/06/2021 1558   CREATININE 0.75 11/06/2021 1558   CALCIUM 8.6 (L) 11/06/2021 1558   PROT 7.7 11/06/2021 1558   ALBUMIN 4.0 11/06/2021 1558   AST 19 11/06/2021 1558   ALT 17 11/06/2021 1558   ALKPHOS 59 11/06/2021 1558   BILITOT 0.5 11/06/2021 1558   GFRNONAA >60 11/06/2021 1558   GFRAA >60 03/28/2019 0842   Lab Results  Component Value Date   WBC  8.6 11/09/2021   HGB 12.2 11/09/2021   HCT 37.8 11/09/2021   MCV 84.0 11/09/2021   PLT 402 (H) 11/09/2021    Imaging Studies: DG UGI W DOUBLE CM (HD BA)  Result Date: 12/19/2021 CLINICAL DATA:  Gastroesophageal reflux disease, unspecified whether esophagitis present EXAM: UPPER GI SERIES WITHOUT KUB TECHNIQUE: Routine upper GI series was performed with thin/high density/water soluble barium. FLUOROSCOPY: Radiation Exposure Index (as provided by the fluoroscopic device): 3.7 minutes (72 mGy) COMPARISON:  CT April 2023 FINDINGS: Fluoroscopic evaluation of the pharynx during rapid sequence imaging with swallows of barium reveal no focal abnormalities. There is no evidence of esophageal mass or stricture. Esophageal motility is normal. Normal function of the lower esophageal sphincter. Large hiatal hernia is present. No gastroesophageal reflux was seen during the examination  or elicited with Valsalva maneuvers. Limited visualization of the stomach and duodenal bulb are normal. Normal duodenal sweep. IMPRESSION: Large hiatal hernia. Otherwise normal exam of the esophagus without findings of esophageal dysmotility, stricture, or gastroesophageal reflux. Electronically Signed   By: Albin Felling M.D.   On: 12/19/2021 10:39    Assessment and Plan:   Lori Bentley is a 36 y.o. y/o femalehere to follow-up for recurrent episodes of nausea and vomiting very likely related to GERD from large hiatal hernia and in addition exposure to marijuana.  Multiple ER admissions.  Plan 1.  Add famotidine to Protonix 40 mg twice daily.  Advised to use a wedge pillow at night.  He could have a wedge pillow provided.  Advised to avoid fatty meals and large meals just before bedtime.   2.  I will get in touch with [facility] Dr. Nadeen Landau to refer for hiatal hernia repair.  She has had multiple ER visits for the same.  I explained to her clearly that until she gets her hiatal hernia repaired her  underlying issue may not get resolved.   3.  Advised to stop exposure to marijuana as it can precipitate recurrent episodes of vomiting   4.  IV infusions for iron with cancer center to continue       I discussed the assessment and treatment plan with the patient. The patient was provided an opportunity to ask questions and all were answered. The patient agreed with the plan and demonstrated an understanding of the instructions.   The patient was advised to call back or seek an in-person evaluation if the symptoms worsen or if the condition fails to improve as anticipated.  I provided *** minutes of face-to-face time during this encounter.  Dr Lori Bellows MD,MRCP [facility]) Gastroenterology/Hepatology Pager: [phone]   Speech recognition software was used to dictate this note.

## 2022-02-19 ENCOUNTER — Ambulatory Visit: Payer: Self-pay | Admitting: General Surgery

## 2022-02-19 DIAGNOSIS — D500 Iron deficiency anemia secondary to blood loss (chronic): Secondary | ICD-10-CM

## 2022-02-19 NOTE — Progress Notes (Signed)
Sent message, via epic in basket, requesting order in epic from surgeon     02/19/22 0957  Preop Orders  Has preop orders? No  Name of staff/physician contacted for orders(Indicate phone or IB message) E. Redmond Pulling, MD.

## 2022-02-21 NOTE — Progress Notes (Signed)
Anesthesia Review:  PCP: Cardiologist : Chest x-ray : 11/06/21 I view  EKG : 11/07/21  Echo : Stress test: Cardiac Cath :  Activity level:  Sleep Study/ CPAP : Fasting Blood Sugar :      / Checks Blood Sugar -- times a day:   Blood Thinner/ Instructions /Last Dose: ASA / Instructions/ Last Dose :

## 2022-02-23 NOTE — Patient Instructions (Signed)
SURGICAL WAITING ROOM VISITATION Patients having surgery or a procedure may have no more than 2 support people in the waiting area - these visitors may rotate.   Children under the age of 83 must have an adult with them who is not the patient. If the patient needs to stay at the hospital during part of their recovery, the visitor guidelines for inpatient rooms apply. Pre-op nurse will coordinate an appropriate time for 1 support person to accompany patient in pre-op.  This support person may not rotate.    Please refer to the [facility] website for the visitor guidelines for Inpatients (after your surgery is over and you are in a regular room).       Your procedure is scheduled on:  03/05/2022.    Report to [facility] Main Entrance    Report to admitting at   [phone]   Call this number if you have problems the morning of surgery [phone]   Do not eat food :After Midnight.   After Midnight you may have the following liquids until _ 0415_____ AM  DAY OF SURGERY  Water Non-Citrus Juices (without pulp, NO RED) Carbonated Beverages Black Coffee (NO MILK/CREAM OR CREAMERS, sugar ok)  Clear Tea (NO MILK/CREAM OR CREAMERS, sugar ok) regular and decaf                             Plain Jell-O (NO RED)                                           Fruit ices (not with fruit pulp, NO RED)                                     Popsicles (NO RED)                                                               Sports drinks like Gatorade (NO RED)                     The day of surgery:  Drink ONE (1) Pre-Surgery Clear Ensure or G2 at AM the morning of surgery. Drink in one sitting. Do not sip.  This drink was given to you during your hospital  pre-op appointment visit. Nothing else to drink after completing the  Pre-Surgery Clear Ensure or G2.          If you have questions, please contact your surgeon's office.       Oral Hygiene is also important to reduce your risk of  infection.                                    Remember - BRUSH YOUR TEETH THE MORNING OF SURGERY WITH YOUR REGULAR TOOTHPASTE   Do NOT smoke after Midnight   Take these medicines the morning of surgery with A SIP OF WATER:  inhalers as usual and bring, effexor [place] - Preparing for Surgery  Before surgery, you can play an important role.  Because skin is not sterile, your skin needs to be as free of germs as possible.  You can reduce the number of germs on your skin by washing with CHG (chlorahexidine gluconate) soap before surgery.  CHG is an antiseptic cleaner which kills germs and bonds with the skin to continue killing germs even after washing. Please DO NOT use if you have an allergy to CHG or antibacterial soaps.  If your skin becomes reddened/irritated stop using the CHG and inform your nurse when you arrive at Short Stay. Do not shave (including legs and underarms) for at least 48 hours prior to the first CHG shower.  You may shave your face/neck. Please follow these instructions carefully:  1.  Shower with CHG Soap the night before surgery and the  morning of Surgery.  2.  If you choose to wash your hair, wash your hair first as usual with your  normal  shampoo.  3.  After you shampoo, rinse your hair and body thoroughly to remove the  shampoo.                           4.  Use CHG as you would any other liquid soap.  You can apply chg directly  to the skin and wash                       Gently with a scrungie or clean washcloth.  5.  Apply the CHG Soap to your body ONLY FROM THE NECK DOWN.   Do not use on face/ open                           Wound or open sores. Avoid contact with eyes, ears mouth and genitals (private parts).                       Wash face,  Genitals (private parts) with your normal soap.             6.  Wash thoroughly, paying special attention to the area where your surgery  will be performed.  7.  Thoroughly rinse your body with warm water from the neck  down.  8.  DO NOT shower/wash with your normal soap after using and rinsing off  the CHG Soap.                9.  Pat yourself dry with a clean towel.            10.  Wear clean pajamas.            11.  Place clean sheets on your bed the night of your first shower and do not  sleep with pets. Day of Surgery : Do not apply any lotions/deodorants the morning of surgery.  Please wear clean clothes to the hospital/surgery center.  FAILURE TO FOLLOW THESE INSTRUCTIONS MAY RESULT IN THE CANCELLATION OF YOUR SURGERY PATIENT SIGNATURE_________________________________  NURSE SIGNATURE__________________________________  ________________________________________________________________________   DO NOT TAKE ANY ORAL DIABETIC MEDICATIONS DAY OF YOUR SURGERY  Bring CPAP mask and tubing day of surgery.                              You may not have any metal on your body including hair pins, jewelry,  and body piercing             Do not wear make-up, lotions, powders, perfumes/cologne, or deodorant  Do not wear nail polish including gel and S&S, artificial/acrylic nails, or any other type of covering on natural nails including finger and toenails. If you have artificial nails, gel coating, etc. that needs to be removed by a nail salon please have this removed prior to surgery or surgery may need to be canceled/ delayed if the surgeon/ anesthesia feels like they are unable to be safely monitored.   Do not shave  48 hours prior to surgery.               Men may shave face and neck.   Do not bring valuables to the hospital. [facility].   Contacts, dentures or bridgework may not be worn into surgery.   Bring small overnight bag day of surgery.   DO NOT [place]. PHARMACY WILL DISPENSE MEDICATIONS LISTED ON YOUR MEDICATION LIST TO YOU DURING YOUR ADMISSION [place]!    Patients discharged on the day of surgery  will not be allowed to drive home.  Someone NEEDS to stay with you for the first 24 hours after anesthesia.   Special Instructions: Bring a copy of your healthcare power of attorney and living will documents         the day of surgery if you haven't scanned them before.              Please read over the following fact sheets you were given: IF YOU HAVE [place] [phone]

## 2022-02-26 ENCOUNTER — Encounter (HOSPITAL_COMMUNITY)
Admission: RE | Admit: 2022-02-26 | Discharge: 2022-02-26 | Disposition: A | Discharge status: Home / Self Care | Payer: Medicaid Other | Source: Ambulatory Visit | Attending: Anesthesiology | Admitting: Anesthesiology

## 2022-02-26 DIAGNOSIS — Z01818 Encounter for other preprocedural examination: Secondary | ICD-10-CM

## 2022-02-26 NOTE — Progress Notes (Signed)
PT did not show for preop appt on 02/26/22 at 0900am.  Called pt and pt stated she was not having surgery.  Asked pt if she had called surgeon's office and pt stated no.  Instructed pt to call surgeon office.  PT voiced understanding.  Called [facility] and spoke with Elmyra Ricks in Triage and informed of above.

## 2022-03-05 ENCOUNTER — Encounter (HOSPITAL_COMMUNITY): Admission: RE | Payer: Self-pay | Source: Home / Self Care

## 2022-03-05 ENCOUNTER — Ambulatory Visit (HOSPITAL_COMMUNITY): Admission: RE | Admit: 2022-03-05 | Payer: Medicaid Other | Source: Home / Self Care | Admitting: General Surgery

## 2022-03-05 SURGERY — REPAIR, HERNIA, HIATAL, ROBOT-ASSISTED
Anesthesia: General

## 2022-05-17 ENCOUNTER — Other Ambulatory Visit: Payer: Medicaid Other

## 2022-05-18 ENCOUNTER — Telehealth: Payer: Medicaid Other | Admitting: Nurse Practitioner

## 2022-12-22 IMAGING — CT CT HEAD W/O CM
4 series · 17 of 47 positions shown, 19 images · non-contrast
Comparison: None.

CLINICAL DATA: Mental status change, unknown cause



[Series 2: head wo · axial · 0.47mm/px · z∈[-125,-5]mm · 7 of 34 slices shown, 9 images]
[im 5/34  brain]
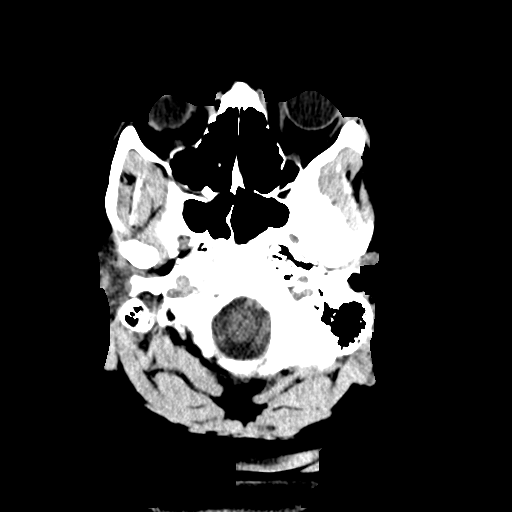
[im 5/34  bone]
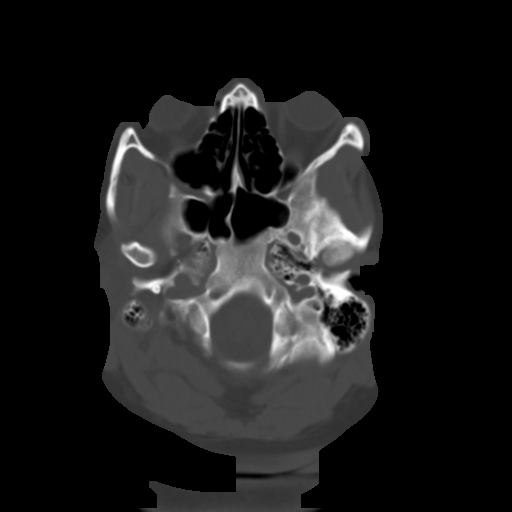
[im 9/34  brain]
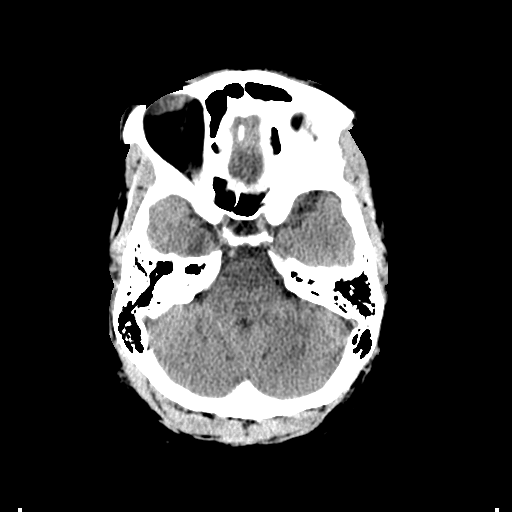
[im 13/34  brain]
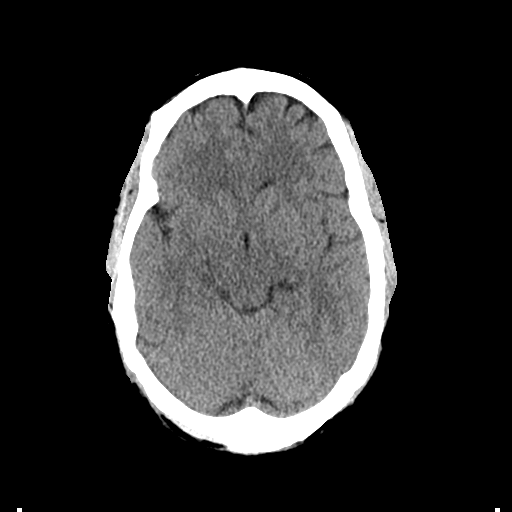
[im 17/34  brain]
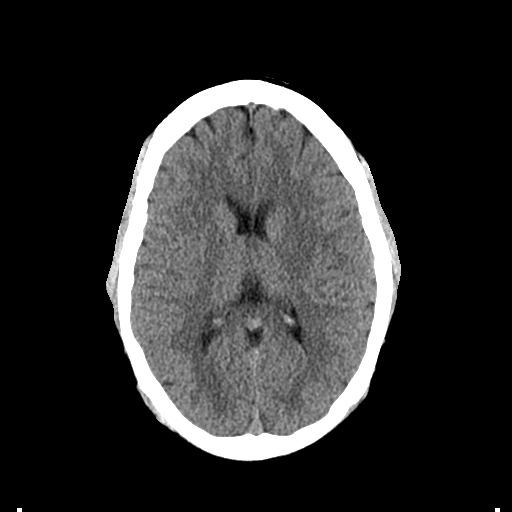
[im 21/34  brain]
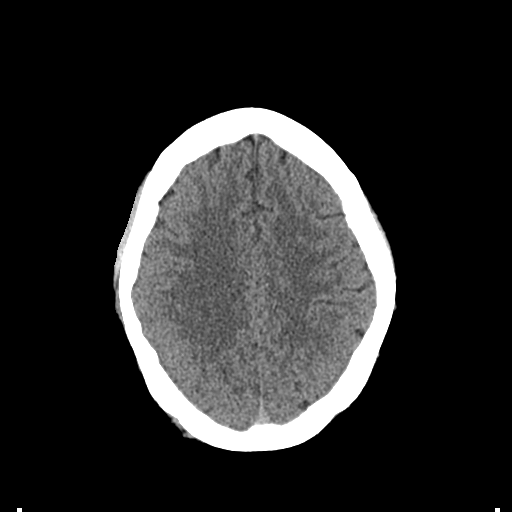
[im 21/34  bone]
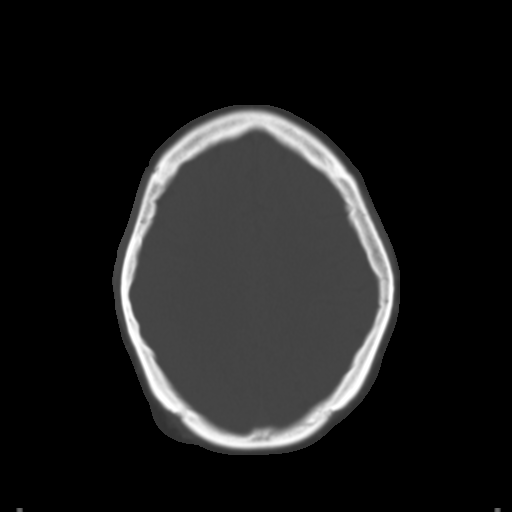
[im 25/34  brain]
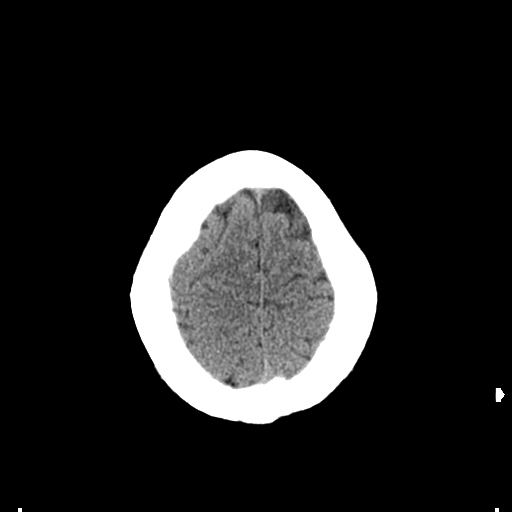
[im 29/34  brain]
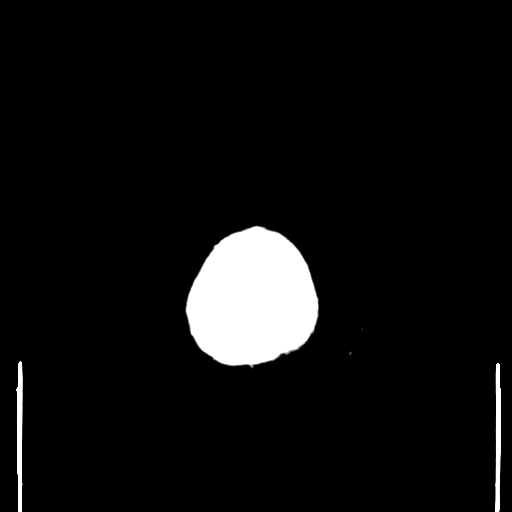

[Series 3: head bone · axial · 0.47mm/px · z∈[-129,-71]mm · 4 of 85 slices shown]
[im 9/85  bone]
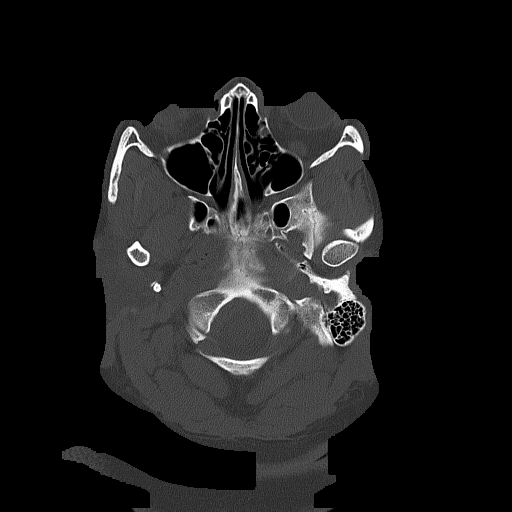
[im 17/85  bone]
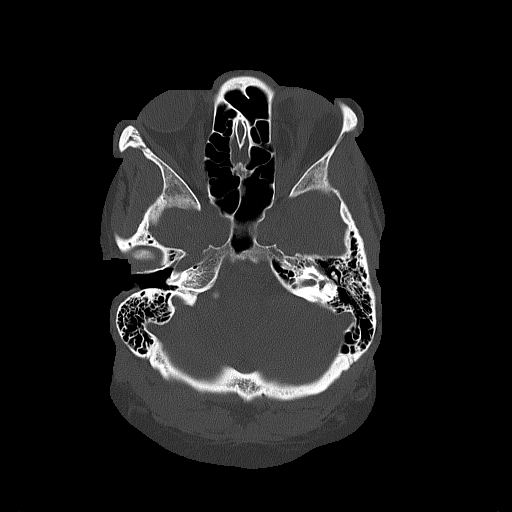
[im 26/85  bone]
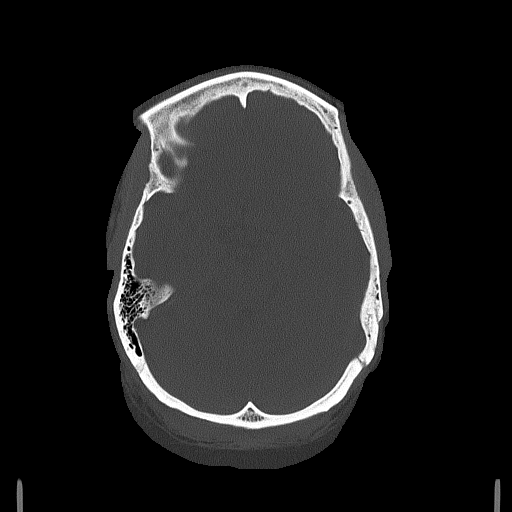
[im 38/85  bone]
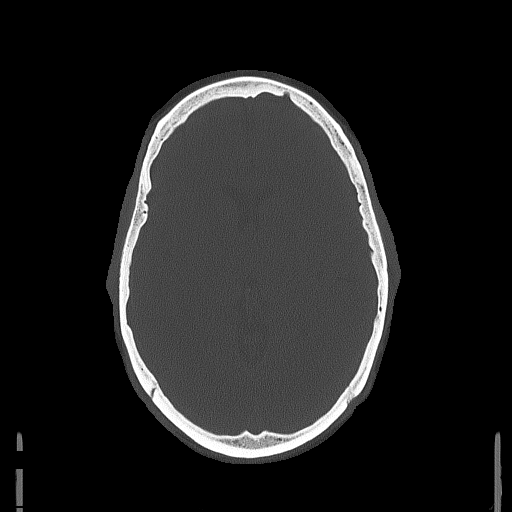

[Series 4: coronal soft tissue · coronal · 0.34mm/px · 3 of 71 slices shown]
[im 24/71  brain]
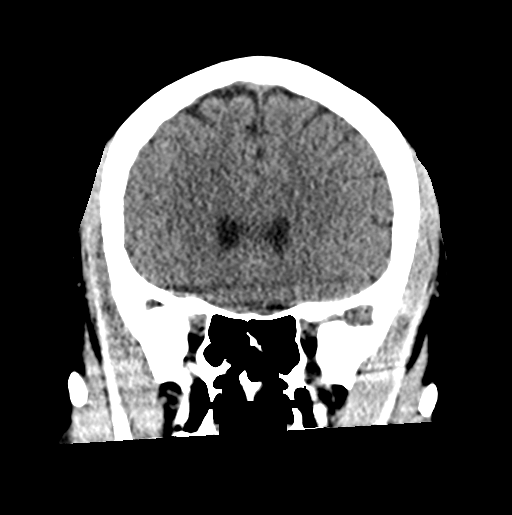
[im 32/71  brain]
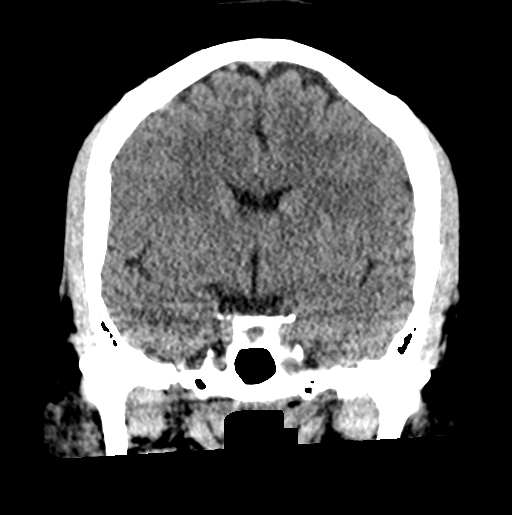
[im 39/71  brain]
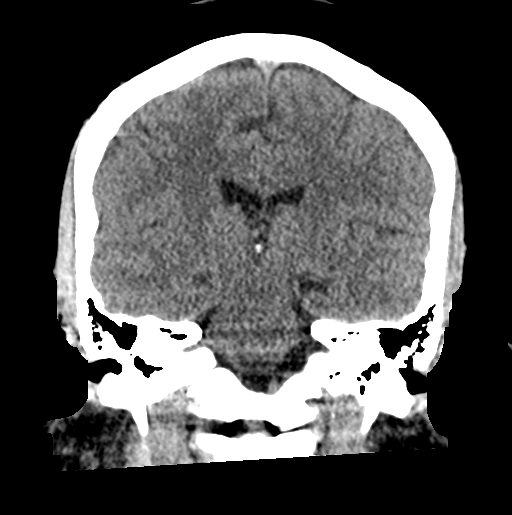

[Series 5: sagittal soft tissue · sagittal · 0.37mm/px · 3 of 59 slices shown]
[im 20/59  brain]
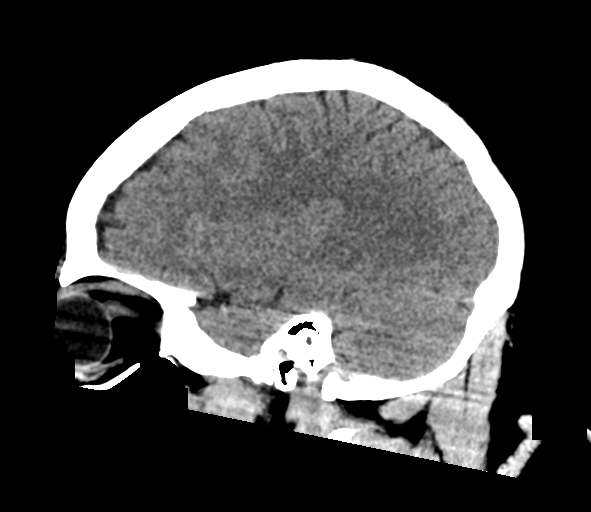
[im 30/59  brain]
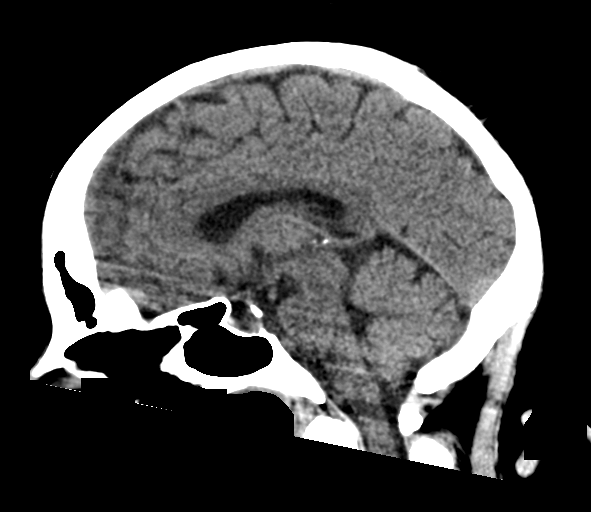
[im 39/59  brain]
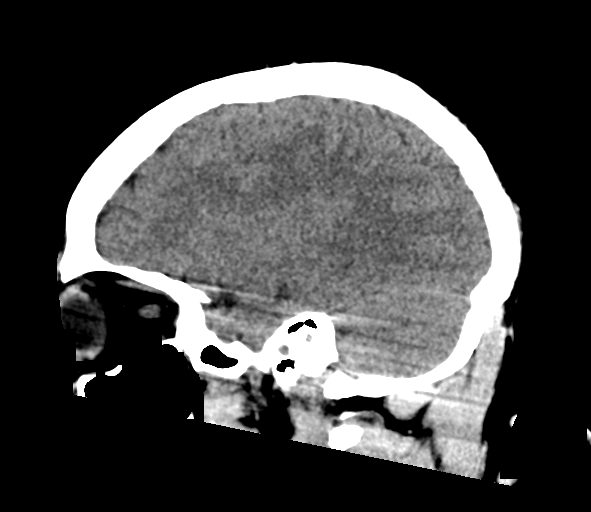

[17 of 47 positions shown; findings below may reference images not displayed]

FINDINGS: Brain: No acute intracranial abnormality. Specifically, no
hemorrhage, hydrocephalus, mass lesion, acute infarction, or
significant intracranial injury.

Vascular: No hyperdense vessel or unexpected calcification.

Skull: No acute calvarial abnormality.

Sinuses/Orbits: No acute findings

Other: None
IMPRESSION: Normal study.

## 2024-04-10 ENCOUNTER — Encounter: Payer: Self-pay | Admitting: Oncology
